# Patient Record
Sex: Male | Born: 1950
Health system: Southern US, Community
[De-identification: ages and names within clinical notes are randomized; demographics above are authoritative.]

## PROBLEM LIST (undated history)

## (undated) DIAGNOSIS — D649 Anemia, unspecified: Secondary | ICD-10-CM

## (undated) DIAGNOSIS — N189 Chronic kidney disease, unspecified: Secondary | ICD-10-CM

## (undated) DIAGNOSIS — D485 Neoplasm of uncertain behavior of skin: Secondary | ICD-10-CM

## (undated) DIAGNOSIS — F528 Other sexual dysfunction not due to a substance or known physiological condition: Secondary | ICD-10-CM

## (undated) DIAGNOSIS — R011 Cardiac murmur, unspecified: Secondary | ICD-10-CM

## (undated) DIAGNOSIS — E119 Type 2 diabetes mellitus without complications: Secondary | ICD-10-CM

## (undated) DIAGNOSIS — K219 Gastro-esophageal reflux disease without esophagitis: Secondary | ICD-10-CM

## (undated) DIAGNOSIS — M722 Plantar fascial fibromatosis: Secondary | ICD-10-CM

## (undated) DIAGNOSIS — J449 Chronic obstructive pulmonary disease, unspecified: Secondary | ICD-10-CM

## (undated) DIAGNOSIS — M1990 Unspecified osteoarthritis, unspecified site: Secondary | ICD-10-CM

## (undated) DIAGNOSIS — L259 Unspecified contact dermatitis, unspecified cause: Secondary | ICD-10-CM

## (undated) DIAGNOSIS — I10 Essential (primary) hypertension: Secondary | ICD-10-CM

## (undated) DIAGNOSIS — T881XXA Other complications following immunization, not elsewhere classified, initial encounter: Secondary | ICD-10-CM

## (undated) DIAGNOSIS — T7840XA Allergy, unspecified, initial encounter: Secondary | ICD-10-CM

## (undated) DIAGNOSIS — J45909 Unspecified asthma, uncomplicated: Secondary | ICD-10-CM

## (undated) DIAGNOSIS — J309 Allergic rhinitis, unspecified: Secondary | ICD-10-CM

## (undated) DIAGNOSIS — G4730 Sleep apnea, unspecified: Secondary | ICD-10-CM

## (undated) DIAGNOSIS — E785 Hyperlipidemia, unspecified: Secondary | ICD-10-CM

## (undated) HISTORY — PX: KNEE ARTHROSCOPY: SHX127

## (undated) HISTORY — DX: Plantar fascial fibromatosis: M72.2

## (undated) HISTORY — DX: Other complications following immunization, not elsewhere classified, initial encounter: T88.1XXA

## (undated) HISTORY — DX: Allergy, unspecified, initial encounter: T78.40XA

## (undated) HISTORY — DX: Sleep apnea, unspecified: G47.30

## (undated) HISTORY — DX: Unspecified osteoarthritis, unspecified site: M19.90

## (undated) HISTORY — DX: Unspecified asthma, uncomplicated: J45.909

## (undated) HISTORY — DX: Type 2 diabetes mellitus without complications: E11.9

## (undated) HISTORY — DX: Chronic kidney disease, unspecified: N18.9

## (undated) HISTORY — DX: Chronic obstructive pulmonary disease, unspecified: J44.9

## (undated) HISTORY — DX: Unspecified contact dermatitis, unspecified cause: L25.9

## (undated) HISTORY — DX: Neoplasm of uncertain behavior of skin: D48.5

## (undated) HISTORY — DX: Gastro-esophageal reflux disease without esophagitis: K21.9

## (undated) HISTORY — DX: Anemia, unspecified: D64.9

## (undated) HISTORY — DX: Other sexual dysfunction not due to a substance or known physiological condition: F52.8

## (undated) HISTORY — DX: Cardiac murmur, unspecified: R01.1

## (undated) HISTORY — DX: Allergic rhinitis, unspecified: J30.9

## (undated) HISTORY — DX: Essential (primary) hypertension: I10

## (undated) HISTORY — DX: Hyperlipidemia, unspecified: E78.5

## (undated) HISTORY — PX: WISDOM TOOTH EXTRACTION: SHX21

---
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
## 2005-11-21 ENCOUNTER — Encounter (INDEPENDENT_AMBULATORY_CARE_PROVIDER_SITE_OTHER): Payer: Self-pay | Admitting: Internal Medicine

## 2005-11-21 LAB — CONVERTED CEMR LAB: PSA: 0.83 ng/mL

## 2005-11-22 ENCOUNTER — Encounter (INDEPENDENT_AMBULATORY_CARE_PROVIDER_SITE_OTHER): Payer: Self-pay | Admitting: Internal Medicine

## 2006-02-23 ENCOUNTER — Encounter (INDEPENDENT_AMBULATORY_CARE_PROVIDER_SITE_OTHER): Payer: Self-pay | Admitting: Internal Medicine

## 2006-02-24 ENCOUNTER — Encounter (INDEPENDENT_AMBULATORY_CARE_PROVIDER_SITE_OTHER): Payer: Self-pay | Admitting: Internal Medicine

## 2006-03-05 ENCOUNTER — Ambulatory Visit: Payer: Self-pay | Admitting: Internal Medicine

## 2006-04-09 ENCOUNTER — Ambulatory Visit: Payer: Self-pay | Admitting: Internal Medicine

## 2006-05-21 ENCOUNTER — Ambulatory Visit: Payer: Self-pay | Admitting: Internal Medicine

## 2006-07-02 ENCOUNTER — Ambulatory Visit: Payer: Self-pay | Admitting: Internal Medicine

## 2006-07-02 LAB — CONVERTED CEMR LAB
ALT: 22 units/L
Basophils Absolute: 0 10*3/uL
Basophils Relative: 1 %
CO2: 22 meq/L
Chloride: 102 meq/L
Cholesterol: 151 mg/dL
Eosinophils Absolute: 0.2 10*3/uL
Lymphs Abs: 1.8 10*3/uL
MCV: 94.2 fL
Neutrophils Relative %: 58 %
Platelets: 261 10*3/uL
Sodium: 140 meq/L
Total Bilirubin: 0.5 mg/dL
Total Protein: 6.7 g/dL
Triglycerides: 123 mg/dL
VLDL: 25 mg/dL
WBC: 5.9 10*3/uL

## 2006-08-07 ENCOUNTER — Encounter: Payer: Self-pay | Admitting: Internal Medicine

## 2006-08-07 DIAGNOSIS — E785 Hyperlipidemia, unspecified: Secondary | ICD-10-CM | POA: Insufficient documentation

## 2006-08-07 DIAGNOSIS — E1165 Type 2 diabetes mellitus with hyperglycemia: Secondary | ICD-10-CM | POA: Insufficient documentation

## 2006-08-07 DIAGNOSIS — E119 Type 2 diabetes mellitus without complications: Secondary | ICD-10-CM

## 2006-08-07 DIAGNOSIS — I10 Essential (primary) hypertension: Secondary | ICD-10-CM

## 2006-08-07 DIAGNOSIS — M1990 Unspecified osteoarthritis, unspecified site: Secondary | ICD-10-CM | POA: Insufficient documentation

## 2006-08-07 HISTORY — DX: Type 2 diabetes mellitus without complications: E11.9

## 2006-08-07 HISTORY — DX: Unspecified osteoarthritis, unspecified site: M19.90

## 2006-08-07 HISTORY — DX: Hyperlipidemia, unspecified: E78.5

## 2006-08-07 HISTORY — DX: Essential (primary) hypertension: I10

## 2006-08-13 ENCOUNTER — Ambulatory Visit: Payer: Self-pay | Admitting: Internal Medicine

## 2006-08-13 LAB — CONVERTED CEMR LAB: Hgb A1c MFr Bld: 6 %

## 2006-09-24 ENCOUNTER — Ambulatory Visit: Payer: Self-pay | Admitting: Internal Medicine

## 2006-11-12 ENCOUNTER — Ambulatory Visit: Payer: Self-pay | Admitting: Internal Medicine

## 2006-11-12 DIAGNOSIS — M722 Plantar fascial fibromatosis: Secondary | ICD-10-CM

## 2006-11-12 DIAGNOSIS — F528 Other sexual dysfunction not due to a substance or known physiological condition: Secondary | ICD-10-CM

## 2006-11-12 DIAGNOSIS — J309 Allergic rhinitis, unspecified: Secondary | ICD-10-CM

## 2006-11-12 HISTORY — DX: Allergic rhinitis, unspecified: J30.9

## 2006-11-12 HISTORY — DX: Plantar fascial fibromatosis: M72.2

## 2006-11-12 HISTORY — DX: Other sexual dysfunction not due to a substance or known physiological condition: F52.8

## 2006-11-16 ENCOUNTER — Encounter (INDEPENDENT_AMBULATORY_CARE_PROVIDER_SITE_OTHER): Payer: Self-pay | Admitting: Internal Medicine

## 2006-11-23 ENCOUNTER — Telehealth (INDEPENDENT_AMBULATORY_CARE_PROVIDER_SITE_OTHER): Payer: Self-pay | Admitting: *Deleted

## 2007-02-09 ENCOUNTER — Encounter (INDEPENDENT_AMBULATORY_CARE_PROVIDER_SITE_OTHER): Payer: Self-pay | Admitting: Internal Medicine

## 2007-02-10 ENCOUNTER — Ambulatory Visit: Payer: Self-pay | Admitting: Internal Medicine

## 2007-02-10 LAB — CONVERTED CEMR LAB: Hgb A1c MFr Bld: 6.7 %

## 2007-03-16 ENCOUNTER — Ambulatory Visit: Payer: Self-pay | Admitting: Internal Medicine

## 2007-03-17 ENCOUNTER — Encounter (INDEPENDENT_AMBULATORY_CARE_PROVIDER_SITE_OTHER): Payer: Self-pay | Admitting: Internal Medicine

## 2007-04-14 ENCOUNTER — Ambulatory Visit: Payer: Self-pay | Admitting: Internal Medicine

## 2007-04-15 ENCOUNTER — Telehealth (INDEPENDENT_AMBULATORY_CARE_PROVIDER_SITE_OTHER): Payer: Self-pay | Admitting: *Deleted

## 2007-04-28 ENCOUNTER — Telehealth (INDEPENDENT_AMBULATORY_CARE_PROVIDER_SITE_OTHER): Payer: Self-pay | Admitting: Internal Medicine

## 2007-05-10 ENCOUNTER — Ambulatory Visit: Payer: Self-pay | Admitting: Internal Medicine

## 2007-05-10 LAB — CONVERTED CEMR LAB: Hgb A1c MFr Bld: 6 %

## 2007-06-09 ENCOUNTER — Ambulatory Visit: Payer: Self-pay | Admitting: Internal Medicine

## 2007-06-10 LAB — CONVERTED CEMR LAB
ALT: 19 units/L (ref 0–53)
AST: 17 units/L (ref 0–37)
Albumin: 4.2 g/dL (ref 3.5–5.2)
Alkaline Phosphatase: 57 units/L (ref 39–117)
Basophils Absolute: 0 10*3/uL (ref 0.0–0.1)
Basophils Relative: 1 % (ref 0–1)
Calcium: 9.1 mg/dL (ref 8.4–10.5)
Chloride: 106 meq/L (ref 96–112)
Creatinine, Ser: 1.06 mg/dL (ref 0.40–1.50)
Creatinine, Urine: 129.1 mg/dL
Eosinophils Absolute: 0.3 10*3/uL (ref 0.0–0.7)
LDL Cholesterol: 78 mg/dL (ref 0–99)
MCHC: 32.4 g/dL (ref 30.0–36.0)
Microalb, Ur: 1.81 mg/dL (ref 0.00–1.89)
Neutro Abs: 2.8 10*3/uL (ref 1.7–7.7)
Neutrophils Relative %: 55 % (ref 43–77)
Potassium: 4.3 meq/L (ref 3.5–5.3)
RDW: 13.8 % (ref 11.5–14.0)
Total CHOL/HDL Ratio: 2.9

## 2007-08-11 ENCOUNTER — Ambulatory Visit: Payer: Self-pay | Admitting: Internal Medicine

## 2007-08-11 LAB — CONVERTED CEMR LAB
Blood Glucose, Fingerstick: 128
Hgb A1c MFr Bld: 6.3 %

## 2007-08-23 ENCOUNTER — Ambulatory Visit: Payer: Self-pay | Admitting: Internal Medicine

## 2007-09-20 ENCOUNTER — Ambulatory Visit: Payer: Self-pay | Admitting: Internal Medicine

## 2007-09-22 ENCOUNTER — Telehealth (INDEPENDENT_AMBULATORY_CARE_PROVIDER_SITE_OTHER): Payer: Self-pay | Admitting: Internal Medicine

## 2007-10-04 ENCOUNTER — Ambulatory Visit: Payer: Self-pay | Admitting: Internal Medicine

## 2007-10-04 DIAGNOSIS — J019 Acute sinusitis, unspecified: Secondary | ICD-10-CM

## 2007-10-07 DIAGNOSIS — J45909 Unspecified asthma, uncomplicated: Secondary | ICD-10-CM

## 2007-10-07 HISTORY — DX: Unspecified asthma, uncomplicated: J45.909

## 2007-11-10 ENCOUNTER — Ambulatory Visit: Payer: Self-pay | Admitting: Internal Medicine

## 2007-11-11 ENCOUNTER — Encounter (INDEPENDENT_AMBULATORY_CARE_PROVIDER_SITE_OTHER): Payer: Self-pay | Admitting: Internal Medicine

## 2007-11-12 ENCOUNTER — Telehealth (INDEPENDENT_AMBULATORY_CARE_PROVIDER_SITE_OTHER): Payer: Self-pay | Admitting: *Deleted

## 2007-11-12 LAB — CONVERTED CEMR LAB
Alkaline Phosphatase: 49 units/L (ref 39–117)
CO2: 25 meq/L (ref 19–32)
Cholesterol: 136 mg/dL (ref 0–200)
Creatinine, Ser: 1.07 mg/dL (ref 0.40–1.50)
Glucose, Bld: 92 mg/dL (ref 70–99)
HDL: 62 mg/dL (ref >39)
LDL Cholesterol: 59 mg/dL (ref 0–99)
Sodium: 144 meq/L (ref 135–145)
Total Bilirubin: 0.4 mg/dL (ref 0.3–1.2)
Total CHOL/HDL Ratio: 2.2
Total Protein: 6.2 g/dL (ref 6.0–8.3)
Triglycerides: 73 mg/dL (ref <150)
VLDL: 15 mg/dL (ref 0–40)

## 2008-01-06 ENCOUNTER — Telehealth (INDEPENDENT_AMBULATORY_CARE_PROVIDER_SITE_OTHER): Payer: Self-pay | Admitting: *Deleted

## 2008-02-09 ENCOUNTER — Ambulatory Visit: Payer: Self-pay | Admitting: Internal Medicine

## 2008-02-24 ENCOUNTER — Telehealth (INDEPENDENT_AMBULATORY_CARE_PROVIDER_SITE_OTHER): Payer: Self-pay | Admitting: Internal Medicine

## 2008-03-24 ENCOUNTER — Telehealth (INDEPENDENT_AMBULATORY_CARE_PROVIDER_SITE_OTHER): Payer: Self-pay | Admitting: Internal Medicine

## 2008-04-12 ENCOUNTER — Encounter (INDEPENDENT_AMBULATORY_CARE_PROVIDER_SITE_OTHER): Payer: Self-pay | Admitting: Internal Medicine

## 2008-05-11 ENCOUNTER — Ambulatory Visit: Payer: Self-pay | Admitting: Internal Medicine

## 2008-05-11 DIAGNOSIS — J45909 Unspecified asthma, uncomplicated: Secondary | ICD-10-CM | POA: Insufficient documentation

## 2008-05-11 LAB — CONVERTED CEMR LAB: Blood Glucose, Fingerstick: 104

## 2008-05-12 LAB — CONVERTED CEMR LAB
Albumin: 4.2 g/dL (ref 3.5–5.2)
BUN: 23 mg/dL (ref 6–23)
CO2: 21 meq/L (ref 19–32)
Calcium: 8.8 mg/dL (ref 8.4–10.5)
Chloride: 105 meq/L (ref 96–112)
Cholesterol: 145 mg/dL (ref 0–200)
Creatinine, Ser: 0.91 mg/dL (ref 0.40–1.50)
Glucose, Bld: 80 mg/dL (ref 70–99)
HDL: 53 mg/dL (ref >39)
Potassium: 4.3 meq/L (ref 3.5–5.3)
Total CHOL/HDL Ratio: 2.7

## 2008-05-31 ENCOUNTER — Encounter (INDEPENDENT_AMBULATORY_CARE_PROVIDER_SITE_OTHER): Payer: Self-pay | Admitting: Internal Medicine

## 2008-06-19 ENCOUNTER — Ambulatory Visit: Payer: Self-pay | Admitting: Internal Medicine

## 2008-06-19 ENCOUNTER — Ambulatory Visit (HOSPITAL_COMMUNITY): Admission: RE | Admit: 2008-06-19 | Discharge: 2008-06-19 | Payer: Self-pay | Admitting: Internal Medicine

## 2008-06-19 DIAGNOSIS — M25539 Pain in unspecified wrist: Secondary | ICD-10-CM

## 2008-07-20 ENCOUNTER — Telehealth (INDEPENDENT_AMBULATORY_CARE_PROVIDER_SITE_OTHER): Payer: Self-pay | Admitting: *Deleted

## 2008-07-24 ENCOUNTER — Ambulatory Visit: Payer: Self-pay | Admitting: Internal Medicine

## 2008-08-09 ENCOUNTER — Ambulatory Visit: Payer: Self-pay | Admitting: Internal Medicine

## 2008-08-09 LAB — CONVERTED CEMR LAB: Blood Glucose, Fingerstick: 187

## 2008-08-11 ENCOUNTER — Encounter (INDEPENDENT_AMBULATORY_CARE_PROVIDER_SITE_OTHER): Payer: Self-pay | Admitting: Internal Medicine

## 2008-08-11 LAB — CONVERTED CEMR LAB
Creatinine, Urine: 33.6 mg/dL
Microalb Creat Ratio: 7.1 mg/g (ref 0.0–30.0)

## 2008-09-14 ENCOUNTER — Encounter (INDEPENDENT_AMBULATORY_CARE_PROVIDER_SITE_OTHER): Payer: Self-pay | Admitting: Internal Medicine

## 2008-11-08 ENCOUNTER — Ambulatory Visit: Payer: Self-pay | Admitting: Internal Medicine

## 2008-11-08 DIAGNOSIS — L259 Unspecified contact dermatitis, unspecified cause: Secondary | ICD-10-CM

## 2008-11-08 HISTORY — DX: Unspecified contact dermatitis, unspecified cause: L25.9

## 2008-11-08 LAB — CONVERTED CEMR LAB: Blood Glucose, Fingerstick: 121

## 2008-11-13 ENCOUNTER — Encounter (INDEPENDENT_AMBULATORY_CARE_PROVIDER_SITE_OTHER): Payer: Self-pay | Admitting: Internal Medicine

## 2008-11-14 LAB — CONVERTED CEMR LAB
AST: 15 units/L (ref 0–37)
Albumin: 3.9 g/dL (ref 3.5–5.2)
Alkaline Phosphatase: 57 units/L (ref 39–117)
BUN: 23 mg/dL (ref 6–23)
Glucose, Bld: 88 mg/dL (ref 70–99)
HDL: 65 mg/dL (ref >39)
LDL Cholesterol: 74 mg/dL (ref 0–99)
Potassium: 4.3 meq/L (ref 3.5–5.3)
Sodium: 143 meq/L (ref 135–145)
Total Bilirubin: 0.3 mg/dL (ref 0.3–1.2)
Total Protein: 6.4 g/dL (ref 6.0–8.3)
Triglycerides: 66 mg/dL (ref <150)
VLDL: 13 mg/dL (ref 0–40)

## 2009-02-07 ENCOUNTER — Ambulatory Visit: Payer: Self-pay | Admitting: Internal Medicine

## 2009-02-07 DIAGNOSIS — D485 Neoplasm of uncertain behavior of skin: Secondary | ICD-10-CM

## 2009-02-07 HISTORY — DX: Neoplasm of uncertain behavior of skin: D48.5

## 2009-02-07 LAB — CONVERTED CEMR LAB: Hgb A1c MFr Bld: 6.3 %

## 2009-02-19 ENCOUNTER — Encounter (INDEPENDENT_AMBULATORY_CARE_PROVIDER_SITE_OTHER): Payer: Self-pay | Admitting: Internal Medicine

## 2009-05-09 ENCOUNTER — Ambulatory Visit: Payer: Self-pay | Admitting: Internal Medicine

## 2009-05-18 LAB — CONVERTED CEMR LAB
Alkaline Phosphatase: 54 units/L (ref 39–117)
CO2: 27 meq/L (ref 19–32)
Cholesterol: 143 mg/dL (ref 0–200)
Creatinine, Ser: 1 mg/dL (ref 0.40–1.50)
Glucose, Bld: 84 mg/dL (ref 70–99)
HDL: 53 mg/dL (ref >39)
LDL Cholesterol: 75 mg/dL (ref 0–99)
Total Bilirubin: 0.4 mg/dL (ref 0.3–1.2)
Total CHOL/HDL Ratio: 2.7
Triglycerides: 74 mg/dL (ref <150)
VLDL: 15 mg/dL (ref 0–40)

## 2009-06-08 ENCOUNTER — Encounter (INDEPENDENT_AMBULATORY_CARE_PROVIDER_SITE_OTHER): Payer: Self-pay | Admitting: Internal Medicine

## 2009-06-20 ENCOUNTER — Ambulatory Visit: Payer: Self-pay | Admitting: Family Medicine

## 2009-08-20 ENCOUNTER — Encounter (INDEPENDENT_AMBULATORY_CARE_PROVIDER_SITE_OTHER): Payer: Self-pay | Admitting: *Deleted

## 2009-09-18 ENCOUNTER — Ambulatory Visit: Payer: Self-pay | Admitting: Family Medicine

## 2009-11-20 ENCOUNTER — Telehealth: Payer: Self-pay | Admitting: Family Medicine

## 2010-01-08 ENCOUNTER — Telehealth: Payer: Self-pay | Admitting: Family Medicine

## 2010-02-12 ENCOUNTER — Encounter: Payer: Self-pay | Admitting: Family Medicine

## 2010-03-18 ENCOUNTER — Ambulatory Visit: Payer: Self-pay | Admitting: Family Medicine

## 2010-04-05 ENCOUNTER — Ambulatory Visit: Payer: Self-pay | Admitting: Family Medicine

## 2010-04-10 LAB — CONVERTED CEMR LAB
Bilirubin, Direct: 0.1 mg/dL (ref 0.0–0.3)
CO2: 28 meq/L (ref 19–32)
Calcium: 8.7 mg/dL (ref 8.4–10.5)
Creatinine, Ser: 1 mg/dL (ref 0.4–1.5)
Creatinine,U: 115.2 mg/dL
GFR calc non Af Amer: 79.07 mL/min (ref >60)
HDL: 45.9 mg/dL (ref >39.00)
LDL Cholesterol: 75 mg/dL (ref 0–99)
Microalb Creat Ratio: 2.6 mg/g (ref 0.0–30.0)
Microalb, Ur: 3 mg/dL — ABNORMAL HIGH (ref 0.0–1.9)
Total Bilirubin: 0.6 mg/dL (ref 0.3–1.2)
Total CHOL/HDL Ratio: 3
Total Protein: 6.4 g/dL (ref 6.0–8.3)
Triglycerides: 79 mg/dL (ref 0.0–149.0)
VLDL: 15.8 mg/dL (ref 0.0–40.0)

## 2010-08-08 ENCOUNTER — Encounter: Payer: Self-pay | Admitting: Family Medicine

## 2010-08-14 ENCOUNTER — Encounter: Payer: Self-pay | Admitting: Family Medicine

## 2010-09-05 LAB — HM DIABETES FOOT EXAM

## 2010-09-09 ENCOUNTER — Ambulatory Visit: Payer: Self-pay | Admitting: Family Medicine

## 2010-09-09 DIAGNOSIS — T881XXA Other complications following immunization, not elsewhere classified, initial encounter: Secondary | ICD-10-CM | POA: Insufficient documentation

## 2010-09-09 HISTORY — DX: Other complications following immunization, not elsewhere classified, initial encounter: T88.1XXA

## 2010-09-11 ENCOUNTER — Telehealth: Payer: Self-pay | Admitting: Family Medicine

## 2010-09-11 LAB — CONVERTED CEMR LAB: Rubella: >500 intl units/mL — ABNORMAL HIGH

## 2010-09-12 ENCOUNTER — Ambulatory Visit: Payer: Self-pay | Admitting: Family Medicine

## 2010-09-16 ENCOUNTER — Telehealth: Payer: Self-pay | Admitting: Family Medicine

## 2010-11-05 NOTE — Assessment & Plan Note (Signed)
Summary: 6 month follow up/cjr---PT RSC (BMP) // RS   Vital Signs:  Patient profile:   60 year old male Weight:      249 pounds Temp:     97.7 degrees F oral BP sitting:   130 / 64  (left arm) Cuff size:   large  Vitals Entered By: Sid Falcon LPN (March 18, 2010 8:27 AM)  History of Present Illness: Here for multi-problem follow up.  Diabetes stable.  Reg eye exams.  No hx of retinopathy. Not monitoring CBGs regularly.  Last A1C 6.2%.  No hypoglycemia.  Diabetes Management History:      The patient is a 60 years old male who comes in for evaluation of DM Type 2.  He has not been enrolled in the "Diabetic Education Program".  He states understanding of dietary principles and is following his diet appropriately.  No sensory loss is reported.  Self foot exams are being performed.  He is checking home blood sugars.  He says that he is not exercising regularly.        Hypoglycemic symptoms are not occurring.  No hyperglycemic symptoms are reported.    Hypertension History:      He complains of peripheral edema, but denies headache, chest pain, palpitations, dyspnea with exertion, orthopnea, PND, neurologic problems, syncope, and side effects from treatment.        Positive major cardiovascular risk factors include male age 42 years old or older, diabetes, hyperlipidemia, and hypertension.  Negative major cardiovascular risk factors include non-tobacco-user status.        Further assessment for target organ damage reveals no history of ASHD, stroke/TIA, or peripheral vascular disease.    Lipid Management History:      Positive NCEP/ATP III risk factors include male age 37 years old or older, diabetes, and hypertension.  Negative NCEP/ATP III risk factors include non-tobacco-user status, no ASHD (atherosclerotic heart disease), no prior stroke/TIA, no peripheral vascular disease, and no history of aortic aneurysm.      Allergies: 1)  Tetracycline  Past History:  Past Medical  History: Last updated: 06/20/2009 Diabetes mellitus, type II, controlled, no complications Hyperlipidemia Hypertension Osteoarthritis Allergic rhinitis recurrent wheezing/? asthma recurrent plantar fasciitis Glaucoma suspect Heart murmur PMH reviewed for relevance  Review of Systems  The patient denies anorexia, fever, vision loss, decreased hearing, chest pain, syncope, dyspnea on exertion, peripheral edema, prolonged cough, headaches, hemoptysis, abdominal pain, melena, hematochezia, severe indigestion/heartburn, and muscle weakness.    Physical Exam  General:  Well-developed,well-nourished,in no acute distress; alert,appropriate and cooperative throughout examination Head:  Normocephalic and atraumatic without obvious abnormalities. No apparent alopecia or balding. Eyes:  No corneal or conjunctival inflammation noted. EOMI. Perrla. Funduscopic exam benign, without hemorrhages, exudates or papilledema. Vision grossly normal. Mouth:  Oral mucosa and oropharynx without lesions or exudates.  Teeth in good repair. Neck:  No deformities, masses, or tenderness noted. Lungs:  Normal respiratory effort, chest expands symmetrically. Lungs are clear to auscultation, no crackles or wheezes. Heart:  Normal rate and regular rhythm. S1 and S2 normal without gallop, murmur, click, rub or other extra sounds. Extremities:  No clubbing, cyanosis, edema, or deformity noted with normal full range of motion of all joints.    Diabetes Management Exam:    Foot Exam (with socks and/or shoes not present):       Sensory-Pinprick/Light touch:          Left medial foot (L-4): normal          Left dorsal  foot (L-5): normal          Left lateral foot (S-1): normal          Right medial foot (L-4): normal          Right dorsal foot (L-5): normal          Right lateral foot (S-1): normal       Sensory-Monofilament:          Left foot: normal          Right foot: normal       Inspection:          Left  foot: normal          Right foot: normal       Nails:          Left foot: normal          Right foot: normal    Eye Exam:       Eye Exam done elsewhere          Date: 02/06/2010          Results: normal   Impression & Recommendations:  Problem # 1:  DIABETES MELLITUS, TYPE II (ICD-250.00) schedule labs.  Discussed possible change Avandia to Actos and pt not interested. His updated medication list for this problem includes:    Avandamet 01-999 Mg Tabs (Rosiglitazone-metformin) .Marland Kitchen... 1 by mouth two times a day    Zestoretic 20-25 Mg Tabs (Lisinopril-hydrochlorothiazide) ..... Once daily    Aspirin 81 Mg Tbec (Aspirin) ..... Once daily    Exforge 10-320 Mg Tabs (Amlodipine besylate-valsartan) .Marland Kitchen... 1 by mouth once daily    Glipizide Xl 5 Mg Xr24h-tab (Glipizide) .Marland Kitchen... 1 by mouth once daily  Problem # 2:  HYPERLIPIDEMIA (ICD-272.4)  His updated medication list for this problem includes:    Lipitor 20 Mg Tabs (Atorvastatin calcium) ..... Once daily  Problem # 3:  HYPERTENSION (ICD-401.9)  His updated medication list for this problem includes:    Zestoretic 20-25 Mg Tabs (Lisinopril-hydrochlorothiazide) ..... Once daily    Exforge 10-320 Mg Tabs (Amlodipine besylate-valsartan) .Marland Kitchen... 1 by mouth once daily    Doxazosin Mesylate 4 Mg Tabs (Doxazosin mesylate) .Marland Kitchen... 1 by mouth at bedtime  Complete Medication List: 1)  Avandamet 01-999 Mg Tabs (Rosiglitazone-metformin) .Marland Kitchen.. 1 by mouth two times a day 2)  Lipitor 20 Mg Tabs (Atorvastatin calcium) .... Once daily 3)  Zestoretic 20-25 Mg Tabs (Lisinopril-hydrochlorothiazide) .... Once daily 4)  Aspirin 81 Mg Tbec (Aspirin) .... Once daily 5)  Iron 25 Mg Tabs (Iron) .... Once daily 6)  Daily Multi-vitamins/iron Tabs (Multiple vitamins-iron) .... Once daily 7)  Exforge 10-320 Mg Tabs (Amlodipine besylate-valsartan) .Marland Kitchen.. 1 by mouth once daily 8)  Doxazosin Mesylate 4 Mg Tabs (Doxazosin mesylate) .Marland Kitchen.. 1 by mouth at bedtime 9)  Advair Diskus  250-50 Mcg/dose Misc (Fluticasone-salmeterol) .Marland Kitchen.. 1 puff two times a day 10)  Glipizide Xl 5 Mg Xr24h-tab (Glipizide) .Marland Kitchen.. 1 by mouth once daily 11)  Allerx Df 4-2.5 & 8-2.5 Mg Misc (Chlorpheniramine-methscop) .... One tab two times a day 12)  Omnaris 50 Mcg/act Susp (Ciclesonide) .... One spray to each nostril in the am and pm  Diabetes Management Assessment/Plan:      The following lipid goals have been established for the patient: Total cholesterol goal of 200; LDL cholesterol goal of 100; HDL cholesterol goal of 40; Triglyceride goal of 150.    Hypertension Assessment/Plan:      The patient's hypertensive risk group is category C:  Target organ damage and/or diabetes.  His calculated 10 year risk of coronary heart disease is 11 %.  Today's blood pressure is 130/64.    Lipid Assessment/Plan:      Based on NCEP/ATP III, the patient's risk factor category is "history of diabetes".  The patient's lipid goals are as follows: Total cholesterol goal is 200; LDL cholesterol goal is 100; HDL cholesterol goal is 40; Triglyceride goal is 150.    Patient Instructions: 1)  Schedule the following labs for July: 2)  Lipid, hepatic  272.4 3)  A1C, urine microalbumin  250.00 4)  BMP  401.9 5)  Please schedule a follow-up appointment in 6 months .  6)  Check your blood sugars regularly. If your readings are usually above:140  or below 70 you should contact our office.  7)  It is important that your diabetic A1c level is checked every 3 months.  8)  See your eye doctor yearly to check for diabetic eye damage. 9)  Check your feet each night  for sore areas, calluses or signs of infection.  Prescriptions: GLIPIZIDE XL 5 MG XR24H-TAB (GLIPIZIDE) 1 by mouth once daily  #90 x 3   Entered and Authorized by:   Evelena Peat MD   Signed by:   Evelena Peat MD on 03/18/2010   Method used:   Electronically to        [facility]* (retail)       [address] [place], [place]  [address]        Ph: [phone] or [phone]       Fax: [phone]   RxID:   [id number] DOXAZOSIN MESYLATE 4 MG TABS (DOXAZOSIN MESYLATE) 1 by mouth at bedtime  #90 x 3   Entered and Authorized by:   Evelena Peat MD   Signed by:   Evelena Peat MD on 03/18/2010   Method used:   Electronically to        [facility]* (retail)       [address] [place], [place]  [address]       Ph: [phone] or [phone]       Fax: [phone]   RxID:   [id number] EXFORGE 10-320 MG TABS (AMLODIPINE BESYLATE-VALSARTAN) 1 by mouth once daily  #90 x 3   Entered and Authorized by:   Evelena Peat MD   Signed by:   Evelena Peat MD on 03/18/2010   Method used:   Electronically to        [facility]* (retail)       [address] [place], [place]  [address]       Ph: [phone] or [phone]       Fax: [phone]   RxID:   [id number] ZESTORETIC 20-25 MG TABS (LISINOPRIL-HYDROCHLOROTHIAZIDE) once daily  #90 x 3   Entered and Authorized by:   Evelena Peat MD   Signed by:   Evelena Peat MD on 03/18/2010   Method used:   Electronically to        [facility]* (retail)       [address] [place], [place]  [address]       Ph: [phone] or [phone]       Fax: [phone]   RxID:   [id number] LIPITOR 20 MG TABS (ATORVASTATIN CALCIUM) once daily  #90 x 3   Entered and Authorized by:  Evelena Peat MD   Signed by:   Evelena Peat MD on 03/18/2010   Method used:   Electronically to        [facility]* (retail)       [address] [place], [place]  [address]       Ph: [phone] or [phone]       Fax: [phone]   RxID:   [id number] AVANDAMET 01-999 MG  TABS (ROSIGLITAZONE-METFORMIN) 1 by mouth two times a day  #180 x 3   Entered and Authorized by:   Evelena Peat MD   Signed by:   Evelena Peat MD on 03/18/2010   Method used:   Electronically to        [facility]* (retail)        [address] [place], [place]  [address]       Ph: [phone] or [phone]       Fax: [phone]   RxID:   [id number]

## 2010-11-05 NOTE — Letter (Signed)
Summary: [facility]  [facility]   Imported By: Maryln Gottron 03/06/2010 15:41:35  _____________________________________________________________________  External Attachment:    Type:   Image     Comment:   External Document

## 2010-11-05 NOTE — Letter (Signed)
Summary: TB Skin Test  All     ,     Phone:   Fax:           TB Skin Test    Richard Dalton    Date TB Test Placed:  ________________  L or R forearm  TB Test Placed by:  ___________________  Lot #:  __________________        Expiration Date: _____________  Date TB Test Read:  ____________________    Result ___________MM  TB Test Read by:  _______________

## 2010-11-05 NOTE — Letter (Signed)
Summary: [facility]   Imported By: Maryln Gottron 09/11/2010 08:51:24  _____________________________________________________________________  External Attachment:    Type:   Image     Comment:   External Document

## 2010-11-05 NOTE — Progress Notes (Signed)
Summary: REFILL Lipitor X 3 months, will need OV  Phone Note Refill Request Message from:  Fax from Pharmacy  Refills Requested: Medication #1:  LIPITOR 20 MG TABS once daily [facility] [phone]     FAX---[phone]  Initial call taken by: Warnell Forester,  January 08, 2010 10:04 AM    Prescriptions: LIPITOR 20 MG TABS (ATORVASTATIN CALCIUM) once daily  #90 x 0   Entered by:   Sid Falcon LPN   Authorized by:   Evelena Peat MD   Signed by:   Sid Falcon LPN on 40/98/1191   Method used:   Electronically to        [facility]* (retail)       [address] [place], [place]  [address]       Ph: [phone] or [phone]       Fax: [phone]   RxID:   [id number]

## 2010-11-05 NOTE — Letter (Signed)
Summary: [facility]  [facility]   Imported By: Maryln Gottron 08/28/2010 14:22:48  _____________________________________________________________________  External Attachment:    Type:   Image     Comment:   External Document

## 2010-11-05 NOTE — Miscellaneous (Signed)
Summary: TB neg  Clinical Lists Changes  Observations: Added new observation of TB PPDRESULT: negative (09/12/2010 12:07) Added new observation of PPD RESULT: < 5mm (09/12/2010 12:07) Added new observation of TB-PPD RDDTE: 09/12/2010 (09/12/2010 12:07)      PPD Results    Date of reading: 09/12/2010    Results: < 5mm    Interpretation: negative Pt called from [facility], he is there working all day and it would be hard to get away to come to office for TB test read.  Per Dr Caryl Never, OK to have a nurse at [facility] read and give verbal report. Per Timoteo Ace RN, PPD neg.  Pt requested PPD report be mailed to his home, done. Sid Falcon LPN  September 12, 2010 12:10 PM

## 2010-11-05 NOTE — Progress Notes (Signed)
Summary: REFILL Avandamet refill X 6 months  Phone Note Refill Request Message from:  Fax from Pharmacy  Refills Requested: Medication #1:  AVANDAMET 01-999 MG  TABS 1 by mouth two times a day [facility] [phone]    FAX---[phone]  Initial call taken by: Warnell Forester,  November 20, 2009 11:15 AM    Prescriptions: AVANDAMET 01-999 MG  TABS (ROSIGLITAZONE-METFORMIN) 1 by mouth two times a day  #60 Tablet x 6   Entered by:   Sid Falcon LPN   Authorized by:   Evelena Peat MD   Signed by:   Sid Falcon LPN on 40/98/1191   Method used:   Electronically to        [facility]* (retail)       [address] [place], [place]  [address]       Ph: [phone] or [phone]       Fax: [phone]   RxID:   [id number]

## 2010-11-05 NOTE — Assessment & Plan Note (Signed)
Summary: Follow up/cb   Vital Signs:  Patient profile:   60 year old male Weight:      249 pounds Temp:     98.1 degrees F oral BP sitting:   140 / 60  (left arm) Cuff size:   large  Vitals Entered By: Sid Falcon LPN (September 09, 2010 8:34 AM)  History of Present Illness: Patient seen for followup multiple medical problems.  Type 2 diabetes. Well controlled. Last A1c 6 range. Blood sugar stable. Patient remains on Avandamet and reluctant to change. No side effects from medications. Last eye exam in November with question of early glaucoma.  Patient started a new job recently. Needs MMR titer. No proof of prior vaccination. Also needs PPD placed.  No prior hx of positive reaction.  Hyperlipidemia treated with Lipitor. No side effects. No history of CAD.  Hypertension. Meds reviewed. Blood pressure been well-controlled  Hypertension History:      He denies headache, chest pain, palpitations, dyspnea with exertion, orthopnea, PND, peripheral edema, visual symptoms, neurologic problems, syncope, and side effects from treatment.  He notes no problems with any antihypertensive medication side effects.        Positive major cardiovascular risk factors include male age 24 years old or older, diabetes, hyperlipidemia, and hypertension.  Negative major cardiovascular risk factors include negative family history for ischemic heart disease and non-tobacco-user status.        Further assessment for target organ damage reveals no history of ASHD, stroke/TIA, or peripheral vascular disease.    Lipid Management History:      Positive NCEP/ATP III risk factors include male age 71 years old or older, diabetes, and hypertension.  Negative NCEP/ATP III risk factors include no family history for ischemic heart disease, non-tobacco-user status, no ASHD (atherosclerotic heart disease), no prior stroke/TIA, no peripheral vascular disease, and no history of aortic aneurysm.      Allergies: 1)   Tetracycline  Past History:  Past Medical History: Last updated: 06/20/2009 Diabetes mellitus, type II, controlled, no complications Hyperlipidemia Hypertension Osteoarthritis Allergic rhinitis recurrent wheezing/? asthma recurrent plantar fasciitis Glaucoma suspect Heart murmur  Past Surgical History: Last updated: 06/20/2009 REMOVAL OF CYST ON BACK ORTHROSCOPIC SURGERY ON RIGHT KNEE Tonsillectomy/adenoidectomy  1955 wisdom teeth  Family History: Last updated: 12/08/06 Father died at 37 w/ alzheimers Mother died at 43 with DM, MVA 4 sisters- 1 w/ DM 1 brother 2 daughters  Social History: Last updated: 08/11/2007 Occupation: deck builder/second job at [employer] Married lives with wife Former Smoker quit 1988, previous 2 ppd for 15 years Alcohol use-yes 0-2 drinks, 2-4 times per month  Risk Factors: Exercise: no (2006-12-08)  Risk Factors: Smoking Status: quit (2006/12/08) Passive Smoke Exposure: no (12/08/2006) PMH-FH-SH reviewed for relevance  Review of Systems      See HPI  Physical Exam  General:  Well-developed,well-nourished,in no acute distress; alert,appropriate and cooperative throughout examination Head:  Normocephalic and atraumatic without obvious abnormalities. No apparent alopecia or balding. Ears:  moderate cerumen right canal was normal Mouth:  Oral mucosa and oropharynx without lesions or exudates.  Teeth in good repair. Neck:  No deformities, masses, or tenderness noted. Lungs:  Normal respiratory effort, chest expands symmetrically. Lungs are clear to auscultation, no crackles or wheezes. Heart:  normal rate, regular rhythm, and no murmur.   Skin:  no rashes and no suspicious lesions.   Cervical Nodes:  No lymphadenopathy noted Psych:  normally interactive, good eye contact, not anxious appearing, and not depressed appearing.  Diabetes Management Exam:    Foot Exam (with socks and/or shoes not present):        Sensory-Pinprick/Light touch:          Left medial foot (L-4): normal          Left dorsal foot (L-5): normal          Left lateral foot (S-1): normal          Right medial foot (L-4): normal          Right dorsal foot (L-5): normal          Right lateral foot (S-1): normal       Sensory-Monofilament:          Left foot: normal          Right foot: normal       Inspection:          Left foot: normal          Right foot: normal       Nails:          Left foot: normal          Right foot: normal    Eye Exam:       Eye Exam done elsewhere          Date: 08/07/2010          Results: normal          Done by: Dr Lita Mains   Impression & Recommendations:  Problem # 1:  HYPERTENSION (ICD-401.9)  His updated medication list for this problem includes:    Zestoretic 20-25 Mg Tabs (Lisinopril-hydrochlorothiazide) ..... Once daily    Exforge 10-320 Mg Tabs (Amlodipine besylate-valsartan) .Marland Kitchen... 1 by mouth once daily    Doxazosin Mesylate 4 Mg Tabs (Doxazosin mesylate) .Marland Kitchen... 1 by mouth at bedtime  Problem # 2:  DIABETES MELLITUS, TYPE II (ICD-250.00)  His updated medication list for this problem includes:    Avandamet 01-999 Mg Tabs (Rosiglitazone-metformin) .Marland Kitchen... 1 by mouth two times a day    Zestoretic 20-25 Mg Tabs (Lisinopril-hydrochlorothiazide) ..... Once daily    Aspirin 81 Mg Tbec (Aspirin) ..... Once daily    Exforge 10-320 Mg Tabs (Amlodipine besylate-valsartan) .Marland Kitchen... 1 by mouth once daily    Glipizide Xl 5 Mg Xr24h-tab (Glipizide) .Marland Kitchen... 1 by mouth once daily  Orders: TLB-A1C / Hgb A1C (Glycohemoglobin) (83036-A1C)  Problem # 3:  HYPERLIPIDEMIA (ICD-272.4)  His updated medication list for this problem includes:    Lipitor 20 Mg Tabs (Atorvastatin calcium) ..... Once daily  Problem # 4:  GENERALIZED VACCINIA AS COMP MEDICAL CARE NEC (ICD-999.0) check MMR antibodies as required by work.  Flu vaccine already given. Orders: T-Measles (Rubeola) Antibody IgG  ([id number]) T-Mumps Virus Antibody, IgM ([id number]) T-Rubella Antibody ([id number])  Complete Medication List: 1)  Avandamet 01-999 Mg Tabs (Rosiglitazone-metformin) .Marland Kitchen.. 1 by mouth two times a day 2)  Lipitor 20 Mg Tabs (Atorvastatin calcium) .... Once daily 3)  Zestoretic 20-25 Mg Tabs (Lisinopril-hydrochlorothiazide) .... Once daily 4)  Aspirin 81 Mg Tbec (Aspirin) .... Once daily 5)  Iron 25 Mg Tabs (Iron) .... Once daily 6)  Daily Multi-vitamins/iron Tabs (Multiple vitamins-iron) .... Once daily 7)  Exforge 10-320 Mg Tabs (Amlodipine besylate-valsartan) .Marland Kitchen.. 1 by mouth once daily 8)  Doxazosin Mesylate 4 Mg Tabs (Doxazosin mesylate) .Marland Kitchen.. 1 by mouth at bedtime 9)  Advair Diskus 250-50 Mcg/dose Misc (Fluticasone-salmeterol) .Marland Kitchen.. 1 puff two times a day 10)  Glipizide Xl 5 Mg Xr24h-tab (Glipizide) .Marland KitchenMarland KitchenMarland Kitchen 1  by mouth once daily 11)  Allerx Df 4-2.5 & 8-2.5 Mg Misc (Chlorpheniramine-methscop) .... One tab two times a day 12)  Omnaris 50 Mcg/act Susp (Ciclesonide) .... One spray to each nostril in the am and pm 13)  Patanase 0.6 % Soln (Olopatadine hcl) .... One spray to each nostril 2 times daily  Hypertension Assessment/Plan:      The patient's hypertensive risk group is category C: Target organ damage and/or diabetes.  His calculated 10 year risk of coronary heart disease is 14 %.  Today's blood pressure is 140/60.    Lipid Assessment/Plan:      Based on NCEP/ATP III, the patient's risk factor category is "history of diabetes".  The patient's lipid goals are as follows: Total cholesterol goal is 200; LDL cholesterol goal is 100; HDL cholesterol goal is 40; Triglyceride goal is 150.    Patient Instructions: 1)  Please schedule a follow-up appointment in 6 months .  2)  It is important that you exercise reguarly at least 20 minutes 5 times a week. If you develop chest pain, have severe difficulty breathing, or feel very tired, stop exercising immediately and seek medical attention.  3)   You need to lose weight. Consider a lower calorie diet and regular exercise.    Orders Added: 1)  T-Measles (Rubeola) Antibody IgG [[id number]] 2)  T-Mumps Virus Antibody, IgM [[id number]] 3)  T-Rubella Antibody [[id number]] 4)  TLB-A1C / Hgb A1C (Glycohemoglobin) [83036-A1C] 5)  Est. Patient Level IV [57846]   Immunizations Administered:  PPD Skin Test:    Vaccine Type: PPD    Site: left forearm    Mfr: Sanofi Pasteur    Dose: 0.1 ml    Route: ID    Given by: Sid Falcon LPN    Exp. Date: 08/08/2011    Lot #: [id number]   Immunizations Administered:  PPD Skin Test:    Vaccine Type: PPD    Site: left forearm    Mfr: Sanofi Pasteur    Dose: 0.1 ml    Route: ID    Given by: Sid Falcon LPN    Exp. Date: 08/08/2011    Lot #: [id number]  Influenza Vaccine (to be given today)

## 2010-11-05 NOTE — Progress Notes (Signed)
Summary: Pt called and said fax did not go through. Pls resend labs  Phone Note Call from Patient Call back at [facility] Phone [phone] Call back at Work Phone [phone]   Caller: Patient Summary of Call: Pt called and said that fax did not go through. Pls refax labs to [phone].   Initial call taken by: Lucy Antigua,  September 11, 2010 8:12 AM  Follow-up for Phone Call        Labs will be mailed, no faxed, pt informed on home VM Follow-up by: Sid Falcon LPN,  September 11, 2010 9:28 AM

## 2010-11-07 NOTE — Progress Notes (Signed)
Summary: Pt req alternative med for Avandamet be called in asap  Phone Note Call from Patient Call back at Home Phone [phone] Call back at Work Phone [phone]   Caller: Patient Summary of Call: Pt called and said that he is almost out of Avandamet. Pt says that this med has been taken off market and is no longer available. Needs an alternative med called in to [facility] at [address]. Pls call in asap. Pt only has enough med for a day and a half.    Initial call taken by: Lucy Antigua,  September 16, 2010 9:20 AM  Follow-up for Phone Call        change to ActoPlus met 15/1,000 mg one by mouth two times a day  refill for 6 months. Follow-up by: Evelena Peat MD,  September 16, 2010 1:16 PM  Additional Follow-up for Phone Call Additional follow up Details #1::        Pt informed Additional Follow-up by: Sid Falcon LPN,  September 16, 2010 5:27 PM    New/Updated Medications: ACTOPLUS MET XR 15-1000 MG XR24H-TAB (PIOGLITAZONE HCL-METFORMIN HCL) one tab by mouth daily Prescriptions: ACTOPLUS MET XR 15-1000 MG XR24H-TAB (PIOGLITAZONE HCL-METFORMIN HCL) one tab by mouth daily  #30 x 6   Entered by:   Sid Falcon LPN   Authorized by:   Evelena Peat MD   Signed by:   Sid Falcon LPN on 29/56/2130   Method used:   Electronically to        [facility] [address] [phone]* (retail)       [address] [place], [place]  [address]       Ph: [phone]       Fax: [phone]   RxID:   [id number]

## 2011-01-08 LAB — HM DIABETES EYE EXAM

## 2011-01-31 ENCOUNTER — Other Ambulatory Visit: Payer: Self-pay | Admitting: Family Medicine

## 2011-03-02 ENCOUNTER — Other Ambulatory Visit: Payer: Self-pay | Admitting: Family Medicine

## 2011-03-07 ENCOUNTER — Encounter: Payer: Self-pay | Admitting: Family Medicine

## 2011-03-10 ENCOUNTER — Ambulatory Visit (INDEPENDENT_AMBULATORY_CARE_PROVIDER_SITE_OTHER): Payer: BC Managed Care – PPO | Admitting: Family Medicine

## 2011-03-10 ENCOUNTER — Encounter: Payer: Self-pay | Admitting: Family Medicine

## 2011-03-10 DIAGNOSIS — E785 Hyperlipidemia, unspecified: Secondary | ICD-10-CM

## 2011-03-10 DIAGNOSIS — M1990 Unspecified osteoarthritis, unspecified site: Secondary | ICD-10-CM

## 2011-03-10 DIAGNOSIS — E119 Type 2 diabetes mellitus without complications: Secondary | ICD-10-CM

## 2011-03-10 DIAGNOSIS — I10 Essential (primary) hypertension: Secondary | ICD-10-CM

## 2011-03-10 LAB — HEPATIC FUNCTION PANEL
Albumin: 3.8 g/dL (ref 3.5–5.2)
Alkaline Phosphatase: 64 U/L (ref 39–117)
Total Protein: 6.1 g/dL (ref 6.0–8.3)

## 2011-03-10 LAB — LIPID PANEL
Cholesterol: 136 mg/dL (ref 0–200)
LDL Cholesterol: 57 mg/dL (ref 0–99)
Triglycerides: 134 mg/dL (ref 0.0–149.0)
VLDL: 26.8 mg/dL (ref 0.0–40.0)

## 2011-03-10 LAB — HEMOGLOBIN A1C: Hgb A1c MFr Bld: 6.6 % — ABNORMAL HIGH (ref 4.6–6.5)

## 2011-03-10 LAB — BASIC METABOLIC PANEL
BUN: 21 mg/dL (ref 6–23)
Chloride: 104 mEq/L (ref 96–112)
Potassium: 4 mEq/L (ref 3.5–5.1)

## 2011-03-10 MED ORDER — PIOGLITAZONE HCL-METFORMIN ER 15-1000 MG PO TB24: 1.0000 | ORAL_TABLET | ORAL | Status: DC

## 2011-03-10 NOTE — Progress Notes (Signed)
  Subjective:    Patient ID: Richard Dalton, male    DOB: 1951/02/16, 60 y.o.   MRN: [id number]  HPI Patient seen for medical followup. His chronic problems include history of obesity, type 2 diabetes, hyperlipidemia, and hypertension. He has some generalized osteoarthritis. Medications are reviewed. Compliant with all. Needs refills of Actos plus met.  Blood sugars have been generally well controlled. No history of diabetic retinopathy. Recent eye exam normal. No recent hypoglycemic symptoms.  No consistent exercise. Walks some for exercise. No orthostasis. Denies any dizziness, headaches, peripheral edema, or chest pain.   Review of Systems  Constitutional: Positive for fatigue. Negative for fever, chills, activity change, appetite change and unexpected weight change.  Respiratory: Negative for cough and shortness of breath.   Cardiovascular: Negative for chest pain, palpitations and leg swelling.  Gastrointestinal: Negative for abdominal pain.  Genitourinary: Negative for dysuria.  Neurological: Negative for dizziness, light-headedness and headaches.  Hematological: Negative for adenopathy.       Objective:   Physical Exam  Constitutional: He is oriented to person, place, and time. He appears well-developed and well-nourished. No distress.  HENT:  Mouth/Throat: Oropharynx is clear and moist.  Neck: Neck supple. No thyromegaly present.  Cardiovascular: Normal rate and regular rhythm.   Pulmonary/Chest: Effort normal and breath sounds normal. No respiratory distress. He has no wheezes. He has no rales.  Musculoskeletal: He exhibits no edema.       Feet reveal no skin lesions. Good distal foot pulses. Good capillary refill. No calluses. Normal sensation with monofilament testing  Lymphadenopathy:    He has no cervical adenopathy.  Neurological: He is alert and oriented to person, place, and time. No cranial nerve deficit.  Psychiatric: He has a normal mood and affect.           Assessment & Plan:  #1 type 2 diabetes. History of excellent control. Recheck A1c today #2 hypertension well controlled continue current medications #3 hyperlipidemia repeat lipid panel and hepatic panel today

## 2011-03-11 NOTE — Progress Notes (Signed)
Quick Note:  Pt informed ______ 

## 2011-04-01 ENCOUNTER — Other Ambulatory Visit: Payer: Self-pay | Admitting: Family Medicine

## 2011-04-03 ENCOUNTER — Other Ambulatory Visit: Payer: Self-pay | Admitting: *Deleted

## 2011-04-03 MED ORDER — ATORVASTATIN CALCIUM 20 MG PO TABS: 20.0000 mg | ORAL_TABLET | Freq: Every day | ORAL | Status: DC

## 2011-04-04 ENCOUNTER — Other Ambulatory Visit: Payer: Self-pay | Admitting: Family Medicine

## 2011-04-19 ENCOUNTER — Other Ambulatory Visit: Payer: Self-pay | Admitting: Family Medicine

## 2011-04-22 ENCOUNTER — Other Ambulatory Visit: Payer: Self-pay | Admitting: *Deleted

## 2011-04-22 MED ORDER — GLIPIZIDE ER 5 MG PO TB24: 5.0000 mg | ORAL_TABLET | Freq: Every day | ORAL | Status: DC

## 2011-04-22 NOTE — Telephone Encounter (Signed)
Refilled med

## 2011-04-29 ENCOUNTER — Other Ambulatory Visit: Payer: Self-pay | Admitting: Family Medicine

## 2011-05-06 ENCOUNTER — Other Ambulatory Visit: Payer: Self-pay | Admitting: Family Medicine

## 2011-08-01 ENCOUNTER — Other Ambulatory Visit: Payer: Self-pay | Admitting: Family Medicine

## 2011-09-09 ENCOUNTER — Ambulatory Visit (INDEPENDENT_AMBULATORY_CARE_PROVIDER_SITE_OTHER): Payer: BC Managed Care – PPO | Admitting: Family Medicine

## 2011-09-09 ENCOUNTER — Encounter: Payer: Self-pay | Admitting: Family Medicine

## 2011-09-09 DIAGNOSIS — E119 Type 2 diabetes mellitus without complications: Secondary | ICD-10-CM

## 2011-09-09 DIAGNOSIS — I10 Essential (primary) hypertension: Secondary | ICD-10-CM

## 2011-09-09 DIAGNOSIS — E785 Hyperlipidemia, unspecified: Secondary | ICD-10-CM

## 2011-09-09 NOTE — Progress Notes (Signed)
  Subjective:    Patient ID: Richard Dalton, male    DOB: [birth year], 60 y.o.   MRN: [id number]  HPI  Chronic medical problems. History of type 2 diabetes, hyperlipidemia, hypertension and osteoarthritis. Medications reviewed. Blood sugars generally well controlled. Last A1c 6.6%. No symptoms of hyperglycemia. No hypoglycemia. Patient denies any recent chest pains or dyspnea. Occasional tingling sensation right upper extremity which seems to be positional and usually transient and improved with change of position. No weakness.  Hypertension treated with multiple medications. Blood pressure is well-controlled at home. No orthostasis. No headaches.  Past Medical History  Diagnosis Date  . Neoplasm of uncertain behavior of skin 02/07/2009  . DIABETES MELLITUS, TYPE II 08/07/2006  . HYPERLIPIDEMIA 08/07/2006  . ERECTILE DYSFUNCTION 11/12/2006  . HYPERTENSION 08/07/2006  . ALLERGIC RHINITIS 11/12/2006  . Extrinsic asthma, unspecified 05/11/2008  . ECZEMA 11/08/2008  . OSTEOARTHRITIS 08/07/2006  . PLANTAR FASCIITIS 11/12/2006  . GENERALIZED VACCINIA AS COMP MEDICAL CARE NEC 09/09/2010   Past Surgical History  Procedure Date  . Knee arthroscopy     right  . Tonsillectomy and adenoidectomy 1955  . Wisdom tooth extraction     reports that he quit smoking about 24 years ago. His smoking use included Cigarettes. He has a 40 pack-year smoking history. He does not have any smokeless tobacco history on file. His alcohol and drug histories not on file. family history includes Alzheimer's disease in his father and Diabetes in his brother, daughter, mother, and sister. Allergies  Allergen Reactions  . Tetracycline     REACTION: rash    Review of Systems  Constitutional: Negative for fatigue.  Eyes: Negative for visual disturbance.  Respiratory: Negative for cough, chest tightness and shortness of breath.   Cardiovascular: Negative for chest pain, palpitations and leg swelling.  Neurological: Negative for  dizziness, syncope, weakness, light-headedness and headaches.       Objective:   Physical Exam  Constitutional: He appears well-developed and well-nourished.  Neck: Neck supple. No thyromegaly present.  Cardiovascular: Normal rate, regular rhythm and normal heart sounds.   Pulmonary/Chest: Effort normal and breath sounds normal. No respiratory distress. He has no wheezes. He has no rales.  Musculoskeletal: He exhibits no edema.       Good distal pulses both upper extremities  Lymphadenopathy:    He has no cervical adenopathy.  Neurological:       Full-strength upper extremities. Normal sensory function          Assessment & Plan:  #1 type 2 diabetes. History of good control. Recheck A1c. Work on weight loss #2 hypertension slightly elevated today. Controlled by home readings. Continue close monitoring  #3 history of hyperlipidemia. Lipids from June adequate control. We'll plan repeat in 6 months.

## 2011-09-10 NOTE — Progress Notes (Signed)
Quick Note:  Pt informed on home VM ______ 

## 2012-01-01 ENCOUNTER — Other Ambulatory Visit: Payer: Self-pay | Admitting: Family Medicine

## 2012-01-29 ENCOUNTER — Other Ambulatory Visit: Payer: Self-pay | Admitting: Family Medicine

## 2012-03-08 LAB — HM DIABETES EYE EXAM

## 2012-03-09 ENCOUNTER — Encounter: Payer: Self-pay | Admitting: Family Medicine

## 2012-03-09 ENCOUNTER — Ambulatory Visit (INDEPENDENT_AMBULATORY_CARE_PROVIDER_SITE_OTHER): Payer: BC Managed Care – PPO | Admitting: Family Medicine

## 2012-03-09 VITALS — BP 140/60 | Temp 98.5°F | Wt 260.0 lb

## 2012-03-09 DIAGNOSIS — E785 Hyperlipidemia, unspecified: Secondary | ICD-10-CM

## 2012-03-09 DIAGNOSIS — I10 Essential (primary) hypertension: Secondary | ICD-10-CM

## 2012-03-09 DIAGNOSIS — E119 Type 2 diabetes mellitus without complications: Secondary | ICD-10-CM

## 2012-03-09 LAB — BASIC METABOLIC PANEL WITH GFR
BUN: 28 mg/dL — ABNORMAL HIGH (ref 6–23)
CO2: 25 meq/L (ref 19–32)
Calcium: 8.5 mg/dL (ref 8.4–10.5)
Chloride: 108 meq/L (ref 96–112)
Creatinine, Ser: 1.2 mg/dL (ref 0.4–1.5)
GFR: 67.06 mL/min
Glucose, Bld: 131 mg/dL — ABNORMAL HIGH (ref 70–99)
Potassium: 4.3 meq/L (ref 3.5–5.1)
Sodium: 142 meq/L (ref 135–145)

## 2012-03-09 LAB — LIPID PANEL
Cholesterol: 116 mg/dL (ref 0–200)
VLDL: 18.4 mg/dL (ref 0.0–40.0)

## 2012-03-09 LAB — HEPATIC FUNCTION PANEL
ALT: 25 U/L (ref 0–53)
AST: 25 U/L (ref 0–37)
Alkaline Phosphatase: 66 U/L (ref 39–117)
Bilirubin, Direct: 0 mg/dL (ref 0.0–0.3)
Total Bilirubin: 0.7 mg/dL (ref 0.3–1.2)
Total Protein: 6.7 g/dL (ref 6.0–8.3)

## 2012-03-09 NOTE — Progress Notes (Signed)
  Subjective:    Patient ID: Richard Dalton, male    DOB: August 16, 1951, 61 y.o.   MRN: [id number]  HPI  Medical followup. Type 2 diabetes, hypertension, and hyperlipidemia. Recent eye exam no retinopathy. Blood sugars have been checked rarely. No hypoglycemia. Last A1c 6.7%.  Patient compliant with all medications. No recent dizziness. No orthostasis. Denies any chest pains. He has history of seasonal allergies and asthma which have been well controlled. He continues to see allergist.  Patient not been monitoring blood pressures. No headaches.  Past Medical History  Diagnosis Date  . Neoplasm of uncertain behavior of skin 02/07/2009  . DIABETES MELLITUS, TYPE II 08/07/2006  . HYPERLIPIDEMIA 08/07/2006  . ERECTILE DYSFUNCTION 11/12/2006  . HYPERTENSION 08/07/2006  . ALLERGIC RHINITIS 11/12/2006  . Extrinsic asthma, unspecified 05/11/2008  . ECZEMA 11/08/2008  . OSTEOARTHRITIS 08/07/2006  . PLANTAR FASCIITIS 11/12/2006  . GENERALIZED VACCINIA AS COMP MEDICAL CARE NEC 09/09/2010   Past Surgical History  Procedure Date  . Knee arthroscopy     right  . Tonsillectomy and adenoidectomy 1955  . Wisdom tooth extraction     reports that he quit smoking about 25 years ago. His smoking use included Cigarettes. He has a 40 pack-year smoking history. He does not have any smokeless tobacco history on file. His alcohol and drug histories not on file. family history includes Alzheimer's disease in his father and Diabetes in his brother, daughter, mother, and sister. Allergies  Allergen Reactions  . Tetracycline     REACTION: rash      Review of Systems  Constitutional: Negative for fatigue.  Eyes: Negative for visual disturbance.  Respiratory: Negative for cough, chest tightness and shortness of breath.   Cardiovascular: Negative for chest pain, palpitations and leg swelling.  Neurological: Negative for dizziness, syncope, weakness, light-headedness and headaches.       Objective:   Physical Exam    Constitutional: He appears well-developed and well-nourished.  HENT:  Right Ear: External ear normal.  Left Ear: External ear normal.  Mouth/Throat: Oropharynx is clear and moist.  Neck: Neck supple.       No carotid bruits noted  Cardiovascular: Normal rate and regular rhythm.   Pulmonary/Chest: Effort normal and breath sounds normal. No respiratory distress. He has no wheezes. He has no rales.  Musculoskeletal: He exhibits no edema.       No foot lesions. Normal sensory function. Good distal foot pulses. No calluses.          Assessment & Plan:  #1 type 2 diabetes. Recheck A1c. Continue current medications. #2 hypertension. Slightly elevated today. Monitor closely at home and be in touch if consistently greater than 130/80. Check basic metabolic panel #3 dyslipidemia. Check lipid and hepatic panel.

## 2012-03-10 ENCOUNTER — Other Ambulatory Visit: Payer: Self-pay | Admitting: Family Medicine

## 2012-03-10 NOTE — Progress Notes (Signed)
Quick Note:  Pt informed ______ 

## 2012-04-19 ENCOUNTER — Other Ambulatory Visit: Payer: Self-pay | Admitting: Family Medicine

## 2012-07-28 ENCOUNTER — Other Ambulatory Visit: Payer: Self-pay | Admitting: Family Medicine

## 2012-08-18 ENCOUNTER — Other Ambulatory Visit: Payer: Self-pay | Admitting: *Deleted

## 2012-08-18 MED ORDER — PIOGLITAZONE HCL-METFORMIN ER 15-1000 MG PO TB24: 15.0000 mg | ORAL_TABLET | Freq: Every day | ORAL | Status: DC

## 2012-08-18 MED ORDER — DOXAZOSIN MESYLATE 4 MG PO TABS: 4.0000 mg | ORAL_TABLET | Freq: Every day | ORAL | Status: DC

## 2012-09-08 ENCOUNTER — Ambulatory Visit (INDEPENDENT_AMBULATORY_CARE_PROVIDER_SITE_OTHER): Payer: BC Managed Care – PPO | Admitting: Family Medicine

## 2012-09-08 ENCOUNTER — Encounter: Payer: Self-pay | Admitting: Family Medicine

## 2012-09-08 VITALS — BP 142/60 | Temp 98.0°F | Wt 258.0 lb

## 2012-09-08 DIAGNOSIS — I10 Essential (primary) hypertension: Secondary | ICD-10-CM

## 2012-09-08 DIAGNOSIS — E785 Hyperlipidemia, unspecified: Secondary | ICD-10-CM

## 2012-09-08 DIAGNOSIS — E119 Type 2 diabetes mellitus without complications: Secondary | ICD-10-CM

## 2012-09-08 NOTE — Progress Notes (Signed)
  Subjective:    Patient ID: Richard Dalton, male    DOB: [birth year], 61 y.o.   MRN: [id number]  HPI  Medical followup. Patient has history of hypertension, type 2 diabetes, hyperlipidemia. Medications reviewed. Compliant with all. Blood sugars stable. No hypoglycemia. No symptoms of hyperglycemia. He is frustrated with inability to lose weight. Not consistently exercising. He is down 2 pounds from last visit. A1c's have consistently been below 7. Has seen podiatrist regularly.  Denies dizziness. No chest pains. No peripheral edema issues. Recent construction of orthotics per podiatrist. Already had flu vaccine  Past Medical History  Diagnosis Date  . Neoplasm of uncertain behavior of skin 02/07/2009  . DIABETES MELLITUS, TYPE II 08/07/2006  . HYPERLIPIDEMIA 08/07/2006  . ERECTILE DYSFUNCTION 11/12/2006  . HYPERTENSION 08/07/2006  . ALLERGIC RHINITIS 11/12/2006  . Extrinsic asthma, unspecified 05/11/2008  . ECZEMA 11/08/2008  . OSTEOARTHRITIS 08/07/2006  . PLANTAR FASCIITIS 11/12/2006  . GENERALIZED VACCINIA AS COMP MEDICAL CARE NEC 09/09/2010   Past Surgical History  Procedure Date  . Knee arthroscopy     right  . Tonsillectomy and adenoidectomy 1955  . Wisdom tooth extraction     reports that he quit smoking about 25 years ago. His smoking use included Cigarettes. He has a 40 pack-year smoking history. He does not have any smokeless tobacco history on file. His alcohol and drug histories not on file. family history includes Alzheimer's disease in his father and Diabetes in his brother, daughter, mother, and sister. Allergies  Allergen Reactions  . Tetracycline     REACTION: rash      Review of Systems  Constitutional: Negative for fatigue.  Eyes: Negative for visual disturbance.  Respiratory: Negative for cough, chest tightness and shortness of breath.   Cardiovascular: Negative for chest pain, palpitations and leg swelling.  Neurological: Negative for dizziness, syncope, weakness,  light-headedness and headaches.       Objective:   Physical Exam  Constitutional: He appears well-developed and well-nourished.  Neck: Neck supple. No thyromegaly present.  Cardiovascular: Normal rate and regular rhythm.  Exam reveals no gallop.   Pulmonary/Chest: Effort normal and breath sounds normal. No respiratory distress. He has no wheezes. He has no rales.  Musculoskeletal: He exhibits no edema.          Assessment & Plan:  #1 type 2 diabetes. History of good control. Recheck A1c. Continue followup with podiatrist. Continue weight loss efforts. If A1c climbing above 7 consider other options such as Victoza #2 hypertension. Marginal control. Continue weight loss efforts and close monitoring. #3 hyperlipidemia. Lipids were checked last visit and at goal. Continue Lipitor.

## 2012-09-08 NOTE — Patient Instructions (Addendum)
Establish regular exercise and work on additional weight loss.

## 2012-09-22 LAB — HM DIABETES EYE EXAM

## 2012-11-26 ENCOUNTER — Encounter: Payer: Self-pay | Admitting: Family

## 2012-11-26 ENCOUNTER — Ambulatory Visit (INDEPENDENT_AMBULATORY_CARE_PROVIDER_SITE_OTHER): Payer: BC Managed Care – PPO | Admitting: Family

## 2012-11-26 VITALS — BP 160/80 | Temp 98.5°F | Wt 265.0 lb

## 2012-11-26 DIAGNOSIS — L738 Other specified follicular disorders: Secondary | ICD-10-CM

## 2012-11-26 DIAGNOSIS — L739 Follicular disorder, unspecified: Secondary | ICD-10-CM

## 2012-11-26 MED ORDER — CEPHALEXIN 500 MG PO TABS: 500.0000 mg | ORAL_TABLET | Freq: Four times a day (QID) | ORAL | Status: DC

## 2012-11-26 NOTE — Patient Instructions (Addendum)
Folliculitis   Folliculitis is redness, soreness, and swelling (inflammation) of the hair follicles. This condition can occur anywhere on the body. People with weakened immune systems, diabetes, or obesity have a greater risk of getting folliculitis.  CAUSES   Bacterial infection. This is the most common cause.   Fungal infection.   Viral infection.   Contact with certain chemicals, especially oils and tars.  Long-term folliculitis can result from bacteria that live in the nostrils. The bacteria may trigger multiple outbreaks of folliculitis over time.  SYMPTOMS  Folliculitis most commonly occurs on the scalp, thighs, legs, back, buttocks, and areas where hair is shaved frequently. An early sign of folliculitis is a small, white or yellow, pus-filled, itchy lesion (pustule). These lesions appear on a red, inflamed follicle. They are usually less than 0.2 inches (5 mm) wide. When there is an infection of the follicle that goes deeper, it becomes a boil or furuncle. A group of closely packed boils creates a larger lesion (carbuncle). Carbuncles tend to occur in hairy, sweaty areas of the body.  DIAGNOSIS   Your caregiver can usually tell what is wrong by doing a physical exam. A sample may be taken from one of the lesions and tested in a lab. This can help determine what is causing your folliculitis.  TREATMENT   Treatment may include:   Applying warm compresses to the affected areas.   Taking antibiotic medicines orally or applying them to the skin.   Draining the lesions if they contain a large amount of pus or fluid.   Laser hair removal for cases of long-lasting folliculitis. This helps to prevent regrowth of the hair.  HOME CARE INSTRUCTIONS   Apply warm compresses to the affected areas as directed by your caregiver.   If antibiotics are prescribed, take them as directed. Finish them even if you start to feel better.   You may take over-the-counter medicines to relieve itching.   Do not shave  irritated skin.   Follow up with your caregiver as directed.  SEEK IMMEDIATE MEDICAL CARE IF:    You have increasing redness, swelling, or pain in the affected area.   You have a fever.  MAKE SURE YOU:   Understand these instructions.   Will watch your condition.   Will get help right away if you are not doing well or get worse.  Document Released: 12/01/2001 Document Revised: 03/23/2012 Document Reviewed: 12/23/2011  [employer] Patient Information 2013 [employer]

## 2012-11-26 NOTE — Progress Notes (Signed)
Subjective:    Patient ID: Richard Dalton, male    DOB: 09-06-1951, 62 y.o.   MRN: [id number]  HPI 62 year old white male, nonsmoker, patient of Dr. Caryl Never is in today with complaints of a rash to the left back of his head has been present several months. Reports redness, tender to touch, clear to yellow discharge at times. He's been applying Neosporin and using head and shoulders shampoo with no relief. No one else in the home has this rash.   Review of Systems  Constitutional: Negative.   Respiratory: Negative.   Cardiovascular: Negative.   Gastrointestinal: Negative.   Genitourinary: Negative.   Musculoskeletal: Negative.   Skin: Positive for rash.       scalp  Neurological: Negative.   Hematological: Negative.    Past Medical History  Diagnosis Date  . Neoplasm of uncertain behavior of skin 02/07/2009  . DIABETES MELLITUS, TYPE II 08/07/2006  . HYPERLIPIDEMIA 08/07/2006  . ERECTILE DYSFUNCTION 11/12/2006  . HYPERTENSION 08/07/2006  . ALLERGIC RHINITIS 11/12/2006  . Extrinsic asthma, unspecified 05/11/2008  . ECZEMA 11/08/2008  . OSTEOARTHRITIS 08/07/2006  . PLANTAR FASCIITIS 11/12/2006  . GENERALIZED VACCINIA AS COMP MEDICAL CARE NEC 09/09/2010    History   Social History  . Marital Status: Married    Spouse Name: N/A    Number of Children: N/A  . Years of Education: N/A   Occupational History  . Not on file.   Social History Main Topics  . Smoking status: Former Smoker -- 2.00 packs/day for 20 years    Types: Cigarettes    Quit date: 03/10/1987  . Smokeless tobacco: Not on file  . Alcohol Use: Not on file  . Drug Use: Not on file  . Sexually Active: Not on file   Other Topics Concern  . Not on file   Social History Narrative  . No narrative on file    Past Surgical History  Procedure Laterality Date  . Knee arthroscopy      right  . Tonsillectomy and adenoidectomy  1955  . Wisdom tooth extraction      Family History  Problem Relation Age of Onset  .  Diabetes Mother   . Alzheimer's disease Father     died age 57  . Diabetes Sister     4 sisters with diabetes  . Diabetes Brother   . Diabetes Daughter     Allergies  Allergen Reactions  . Tetracycline     REACTION: rash    Current Outpatient Prescriptions on File Prior to Visit  Medication Sig Dispense Refill  . aspirin 81 MG tablet Take 81 mg by mouth daily.        Marland Kitchen atorvastatin (LIPITOR) 20 MG tablet Take 20 mg by mouth daily.        Marland Kitchen doxazosin (CARDURA) 4 MG tablet Take 1 tablet (4 mg total) by mouth at bedtime.  90 tablet  3  . DYMISTA 137-50 MCG/ACT SUSP One spray to each nostril twice daily      . EXFORGE 10-320 MG per tablet TAKE ONE TABLET DAILY  90 tablet  3  . Fluticasone-Salmeterol (ADVAIR) 250-50 MCG/DOSE AEPB Inhale 1 puff into the lungs every 12 (twelve) hours.        Marland Kitchen glipiZIDE (GLUCOTROL XL) 5 MG 24 hr tablet TAKE 1 TABLET EVERY DAY  90 tablet  3  . levocetirizine (XYZAL) 5 MG tablet Take 5 mg by mouth every evening.        Marland Kitchen LIPITOR 20 MG tablet  TAKE 1 TABLET BY MOUTH DAILY  90 tablet  3  . lisinopril-hydrochlorothiazide (PRINZIDE,ZESTORETIC) 20-25 MG per tablet Take 1 tablet by mouth daily.        Marland Kitchen lisinopril-hydrochlorothiazide (PRINZIDE,ZESTORETIC) 20-25 MG per tablet TAKE 1 TABLET BY MOUTH EVERY DAY  90 tablet  3  . montelukast (SINGULAIR) 10 MG tablet Take 10 mg by mouth at bedtime.        . Pioglitazone HCl-Metformin HCl (ACTOPLUS MET XR) 15-1000 MG TB24 Take 15 mg by mouth daily.  90 tablet  3   No current facility-administered medications on file prior to visit.    BP 160/80  Temp(Src) 98.5 F (36.9 C) (Oral)  Wt 265 lb (120.203 kg)  BMI 38.02 kg/m2chart    Objective:   Physical Exam  Constitutional: He is oriented to person, place, and time. He appears well-developed and well-nourished.  Neck: Normal range of motion. Neck supple. No thyromegaly present.  Cardiovascular: Normal rate, regular rhythm and normal heart sounds.   Pulmonary/Chest:  Breath sounds normal.  Abdominal: Soft. Bowel sounds are normal.  Musculoskeletal: Normal range of motion.  Neurological: He is alert and oriented to person, place, and time. He has normal reflexes.  Skin: Skin is warm and dry. Rash noted.  2-1/2 cm circular area to the left posterior scalp is red, minimally tender to touch, clear drainage, and inflamed.  Psychiatric: He has a normal mood and affect.          Assessment & Plan:  Assessment 1. Folliculitis  Plan: Cephalexin 500 mg 1 capsule by mouth 3 times a day x7 days. Hydrocortisone ointment to the affected area twice a day x3-4 days. Patient call the office if symptoms worsen or persist. Recheck a schedule, and as needed.

## 2012-11-28 ENCOUNTER — Encounter: Payer: Self-pay | Admitting: Family

## 2012-11-29 ENCOUNTER — Telehealth: Payer: Self-pay | Admitting: Family Medicine

## 2012-11-29 NOTE — Telephone Encounter (Signed)
Yes.  We had discussed this last office visit.  Making this change might assist with weight loss Stop Glipizide.  Start Victoza 0.6 mg injection once daily for one week then 1.2 mg once daily. Pt will probably need at least nurse visit to review how to give Victoza-maybe he could get rx first.

## 2012-11-29 NOTE — Telephone Encounter (Signed)
Pt had discussion w/ you about switiching to VICTOZA from glipiZIDE (GLUCOTROL XL) 5 MG 24 hr tablet Pt would like to move forward on this and try.  Pharm: [place], [place]

## 2012-11-30 NOTE — Telephone Encounter (Signed)
Pt informed and he will come for instruction and sample pick-up Fri afternoon

## 2012-11-30 NOTE — Addendum Note (Signed)
Addended by: Melchor Amour on: 11/30/2012 10:54 AM   Modules accepted: Orders

## 2012-12-15 ENCOUNTER — Encounter: Payer: Self-pay | Admitting: Family Medicine

## 2012-12-20 ENCOUNTER — Telehealth: Payer: Self-pay | Admitting: Family Medicine

## 2012-12-20 NOTE — Telephone Encounter (Signed)
I spoke with patient and his GI symptoms are already improving somewhat. He wishes to continue on the Victoza at this point.  He will let me know if symptoms don't continue to improve.

## 2012-12-27 ENCOUNTER — Telehealth: Payer: Self-pay | Admitting: Family Medicine

## 2012-12-27 ENCOUNTER — Ambulatory Visit: Payer: BC Managed Care – PPO | Admitting: Family Medicine

## 2012-12-27 NOTE — Telephone Encounter (Signed)
Patient Information:  Caller Name: Kyian  Phone: [phone]  Patient: Richard Dalton, Richard Dalton  Gender: Male  DOB: 1951-03-04  Age: 62 Years  PCP: Evelena Peat (Family Practice)  Office Follow Up:  Does the office need to follow up with this patient?: No  Instructions For The Office: N/A  RN Note:  Patient states he is out of town and unable to get to the office.  Advised pt to call his eye care provider to get recommendation.  Call back if he can schedule appt tomorrow.  Symptoms  Reason For Call & Symptoms: Pt states he burst a blood vessel in the left eye with mild discomfort.  Pt states he is out of town today.  Reviewed Health History In EMR: Yes  Reviewed Medications In EMR: Yes  Reviewed Allergies In EMR: Yes  Reviewed Surgeries / Procedures: Yes  Date of Onset of Symptoms: 12/26/2012  Guideline(s) Used:  Eye Injury  Eye Pain  Disposition Per Guideline:   See Today in Office  Reason For Disposition Reached:   Eye pain present > 24 hours  Advice Given:  N/A  Patient Will Follow Care Advice:  YES

## 2013-01-01 ENCOUNTER — Encounter: Payer: Self-pay | Admitting: Family Medicine

## 2013-01-04 MED ORDER — LIRAGLUTIDE 18 MG/3ML ~~LOC~~ SOLN: 1.2000 mg | Freq: Every day | SUBCUTANEOUS | Status: DC

## 2013-01-04 MED ORDER — INSULIN PEN NEEDLE 31G X 8 MM MISC: Status: DC

## 2013-01-07 ENCOUNTER — Other Ambulatory Visit: Payer: Self-pay | Admitting: *Deleted

## 2013-01-07 MED ORDER — AMLODIPINE BESYLATE-VALSARTAN 10-320 MG PO TABS: ORAL_TABLET | ORAL | Status: DC

## 2013-03-03 ENCOUNTER — Other Ambulatory Visit: Payer: Self-pay | Admitting: *Deleted

## 2013-03-03 MED ORDER — LISINOPRIL-HYDROCHLOROTHIAZIDE 20-25 MG PO TABS: ORAL_TABLET | ORAL | Status: DC

## 2013-03-09 ENCOUNTER — Ambulatory Visit (INDEPENDENT_AMBULATORY_CARE_PROVIDER_SITE_OTHER): Payer: BC Managed Care – PPO | Admitting: Family Medicine

## 2013-03-09 ENCOUNTER — Encounter: Payer: Self-pay | Admitting: Family Medicine

## 2013-03-09 VITALS — BP 150/70 | HR 80 | Temp 98.5°F | Resp 20 | Wt 251.0 lb

## 2013-03-09 DIAGNOSIS — I10 Essential (primary) hypertension: Secondary | ICD-10-CM

## 2013-03-09 DIAGNOSIS — R21 Rash and other nonspecific skin eruption: Secondary | ICD-10-CM

## 2013-03-09 DIAGNOSIS — E785 Hyperlipidemia, unspecified: Secondary | ICD-10-CM

## 2013-03-09 DIAGNOSIS — E119 Type 2 diabetes mellitus without complications: Secondary | ICD-10-CM

## 2013-03-09 LAB — HEPATIC FUNCTION PANEL
ALT: 23 U/L (ref 0–53)
AST: 20 U/L (ref 0–37)
Albumin: 3.3 g/dL — ABNORMAL LOW (ref 3.5–5.2)
Total Protein: 6.4 g/dL (ref 6.0–8.3)

## 2013-03-09 LAB — LIPID PANEL
Cholesterol: 117 mg/dL (ref 0–200)
LDL Cholesterol: 61 mg/dL (ref 0–99)
Triglycerides: 72 mg/dL (ref 0.0–149.0)

## 2013-03-09 LAB — BASIC METABOLIC PANEL
BUN: 21 mg/dL (ref 6–23)
CO2: 28 mEq/L (ref 19–32)
Calcium: 8.5 mg/dL (ref 8.4–10.5)
Creatinine, Ser: 1.1 mg/dL (ref 0.4–1.5)
Glucose, Bld: 256 mg/dL — ABNORMAL HIGH (ref 70–99)
Sodium: 139 mEq/L (ref 135–145)

## 2013-03-09 LAB — HM DIABETES FOOT EXAM: HM Diabetic Foot Exam: NORMAL

## 2013-03-09 LAB — HM DIABETES EYE EXAM

## 2013-03-09 MED ORDER — DESOXIMETASONE 0.25 % EX CREA: TOPICAL_CREAM | Freq: Two times a day (BID) | CUTANEOUS | Status: DC

## 2013-03-09 NOTE — Patient Instructions (Addendum)
Touch base in 2-3 weeks if scalp rash not improving Continue weight loss efforts

## 2013-03-09 NOTE — Progress Notes (Signed)
  Subjective:    Patient ID: Richard Dalton, male    DOB: [birth year], 62 y.o.   MRN: [id number]  HPI Patient seen for followup regarding type 2 diabetes. We added Victoza several months ago. He has had some significant weight loss since then. Overall feels well. Fasting blood sugar still very considerably. No symptoms of hypoglycemia He sees ophthalmologist every 6 months. No history of retinopathy. No neuropathy history Compliant with all medications. Blood pressures been well controlled generally, though slightly elevated today.  Hyperlipidemia treated with atorvastatin. No significant myalgias. Recurrent pruritic rash occipital scalp. Using hydrocortisone cream with minimal relief.  Past Medical History  Diagnosis Date  . Neoplasm of uncertain behavior of skin 02/07/2009  . DIABETES MELLITUS, TYPE II 08/07/2006  . HYPERLIPIDEMIA 08/07/2006  . ERECTILE DYSFUNCTION 11/12/2006  . HYPERTENSION 08/07/2006  . ALLERGIC RHINITIS 11/12/2006  . Extrinsic asthma, unspecified 05/11/2008  . ECZEMA 11/08/2008  . OSTEOARTHRITIS 08/07/2006  . PLANTAR FASCIITIS 11/12/2006  . GENERALIZED VACCINIA AS COMP MEDICAL CARE NEC 09/09/2010   Past Surgical History  Procedure Laterality Date  . Knee arthroscopy      right  . Tonsillectomy and adenoidectomy  1955  . Wisdom tooth extraction      reports that he quit smoking about 26 years ago. His smoking use included Cigarettes. He has a 40 pack-year smoking history. He does not have any smokeless tobacco history on file. His alcohol and drug histories are not on file. family history includes Alzheimer's disease in his father and Diabetes in his brother, daughter, mother, and sister. Allergies  Allergen Reactions  . Tetracycline     REACTION: rash       Review of Systems  Constitutional: Negative for fatigue.  Eyes: Negative for visual disturbance.  Respiratory: Negative for cough, chest tightness and shortness of breath.   Cardiovascular: Negative for chest  pain, palpitations and leg swelling.  Neurological: Negative for dizziness, syncope, weakness, light-headedness and headaches.       Objective:   Physical Exam  Constitutional: He appears well-developed and well-nourished.  Cardiovascular: Normal rate and regular rhythm.   Pulmonary/Chest: Effort normal and breath sounds normal. No respiratory distress. He has no wheezes. He has no rales.  Musculoskeletal: He exhibits no edema.  Skin:  Feet reveal no skin lesions. Good distal foot pulses. Good capillary refill. No calluses. Normal sensation with monofilament testing  Patient is nonspecific 1 cm area of erythema mid parieto-occipital scalp. No pustules. Nontender. No vesicles. No nodules          Assessment & Plan:  #1 type 2 diabetes. History of good control. Recheck A1c #2 hypertension. Slightly elevated today. Monitor at home. Continue weight loss efforts. Reassess 6 months. If still up them consider additional medication #3 nonspecific rash scalp. Topicort 0.25% cream and touch base if not resolving in 2 weeks

## 2013-05-03 ENCOUNTER — Other Ambulatory Visit: Payer: Self-pay | Admitting: Family Medicine

## 2013-07-21 ENCOUNTER — Other Ambulatory Visit: Payer: Self-pay | Admitting: Family Medicine

## 2013-08-31 ENCOUNTER — Other Ambulatory Visit: Payer: Self-pay | Admitting: Family Medicine

## 2013-09-07 ENCOUNTER — Ambulatory Visit: Payer: BC Managed Care – PPO | Admitting: Family Medicine

## 2013-09-07 ENCOUNTER — Other Ambulatory Visit: Payer: Self-pay | Admitting: Family Medicine

## 2013-09-14 ENCOUNTER — Encounter: Payer: Self-pay | Admitting: Family Medicine

## 2013-09-14 ENCOUNTER — Ambulatory Visit (INDEPENDENT_AMBULATORY_CARE_PROVIDER_SITE_OTHER): Payer: BC Managed Care – PPO | Admitting: Family Medicine

## 2013-09-14 VITALS — BP 118/60 | HR 69 | Temp 97.5°F | Wt 250.0 lb

## 2013-09-14 DIAGNOSIS — E669 Obesity, unspecified: Secondary | ICD-10-CM

## 2013-09-14 DIAGNOSIS — E119 Type 2 diabetes mellitus without complications: Secondary | ICD-10-CM

## 2013-09-14 DIAGNOSIS — I10 Essential (primary) hypertension: Secondary | ICD-10-CM

## 2013-09-14 LAB — HEMOGLOBIN A1C: Hgb A1c MFr Bld: 7.4 % — ABNORMAL HIGH (ref 4.6–6.5)

## 2013-09-14 MED ORDER — LIRAGLUTIDE 18 MG/3ML ~~LOC~~ SOPN: 1.2000 mg | PEN_INJECTOR | Freq: Every day | SUBCUTANEOUS | Status: DC

## 2013-09-14 NOTE — Progress Notes (Signed)
Pre visit review using our clinic review tool, if applicable. No additional management support is needed unless otherwise documented below in the visit note. 

## 2013-09-14 NOTE — Patient Instructions (Signed)
Reduce Cardura to one half tablet daily for one week and then discontinue if BP < 130/85

## 2013-09-14 NOTE — Progress Notes (Signed)
   Subjective:    Patient ID: Richard Dalton, male    DOB: 07/16/1951, 62 y.o.   MRN: [id number]  HPI Patient is seen for follow up His history of obesity, osteoarthritis, hypertension, hyperlipidemia, asthma, and type 2 diabetes Last A1c 7.3%. He has lost some weight with Victoza but has plateaued with weight loss efforts. No consistent exercise. Recent eye exam unremarkable. No symptoms of hyperglycemia. Denies any hypoglycemic symptoms.  Hypertension which is been well controlled on multidrug regimen. He has recently had some orthostatic type symptoms occasionally. He does take Cardura and has not taken this for BPH symptoms.  Past Medical History  Diagnosis Date  . Neoplasm of uncertain behavior of skin 02/07/2009  . DIABETES MELLITUS, TYPE II 08/07/2006  . HYPERLIPIDEMIA 08/07/2006  . ERECTILE DYSFUNCTION 11/12/2006  . HYPERTENSION 08/07/2006  . ALLERGIC RHINITIS 11/12/2006  . Extrinsic asthma, unspecified 05/11/2008  . ECZEMA 11/08/2008  . OSTEOARTHRITIS 08/07/2006  . PLANTAR FASCIITIS 11/12/2006  . GENERALIZED VACCINIA AS COMP MEDICAL CARE NEC 09/09/2010   Past Surgical History  Procedure Laterality Date  . Knee arthroscopy      right  . Tonsillectomy and adenoidectomy  1955  . Wisdom tooth extraction      reports that he quit smoking about 26 years ago. His smoking use included Cigarettes. He has a 40 pack-year smoking history. He does not have any smokeless tobacco history on file. His alcohol and drug histories are not on file. family history includes Alzheimer's disease in his father; Diabetes in his brother, daughter, mother, and sister. Allergies  Allergen Reactions  . Tetracycline     REACTION: rash      Review of Systems  Constitutional: Negative for fatigue and unexpected weight change.  Eyes: Negative for visual disturbance.  Respiratory: Negative for cough, chest tightness and shortness of breath.   Cardiovascular: Negative for chest pain, palpitations and leg swelling.    Neurological: Negative for dizziness, syncope, weakness, light-headedness and headaches.       Objective:   Physical Exam  Constitutional: He appears well-developed and well-nourished.  Cardiovascular: Normal rate and regular rhythm.   Pulmonary/Chest: Effort normal and breath sounds normal. No respiratory distress. He has no wheezes. He has no rales.  Musculoskeletal: He exhibits no edema.  Skin:  Feet reveal no skin lesions. Good distal foot pulses. Good capillary refill. No calluses. Normal sensation with monofilament testing           Assessment & Plan:  #1 type 2 diabetes. History of marginal control. Recheck A1c. Needs to lose some weight we had a long discussion regarding this. He prefers lifestyle management is close to goal versus additional medication #2 hypertension which has been well controlled by home readings. He is describing occasional orthostatic type symptoms. Try reducing Cardura to 2 mg at night and then discontinue after one week if blood pressures remain consistently below 130/85.

## 2013-09-16 ENCOUNTER — Other Ambulatory Visit: Payer: Self-pay | Admitting: Family Medicine

## 2013-09-16 DIAGNOSIS — E119 Type 2 diabetes mellitus without complications: Secondary | ICD-10-CM

## 2013-11-07 ENCOUNTER — Encounter: Payer: Self-pay | Admitting: Family Medicine

## 2013-12-07 ENCOUNTER — Encounter: Payer: Self-pay | Admitting: Family Medicine

## 2013-12-07 ENCOUNTER — Ambulatory Visit (INDEPENDENT_AMBULATORY_CARE_PROVIDER_SITE_OTHER): Payer: BC Managed Care – PPO | Admitting: Family Medicine

## 2013-12-07 ENCOUNTER — Telehealth: Payer: Self-pay | Admitting: Family Medicine

## 2013-12-07 VITALS — BP 140/70 | HR 93 | Temp 98.7°F | Wt 250.0 lb

## 2013-12-07 DIAGNOSIS — I10 Essential (primary) hypertension: Secondary | ICD-10-CM

## 2013-12-07 DIAGNOSIS — E119 Type 2 diabetes mellitus without complications: Secondary | ICD-10-CM

## 2013-12-07 LAB — HEMOGLOBIN A1C: Hgb A1c MFr Bld: 8.2 % — ABNORMAL HIGH (ref 4.6–6.5)

## 2013-12-07 NOTE — Progress Notes (Signed)
Pre visit review using our clinic review tool, if applicable. No additional management support is needed unless otherwise documented below in the visit note. 

## 2013-12-07 NOTE — Progress Notes (Signed)
   Subjective:    Patient ID: Richard Dalton, male    DOB: [birth year], 63 y.o.   MRN: [id number]  HPI Patient here for followup regarding chronic medical problems. He has type 2 diabetes, hypertension, obesity, hyperlipidemia. Type 2 diabetes somewhat poorly controlled. Recent A1c 7.4%. He has unfortunately not lost any weight. He has recently changed job and anticipates more activity with his current job. Medications are reviewed. Compliant with all. No symptoms of polydipsia or polyuria.  Hypertension treated with medications including lisinopril, amlodipine, HCTZ. He recently discontinued Cardura. By his home readings his blood pressures been stable and consistently less than 140/90. No headaches. No dizziness. No chest pains. Hyperlipidemia treated with Lipitor. Lipids were at goal of summer. No history of CAD.  Past Medical History  Diagnosis Date  . Neoplasm of uncertain behavior of skin 02/07/2009  . DIABETES MELLITUS, TYPE II 08/07/2006  . HYPERLIPIDEMIA 08/07/2006  . ERECTILE DYSFUNCTION 11/12/2006  . HYPERTENSION 08/07/2006  . ALLERGIC RHINITIS 11/12/2006  . Extrinsic asthma, unspecified 05/11/2008  . ECZEMA 11/08/2008  . OSTEOARTHRITIS 08/07/2006  . PLANTAR FASCIITIS 11/12/2006  . GENERALIZED VACCINIA AS COMP MEDICAL CARE NEC 09/09/2010   Past Surgical History  Procedure Laterality Date  . Knee arthroscopy      right  . Tonsillectomy and adenoidectomy  1955  . Wisdom tooth extraction      reports that he quit smoking about 26 years ago. His smoking use included Cigarettes. He has a 40 pack-year smoking history. He does not have any smokeless tobacco history on file. His alcohol and drug histories are not on file. family history includes Alzheimer's disease in his father; Diabetes in his brother, daughter, mother, and sister. Allergies  Allergen Reactions  . Tetracycline     REACTION: rash      Review of Systems  Constitutional: Negative for fatigue.  Eyes: Negative for visual  disturbance.  Respiratory: Negative for cough, chest tightness and shortness of breath.   Cardiovascular: Negative for chest pain, palpitations and leg swelling.  Endocrine: Negative for polydipsia and polyuria.  Neurological: Negative for dizziness, syncope, weakness, light-headedness and headaches.       Objective:   Physical Exam  Constitutional: He is oriented to person, place, and time. He appears well-developed and well-nourished.  HENT:  Right Ear: External ear normal.  Left Ear: External ear normal.  Mouth/Throat: Oropharynx is clear and moist.  Eyes: Pupils are equal, round, and reactive to light.  Neck: Neck supple. No thyromegaly present.  Cardiovascular: Normal rate and regular rhythm.   Pulmonary/Chest: Effort normal and breath sounds normal. No respiratory distress. He has no wheezes. He has no rales.  Musculoskeletal: He exhibits no edema.  Neurological: He is alert and oriented to person, place, and time.          Assessment & Plan:  #1 hypertension. Marginal control by our readings but fairly well controlled by home readings. Needs to lose some weight. Continue current medications. #2 type 2 diabetes. History of slightly suboptimal control. Repeat A1c today. Needs to lose weight. #3 hyperlipidemia. Stable by recent labs. Continue atorvastatin

## 2013-12-07 NOTE — Telephone Encounter (Signed)
Relevant patient education assigned to patient using Emmi. ° °

## 2013-12-08 ENCOUNTER — Other Ambulatory Visit: Payer: Self-pay

## 2013-12-08 MED ORDER — CANAGLIFLOZIN 100 MG PO TABS: 100.0000 mg | ORAL_TABLET | Freq: Every day | ORAL | Status: DC

## 2013-12-09 ENCOUNTER — Telehealth: Payer: Self-pay

## 2013-12-09 NOTE — Telephone Encounter (Signed)
Relevant patient education assigned to patient using Emmi. ° °

## 2014-01-03 ENCOUNTER — Ambulatory Visit (INDEPENDENT_AMBULATORY_CARE_PROVIDER_SITE_OTHER): Payer: BC Managed Care – PPO | Admitting: Family Medicine

## 2014-01-03 ENCOUNTER — Encounter: Payer: Self-pay | Admitting: Family Medicine

## 2014-01-03 VITALS — BP 132/66 | HR 91 | Temp 98.4°F | Wt 243.0 lb

## 2014-01-03 DIAGNOSIS — R05 Cough: Secondary | ICD-10-CM

## 2014-01-03 DIAGNOSIS — R059 Cough, unspecified: Secondary | ICD-10-CM

## 2014-01-03 MED ORDER — HYDROCODONE-HOMATROPINE 5-1.5 MG/5ML PO SYRP: 5.0000 mL | ORAL_SOLUTION | Freq: Four times a day (QID) | ORAL | Status: AC | PRN

## 2014-01-03 MED ORDER — AZITHROMYCIN 250 MG PO TABS: ORAL_TABLET | ORAL | Status: AC

## 2014-01-03 NOTE — Progress Notes (Signed)
Pre visit review using our clinic review tool, if applicable. No additional management support is needed unless otherwise documented below in the visit note. 

## 2014-01-03 NOTE — Patient Instructions (Addendum)
Follow up promptly for any fever or increased shortness of breath. 

## 2014-01-03 NOTE — Progress Notes (Signed)
   Subjective:    Patient ID: Richard Dalton, male    DOB: 1951/02/20, 63 y.o.   MRN: [id number]  Cough Pertinent negatives include no chest pain, chills, fever, shortness of breath or wheezing.   Acute visit for cough and chest congestion. Onset 6 days ago. Patient nonsmoker. He's had no fever. No wheezing. No dyspnea. No body aches. Cough has been severe at night. He does have history of asthma and takes Advair regularly. His rescue inhaler which he uses occasionally. No peripheral edema issues.  He has lost about 7 pounds due to his efforts since last visit and overall feels good  Past Medical History  Diagnosis Date  . Neoplasm of uncertain behavior of skin 02/07/2009  . DIABETES MELLITUS, TYPE II 08/07/2006  . HYPERLIPIDEMIA 08/07/2006  . ERECTILE DYSFUNCTION 11/12/2006  . HYPERTENSION 08/07/2006  . ALLERGIC RHINITIS 11/12/2006  . Extrinsic asthma, unspecified 05/11/2008  . ECZEMA 11/08/2008  . OSTEOARTHRITIS 08/07/2006  . PLANTAR FASCIITIS 11/12/2006  . GENERALIZED VACCINIA AS COMP MEDICAL CARE NEC 09/09/2010   Past Surgical History  Procedure Laterality Date  . Knee arthroscopy      right  . Tonsillectomy and adenoidectomy  1955  . Wisdom tooth extraction      reports that he quit smoking about 26 years ago. His smoking use included Cigarettes. He has a 40 pack-year smoking history. He does not have any smokeless tobacco history on file. His alcohol and drug histories are not on file. family history includes Alzheimer's disease in his father; Diabetes in his brother, daughter, mother, and sister. Allergies  Allergen Reactions  . Tetracycline     REACTION: rash      Review of Systems  Constitutional: Negative for fever and chills.  HENT: Positive for congestion.   Respiratory: Positive for cough. Negative for shortness of breath and wheezing.   Cardiovascular: Negative for chest pain.       Objective:   Physical Exam  Constitutional: He appears well-developed and  well-nourished.  HENT:  Right Ear: External ear normal.  Left Ear: External ear normal.  Mouth/Throat: Oropharynx is clear and moist.  Neck: Neck supple. No thyromegaly present.  Cardiovascular: Normal rate.   Pulmonary/Chest: Effort normal and breath sounds normal. No respiratory distress. He has no wheezes.  Patient does have some faint crackles left base          Assessment & Plan:   cough. Patient does have finding of some rales in his left base with no history of known chronic rales. Does not have any fever or other suggestion of pneumonia and this may represent some atelectasis. Given his comorbidities, start Zithromax and Hycodan cough syrup for nighttime suppression as needed. Followup promptly for fever or shortness of breath

## 2014-01-04 ENCOUNTER — Encounter: Payer: Self-pay | Admitting: Family Medicine

## 2014-01-14 ENCOUNTER — Other Ambulatory Visit: Payer: Self-pay | Admitting: Family Medicine

## 2014-01-28 ENCOUNTER — Other Ambulatory Visit: Payer: Self-pay | Admitting: Family Medicine

## 2014-02-04 ENCOUNTER — Emergency Department (HOSPITAL_COMMUNITY)
Admission: EM | Admit: 2014-02-04 | Discharge: 2014-02-04 | Disposition: A | Discharge status: Home / Self Care | Payer: BC Managed Care – PPO | Source: Home / Self Care | Attending: Family Medicine | Admitting: Family Medicine

## 2014-02-04 ENCOUNTER — Encounter (HOSPITAL_COMMUNITY): Payer: Self-pay | Admitting: Emergency Medicine

## 2014-02-04 ENCOUNTER — Emergency Department (INDEPENDENT_AMBULATORY_CARE_PROVIDER_SITE_OTHER): Payer: BC Managed Care – PPO

## 2014-02-04 DIAGNOSIS — J069 Acute upper respiratory infection, unspecified: Secondary | ICD-10-CM

## 2014-02-04 MED ORDER — BENZONATATE 100 MG PO CAPS: 100.0000 mg | ORAL_CAPSULE | Freq: Three times a day (TID) | ORAL | Status: DC | PRN

## 2014-02-04 MED ORDER — IPRATROPIUM BROMIDE 0.06 % NA SOLN: 2.0000 | Freq: Four times a day (QID) | NASAL | Status: DC

## 2014-02-04 NOTE — ED Notes (Signed)
Sinus congestion, cough, green phlegm, onset 10 days ago

## 2014-02-04 NOTE — Discharge Instructions (Signed)
Your chest xray was without indication of pneumonia or severe bronchitis. Most coughs associated with a viral upper respiratory infections last about 2 weeks. I would like you to use medications for nasal drainage and cough as prescribed and follow up with your doctor if symptoms persist or you develop fever. Your evaluations does not indicate the need to take antibiotics.  Upper Respiratory Infection, Adult An upper respiratory infection (URI) is also sometimes known as the common cold. The upper respiratory tract includes the nose, sinuses, throat, trachea, and bronchi. Bronchi are the airways leading to the lungs. Most people improve within 1 week, but symptoms can last up to 2 weeks. A residual cough may last even longer.  CAUSES Many different viruses can infect the tissues lining the upper respiratory tract. The tissues become irritated and inflamed and often become very moist. Mucus production is also common. A cold is contagious. You can easily spread the virus to others by oral contact. This includes kissing, sharing a glass, coughing, or sneezing. Touching your mouth or nose and then touching a surface, which is then touched by another person, can also spread the virus. SYMPTOMS  Symptoms typically develop 1 to 3 days after you come in contact with a cold virus. Symptoms vary from person to person. They may include:  Runny nose.  Sneezing.  Nasal congestion.  Sinus irritation.  Sore throat.  Loss of voice (laryngitis).  Cough.  Fatigue.  Muscle aches.  Loss of appetite.  Headache.  Low-grade fever. DIAGNOSIS  You might diagnose your own cold based on familiar symptoms, since most people get a cold 2 to 3 times a year. Your caregiver can confirm this based on your exam. Most importantly, your caregiver can check that your symptoms are not due to another disease such as strep throat, sinusitis, pneumonia, asthma, or epiglottitis. Blood tests, throat tests, and X-rays are not  necessary to diagnose a common cold, but they may sometimes be helpful in excluding other more serious diseases. Your caregiver will decide if any further tests are required. RISKS AND COMPLICATIONS  You may be at risk for a more severe case of the common cold if you smoke cigarettes, have chronic heart disease (such as heart failure) or lung disease (such as asthma), or if you have a weakened immune system. The very young and very old are also at risk for more serious infections. Bacterial sinusitis, middle ear infections, and bacterial pneumonia can complicate the common cold. The common cold can worsen asthma and chronic obstructive pulmonary disease (COPD). Sometimes, these complications can require emergency medical care and may be life-threatening. PREVENTION  The best way to protect against getting a cold is to practice good hygiene. Avoid oral or hand contact with people with cold symptoms. Wash your hands often if contact occurs. There is no clear evidence that vitamin C, vitamin E, echinacea, or exercise reduces the chance of developing a cold. However, it is always recommended to get plenty of rest and practice good nutrition. TREATMENT  Treatment is directed at relieving symptoms. There is no cure. Antibiotics are not effective, because the infection is caused by a virus, not by bacteria. Treatment may include:  Increased fluid intake. Sports drinks offer valuable electrolytes, sugars, and fluids.  Breathing heated mist or steam (vaporizer or shower).  Eating chicken soup or other clear broths, and maintaining good nutrition.  Getting plenty of rest.  Using gargles or lozenges for comfort.  Controlling fevers with ibuprofen or acetaminophen as directed by your  caregiver.  Increasing usage of your inhaler if you have asthma. Zinc gel and zinc lozenges, taken in the first 24 hours of the common cold, can shorten the duration and lessen the severity of symptoms. Pain medicines may help  with fever, muscle aches, and throat pain. A variety of non-prescription medicines are available to treat congestion and runny nose. Your caregiver can make recommendations and may suggest nasal or lung inhalers for other symptoms.  HOME CARE INSTRUCTIONS   Only take over-the-counter or prescription medicines for pain, discomfort, or fever as directed by your caregiver.  Use a warm mist humidifier or inhale steam from a shower to increase air moisture. This may keep secretions moist and make it easier to breathe.  Drink enough water and fluids to keep your urine clear or pale yellow.  Rest as needed.  Return to work when your temperature has returned to normal or as your caregiver advises. You may need to stay home longer to avoid infecting others. You can also use a face mask and careful hand washing to prevent spread of the virus. SEEK MEDICAL CARE IF:   After the first few days, you feel you are getting worse rather than better.  You need your caregiver's advice about medicines to control symptoms.  You develop chills, worsening shortness of breath, or brown or red sputum. These may be signs of pneumonia.  You develop yellow or brown nasal discharge or pain in the face, especially when you bend forward. These may be signs of sinusitis.  You develop a fever, swollen neck glands, pain with swallowing, or white areas in the back of your throat. These may be signs of strep throat. SEEK IMMEDIATE MEDICAL CARE IF:   You have a fever.  You develop severe or persistent headache, ear pain, sinus pain, or chest pain.  You develop wheezing, a prolonged cough, cough up blood, or have a change in your usual mucus (if you have chronic lung disease).  You develop sore muscles or a stiff neck. Document Released: 03/18/2001 Document Revised: 12/15/2011 Document Reviewed: 01/24/2011 [facility] Patient Information 2014 [place], [place].

## 2014-02-04 NOTE — ED Provider Notes (Signed)
CSN: [id number]     Arrival date & time 02/04/14  1335 History   First MD Initiated Contact with Patient 02/04/14 1508     Chief Complaint  Patient presents with  . URI   (Consider location/radiation/quality/duration/timing/severity/associated sxs/prior Treatment) HPI Comments: Patient is a non-smoker.  Patient is a 63 y.o. male presenting with URI. The history is provided by the patient.  URI Presenting symptoms: congestion, cough and rhinorrhea   Presenting symptoms: no ear pain, no facial pain, no fatigue and no fever   Presenting symptoms comment:  +post nasal drainage ans copious nasal drainage States he had a sore throat on initial day of symptoms but this symptom has resolved. Severity:  Moderate Onset quality:  Gradual Duration:  10 days Progression:  Unchanged Chronicity:  New Relieved by: +reports some relief with OTC Alka Seltzer cough and cold products. Associated symptoms: no arthralgias, no headaches, no myalgias, no neck pain, no sinus pain, no sneezing, no swollen glands and no wheezing   Risk factors: no chronic respiratory disease, no immunosuppression, no recent illness, no recent travel and no sick contacts     Past Medical History  Diagnosis Date  . Neoplasm of uncertain behavior of skin 02/07/2009  . DIABETES MELLITUS, TYPE II 08/07/2006  . HYPERLIPIDEMIA 08/07/2006  . ERECTILE DYSFUNCTION 11/12/2006  . HYPERTENSION 08/07/2006  . ALLERGIC RHINITIS 11/12/2006  . Extrinsic asthma, unspecified 05/11/2008  . ECZEMA 11/08/2008  . OSTEOARTHRITIS 08/07/2006  . PLANTAR FASCIITIS 11/12/2006  . GENERALIZED VACCINIA AS COMP MEDICAL CARE NEC 09/09/2010   Past Surgical History  Procedure Laterality Date  . Knee arthroscopy      right  . Tonsillectomy and adenoidectomy  1955  . Wisdom tooth extraction     Family History  Problem Relation Age of Onset  . Diabetes Mother   . Alzheimer's disease Father     died age 52  . Diabetes Sister     4 sisters with diabetes  . Diabetes  Brother   . Diabetes Daughter    History  Substance Use Topics  . Smoking status: Former Smoker -- 2.00 packs/day for 20 years    Types: Cigarettes    Quit date: 03/10/1987  . Smokeless tobacco: Not on file  . Alcohol Use: Yes    Review of Systems  Constitutional: Negative for fever and fatigue.  HENT: Positive for congestion and rhinorrhea. Negative for ear pain and sneezing.   Eyes: Negative.   Respiratory: Positive for cough. Negative for chest tightness, shortness of breath and wheezing.   Cardiovascular: Negative.   Gastrointestinal: Negative.   Endocrine: Negative for polydipsia, polyphagia and polyuria.  Genitourinary: Negative.   Musculoskeletal: Negative for arthralgias, myalgias and neck pain.  Skin: Negative.   Neurological: Negative for headaches.    Allergies  Tetracycline  Home Medications   Prior to Admission medications   Medication Sig Start Date End Date Taking? Authorizing Provider  ACTOPLUS MET XR 15-1000 MG TB24 TAKE 1 TABLET BY MOUTH EVERY DAY 09/07/13   [name], MD  amLODipine-valsartan (EXFORGE) 10-320 MG per tablet TAKE 1 TABLET BY MOUTH EVERY DAY 01/14/14   [name], MD  aspirin 81 MG tablet Take 81 mg by mouth daily.      Historical Provider, MD  atorvastatin (LIPITOR) 20 MG tablet Take 20 mg by mouth daily. Take brand only    Historical Provider, MD  Canagliflozin (INVOKANA) 100 MG TABS Take 1 tablet (100 mg total) by mouth daily. 12/08/13   Alinda Sierras  Burchette, MD  chlorpheniramine (CHLOR-TRIMETON) 4 MG tablet Take 4 mg by mouth daily.    Historical Provider, MD  DYMISTA 137-50 MCG/ACT SUSP One spray to each nostril twice daily 02/03/12   Historical Provider, MD  Fluticasone-Salmeterol (ADVAIR) 250-50 MCG/DOSE AEPB Inhale 1 puff into the lungs every 12 (twelve) hours.      Historical Provider, MD  Insulin Pen Needle 31G X 8 MM MISC Use pen needles with Victoza daily as instructed 01/04/13   [name], MD  levocetirizine (XYZAL)  5 MG tablet Take 5 mg by mouth every evening.      Historical Provider, MD  Liraglutide (VICTOZA) 18 MG/3ML SOPN Inject 1.2 mg into the skin daily. 09/14/13   [name], MD  lisinopril-hydrochlorothiazide (PRINZIDE,ZESTORETIC) 20-25 MG per tablet TAKE 1 TABLET BY MOUTH EVERY DAY 03/03/13   [name], MD  montelukast (SINGULAIR) 10 MG tablet Take 10 mg by mouth at bedtime.      Historical Provider, MD  NOVOFINE 32G X 6 MM MISC USE ONCE DAILY AS DIRECTED    [name], MD   BP 148/52  Pulse 98  Temp(Src) 98.2 F (36.8 C) (Oral)  Resp 16  SpO2 100% Physical Exam  Nursing note and vitals reviewed. Constitutional: He is oriented to person, place, and time. He appears well-developed and well-nourished. No distress.  HENT:  Head: Normocephalic and atraumatic.  Right Ear: Hearing, tympanic membrane, external ear and ear canal normal.  Left Ear: Hearing, tympanic membrane, external ear and ear canal normal.  Nose: Rhinorrhea present.  Mouth/Throat: Uvula is midline, oropharynx is clear and moist and mucous membranes are normal. No oral lesions. No trismus in the jaw. No uvula swelling.  Eyes: Conjunctivae are normal. Right eye exhibits no discharge. Left eye exhibits no discharge. No scleral icterus.  Neck: Normal range of motion. Neck supple.  Cardiovascular: Normal rate, regular rhythm and normal heart sounds.   Pulmonary/Chest: Effort normal and breath sounds normal.  Musculoskeletal: Normal range of motion.  Lymphadenopathy:    He has no cervical adenopathy.  Neurological: He is alert and oriented to person, place, and time.  Skin: Skin is warm and dry. No rash noted.  Psychiatric: He has a normal mood and affect. His behavior is normal.    ED Course  Procedures (including critical care time) Labs Review Labs Reviewed - No data to display  Imaging Review Dg Chest 2 View  02/04/2014   CLINICAL DATA:  Productive cough for the past 10 days.  EXAM: CHEST  2 VIEW   COMPARISON:  None.  FINDINGS: Borderline enlarged cardiac silhouette. Mild linear density at the left lung base compatible with focal atelectasis or scarring. Otherwise clear lungs. Minimal diffuse peribronchial thickening. Thoracic spine degenerative changes.  IMPRESSION: Minimal bronchitic changes.   Electronically Signed   By: Enrique Sack M.D.   On: 02/04/2014 15:26     MDM   1. URI (upper respiratory infection)    CXR: unremarkable. No CAP. Likely viral URI.  Will discharge home with Atrovent nasal spray and cough suppressant and advise follow up with PCP.   [place], [place] 02/04/14 1540

## 2014-02-07 NOTE — ED Provider Notes (Signed)
Medical screening examination/treatment/procedure(s) were performed by resident physician or non-physician practitioner and as supervising physician I was immediately available for consultation/collaboration.   Pauline Good MD.   Billy Fischer, MD 02/07/14 [phone]

## 2014-03-10 ENCOUNTER — Encounter: Payer: Self-pay | Admitting: Family Medicine

## 2014-03-10 ENCOUNTER — Ambulatory Visit (INDEPENDENT_AMBULATORY_CARE_PROVIDER_SITE_OTHER): Payer: BC Managed Care – PPO | Admitting: Family Medicine

## 2014-03-10 ENCOUNTER — Other Ambulatory Visit: Payer: Self-pay | Admitting: Family Medicine

## 2014-03-10 VITALS — BP 134/68 | HR 88 | Temp 98.0°F | Wt 242.0 lb

## 2014-03-10 DIAGNOSIS — E119 Type 2 diabetes mellitus without complications: Secondary | ICD-10-CM

## 2014-03-10 DIAGNOSIS — I10 Essential (primary) hypertension: Secondary | ICD-10-CM

## 2014-03-10 LAB — HEMOGLOBIN A1C: Hgb A1c MFr Bld: 7.5 % — ABNORMAL HIGH (ref 4.6–6.5)

## 2014-03-10 NOTE — Progress Notes (Signed)
   Subjective:    Patient ID: Richard Dalton, male    DOB: [birth year], 63 y.o.   MRN: [id number]  HPI Followup type 2 diabetes. Last A1c 8.2 percent. We started Invokana.  He has tolerated with no side effects. His new job involves more walking and has lost about 8 pounds. He has also made some dietary restrictions. No hyperglycemic symptoms. No symptoms of polyuria or polydipsia. Overall feels well.  Hypertension which is currently treated with lisinopril HCTZ and amlodipine valsartan combination. No dizziness. No chest pains.  Past Medical History  Diagnosis Date  . Neoplasm of uncertain behavior of skin 02/07/2009  . DIABETES MELLITUS, TYPE II 08/07/2006  . HYPERLIPIDEMIA 08/07/2006  . ERECTILE DYSFUNCTION 11/12/2006  . HYPERTENSION 08/07/2006  . ALLERGIC RHINITIS 11/12/2006  . Extrinsic asthma, unspecified 05/11/2008  . ECZEMA 11/08/2008  . OSTEOARTHRITIS 08/07/2006  . PLANTAR FASCIITIS 11/12/2006  . GENERALIZED VACCINIA AS COMP MEDICAL CARE NEC 09/09/2010   Past Surgical History  Procedure Laterality Date  . Knee arthroscopy      right  . Tonsillectomy and adenoidectomy  1955  . Wisdom tooth extraction      reports that he quit smoking about 27 years ago. His smoking use included Cigarettes. He has a 40 pack-year smoking history. He does not have any smokeless tobacco history on file. He reports that he drinks alcohol. He reports that he does not use illicit drugs. family history includes Alzheimer's disease in his father; Diabetes in his brother, daughter, mother, and sister. Allergies  Allergen Reactions  . Tetracycline     REACTION: rash      Review of Systems  Constitutional: Negative for fatigue.  Eyes: Negative for visual disturbance.  Respiratory: Negative for cough, chest tightness and shortness of breath.   Cardiovascular: Negative for chest pain, palpitations and leg swelling.  Endocrine: Negative for polydipsia and polyuria.  Neurological: Negative for dizziness, syncope,  weakness, light-headedness and headaches.       Objective:   Physical Exam  Constitutional: He appears well-developed and well-nourished.  Neck: Neck supple. No thyromegaly present.  Cardiovascular: Normal rate and regular rhythm.  Exam reveals no gallop.   Pulmonary/Chest: Effort normal and breath sounds normal. No respiratory distress. He has no wheezes. He has no rales.  Musculoskeletal: He exhibits no edema.  Psychiatric: He has a normal mood and affect. His behavior is normal.          Assessment & Plan:  #1 type 2 diabetes. Patient not monitoring blood sugars. He has lost some weight and hopefully this will be reflected with improved A1c control. Recheck A1c today. Continue weight loss efforts. Reassess 3 months #2 hypertension. Slightly improved. Continue weight loss efforts. No med changes today.

## 2014-03-10 NOTE — Progress Notes (Signed)
Pre visit review using our clinic review tool, if applicable. No additional management support is needed unless otherwise documented below in the visit note. 

## 2014-03-31 ENCOUNTER — Encounter: Payer: Self-pay | Admitting: Family Medicine

## 2014-03-31 ENCOUNTER — Ambulatory Visit (INDEPENDENT_AMBULATORY_CARE_PROVIDER_SITE_OTHER): Payer: BC Managed Care – PPO | Admitting: Family Medicine

## 2014-03-31 VITALS — BP 134/68 | HR 104 | Temp 98.5°F | Ht 70.0 in | Wt 239.0 lb

## 2014-03-31 DIAGNOSIS — J069 Acute upper respiratory infection, unspecified: Secondary | ICD-10-CM

## 2014-03-31 MED ORDER — BENZONATATE 100 MG PO CAPS: 100.0000 mg | ORAL_CAPSULE | Freq: Two times a day (BID) | ORAL | Status: DC | PRN

## 2014-03-31 NOTE — Progress Notes (Signed)
Pre visit review using our clinic review tool, if applicable. No additional management support is needed unless otherwise documented below in the visit note. 

## 2014-03-31 NOTE — Patient Instructions (Signed)
INSTRUCTIONS FOR UPPER RESPIRATORY INFECTION:  -plenty of rest and fluids  -nasal saline wash 2-3 times daily (use prepackaged nasal saline or bottled/distilled water if making your own)   -can use tylenol or ibuprofen as directed for aches and sorethroat if you are able to take these medications  -in the winter time, using a humidifier at night is helpful (please follow cleaning instructions)  -if you are taking a cough medication - use only as directed, may also try a teaspoon of honey to coat the throat and throat lozenges  -for sore throat, salt water gargles can help  -follow up if you have fevers, facial pain, tooth pain, difficulty breathing or are worsening or not getting better in 5-7 days

## 2014-03-31 NOTE — Progress Notes (Signed)
No chief complaint on file.   HPI:  -started: 2 days ago -symptoms:nasal congestion, sore throat, cough -denies:fever, SOB, NVD, tooth pain, sinus pain -has tried: alkaseltzer day and night helped - on dymista, sinugulair, xyzol  - sees Dr. Elenor Legato  -sick contacts/travel/risks: denies flu exposure, tick exposure or or Ebola risks -Hx of: allergies -tessalon perles worked great last time he was here and wonders if he can use  ROS: See pertinent positives and negatives per HPI.  Past Medical History  Diagnosis Date  . Neoplasm of uncertain behavior of skin 02/07/2009  . DIABETES MELLITUS, TYPE II 08/07/2006  . HYPERLIPIDEMIA 08/07/2006  . ERECTILE DYSFUNCTION 11/12/2006  . HYPERTENSION 08/07/2006  . ALLERGIC RHINITIS 11/12/2006  . Extrinsic asthma, unspecified 05/11/2008  . ECZEMA 11/08/2008  . OSTEOARTHRITIS 08/07/2006  . PLANTAR FASCIITIS 11/12/2006  . GENERALIZED VACCINIA AS COMP MEDICAL CARE NEC 09/09/2010    Past Surgical History  Procedure Laterality Date  . Knee arthroscopy      right  . Tonsillectomy and adenoidectomy  1955  . Wisdom tooth extraction      Family History  Problem Relation Age of Onset  . Diabetes Mother   . Alzheimer's disease Father     died age 31  . Diabetes Sister     4 sisters with diabetes  . Diabetes Brother   . Diabetes Daughter     History   Social History  . Marital Status: Married    Spouse Name: N/A    Number of Children: N/A  . Years of Education: N/A   Social History Main Topics  . Smoking status: Former Smoker -- 2.00 packs/day for 20 years    Types: Cigarettes    Quit date: 03/10/1987  . Smokeless tobacco: None  . Alcohol Use: Yes  . Drug Use: No  . Sexual Activity: None   Other Topics Concern  . None   Social History Narrative  . None    Current outpatient prescriptions:ACTOPLUS MET XR 15-1000 MG TB24, TAKE 1 TABLET BY MOUTH EVERY DAY, Disp: 90 tablet, Rfl: 2;  amLODipine-valsartan (EXFORGE) 10-320 MG per tablet, TAKE 1  TABLET BY MOUTH EVERY DAY, Disp: 90 tablet, Rfl: 0;  aspirin 81 MG tablet, Take 81 mg by mouth daily.  , Disp: , Rfl: ;  atorvastatin (LIPITOR) 20 MG tablet, Take 20 mg by mouth daily. Take brand only, Disp: , Rfl:  Canagliflozin (INVOKANA) 100 MG TABS, Take 1 tablet (100 mg total) by mouth daily., Disp: 30 tablet, Rfl: 5;  chlorpheniramine (CHLOR-TRIMETON) 4 MG tablet, Take 4 mg by mouth daily., Disp: , Rfl: ;  DYMISTA 137-50 MCG/ACT SUSP, One spray to each nostril twice daily, Disp: , Rfl: ;  Fluticasone-Salmeterol (ADVAIR) 250-50 MCG/DOSE AEPB, Inhale 1 puff into the lungs every 12 (twelve) hours.  , Disp: , Rfl:  Insulin Pen Needle 31G X 8 MM MISC, Use pen needles with Victoza daily as instructed, Disp: 100 each, Rfl: 3;  ipratropium (ATROVENT) 0.06 % nasal spray, Place 2 sprays into both nostrils 4 (four) times daily. For nasal congestion and nasal drainage., Disp: 15 mL, Rfl: 0;  levocetirizine (XYZAL) 5 MG tablet, Take 5 mg by mouth every evening.  , Disp: , Rfl:  Liraglutide (VICTOZA) 18 MG/3ML SOPN, Inject 1.2 mg into the skin daily., Disp: 9 mL, Rfl: 3;  lisinopril-hydrochlorothiazide (PRINZIDE,ZESTORETIC) 20-25 MG per tablet, TAKE 1 TABLET BY MOUTH EVERY DAY, Disp: 90 tablet, Rfl: 3;  montelukast (SINGULAIR) 10 MG tablet, Take 10 mg by mouth at  bedtime.  , Disp: , Rfl: ;  NOVOFINE 32G X 6 MM MISC, USE ONCE DAILY AS DIRECTED, Disp: 100 each, Rfl: 3  EXAM:  Filed Vitals:   03/31/14 1251  BP: 134/68  Pulse: 104  Temp: 98.5 F (36.9 C)    Body mass index is 34.29 kg/(m^2).  GENERAL: vitals reviewed and listed above, alert, oriented, appears well hydrated and in no acute distress  HEENT: atraumatic, conjunttiva clear, no obvious abnormalities on inspection of external nose and ears, normal appearance of ear canals and TMs, clear nasal congestion, mild post oropharyngeal erythema with PND, no tonsillar edema or exudate, no sinus TTP  NECK: no obvious masses on inspection  LUNGS: clear to  auscultation bilaterally, no wheezes, rales or rhonchi, good air movement  CV: HRRR, no peripheral edema  MS: moves all extremities without noticeable abnormality  PSYCH: pleasant and cooperative, no obvious depression or anxiety  ASSESSMENT AND PLAN:  Discussed the following assessment and plan:  Upper respiratory infection  -given HPI and exam findings today, a serious infection or illness is unlikely. We discussed potential etiologies, with VURI being most likely, and advised supportive care and monitoring. We discussed treatment side effects, likely course, antibiotic misuse, transmission, and signs of developing a serious illness. -of course, we advised to return or notify a doctor immediately if symptoms worsen or persist or new concerns arise.    Patient Instructions  INSTRUCTIONS FOR UPPER RESPIRATORY INFECTION:  -plenty of rest and fluids  -nasal saline wash 2-3 times daily (use prepackaged nasal saline or bottled/distilled water if making your own)   -can use sinex nasal spray for drainage and nasal congestion - but do NOT use longer then 3-4 days  -can use tylenol or ibuprofen as directed for aches and sorethroat  -in the winter time, using a humidifier at night is helpful (please follow cleaning instructions)  -if you are taking a cough medication - use only as directed, may also try a teaspoon of honey to coat the throat and throat lozenges  -for sore throat, salt water gargles can help  -follow up if you have fevers, facial pain, tooth pain, difficulty breathing or are worsening or not getting better in 5-7 days      KIM, HANNAH R.

## 2014-04-02 ENCOUNTER — Other Ambulatory Visit: Payer: Self-pay | Admitting: Family Medicine

## 2014-04-13 ENCOUNTER — Other Ambulatory Visit: Payer: Self-pay | Admitting: Family Medicine

## 2014-04-26 ENCOUNTER — Other Ambulatory Visit: Payer: Self-pay | Admitting: Family Medicine

## 2014-04-26 DIAGNOSIS — E785 Hyperlipidemia, unspecified: Secondary | ICD-10-CM

## 2014-04-26 DIAGNOSIS — E119 Type 2 diabetes mellitus without complications: Secondary | ICD-10-CM

## 2014-06-01 ENCOUNTER — Other Ambulatory Visit: Payer: Self-pay | Admitting: Family Medicine

## 2014-06-13 ENCOUNTER — Ambulatory Visit (INDEPENDENT_AMBULATORY_CARE_PROVIDER_SITE_OTHER): Payer: BC Managed Care – PPO | Admitting: Family Medicine

## 2014-06-13 ENCOUNTER — Encounter: Payer: Self-pay | Admitting: Family Medicine

## 2014-06-13 VITALS — BP 140/70 | HR 92 | Temp 98.0°F | Wt 242.0 lb

## 2014-06-13 DIAGNOSIS — E119 Type 2 diabetes mellitus without complications: Secondary | ICD-10-CM

## 2014-06-13 DIAGNOSIS — I10 Essential (primary) hypertension: Secondary | ICD-10-CM

## 2014-06-13 DIAGNOSIS — E785 Hyperlipidemia, unspecified: Secondary | ICD-10-CM

## 2014-06-13 LAB — HEPATIC FUNCTION PANEL
ALK PHOS: 78 U/L (ref 39–117)
ALT: 24 U/L (ref 0–53)
AST: 21 U/L (ref 0–37)
Albumin: 3.8 g/dL (ref 3.5–5.2)
Bilirubin, Direct: 0 mg/dL (ref 0.0–0.3)
Total Bilirubin: 0.7 mg/dL (ref 0.2–1.2)
Total Protein: 7.3 g/dL (ref 6.0–8.3)

## 2014-06-13 LAB — LIPID PANEL
CHOL/HDL RATIO: 3
Cholesterol: 148 mg/dL (ref 0–200)
HDL: 48.1 mg/dL (ref >39.00)
LDL Cholesterol: 76 mg/dL (ref 0–99)
NONHDL: 99.9
Triglycerides: 122 mg/dL (ref 0.0–149.0)
VLDL: 24.4 mg/dL (ref 0.0–40.0)

## 2014-06-13 LAB — BASIC METABOLIC PANEL
BUN: 20 mg/dL (ref 6–23)
CALCIUM: 9.1 mg/dL (ref 8.4–10.5)
CO2: 27 meq/L (ref 19–32)
CREATININE: 1.3 mg/dL (ref 0.4–1.5)
Chloride: 101 mEq/L (ref 96–112)
GFR: 56.93 mL/min — AB (ref >60.00)
GLUCOSE: 104 mg/dL — AB (ref 70–99)
Potassium: 4.1 mEq/L (ref 3.5–5.1)
Sodium: 138 mEq/L (ref 135–145)

## 2014-06-13 LAB — HEMOGLOBIN A1C: Hgb A1c MFr Bld: 8 % — ABNORMAL HIGH (ref 4.6–6.5)

## 2014-06-13 NOTE — Progress Notes (Signed)
   Subjective:    Patient ID: Peretz Thieme, male    DOB: 27-Jun-1951, 63 y.o.   MRN: [id number]  HPI Followup type 2 diabetes Last A1c 7.5% which was improved. This is followed 8 pounds of weight loss. Medication reviewed and compliant with all.  Hypertension treated with 2 drug regimen. Blood pressure stable by home readings. No dizziness. No chest pains. No headaches. No consistent exercise.  Hyperlipidemia on Lipitor.  No major myalgias.  Compliant with therapy.  Past Medical History  Diagnosis Date  . Neoplasm of uncertain behavior of skin 02/07/2009  . DIABETES MELLITUS, TYPE II 08/07/2006  . HYPERLIPIDEMIA 08/07/2006  . ERECTILE DYSFUNCTION 11/12/2006  . HYPERTENSION 08/07/2006  . ALLERGIC RHINITIS 11/12/2006  . Extrinsic asthma, unspecified 05/11/2008  . ECZEMA 11/08/2008  . OSTEOARTHRITIS 08/07/2006  . PLANTAR FASCIITIS 11/12/2006  . GENERALIZED VACCINIA AS COMP MEDICAL CARE NEC 09/09/2010   Past Surgical History  Procedure Laterality Date  . Knee arthroscopy      right  . Tonsillectomy and adenoidectomy  1955  . Wisdom tooth extraction      reports that he quit smoking about 27 years ago. His smoking use included Cigarettes. He has a 40 pack-year smoking history. He does not have any smokeless tobacco history on file. He reports that he drinks alcohol. He reports that he does not use illicit drugs. family history includes Alzheimer's disease in his father; Diabetes in his brother, daughter, mother, and sister. Allergies  Allergen Reactions  . Tetracycline     REACTION: rash      Review of Systems  Constitutional: Negative for fatigue.  Eyes: Negative for visual disturbance.  Respiratory: Negative for cough, chest tightness and shortness of breath.   Cardiovascular: Negative for chest pain, palpitations and leg swelling.  Endocrine: Negative for polydipsia and polyuria.  Neurological: Negative for dizziness, syncope, weakness, light-headedness and headaches.         Objective:   Physical Exam  Constitutional: He is oriented to person, place, and time. He appears well-developed and well-nourished.  HENT:  Right Ear: External ear normal.  Left Ear: External ear normal.  Mouth/Throat: Oropharynx is clear and moist.  Eyes: Pupils are equal, round, and reactive to light.  Neck: Neck supple. No thyromegaly present.  Cardiovascular: Normal rate and regular rhythm.   Pulmonary/Chest: Effort normal and breath sounds normal. No respiratory distress. He has no wheezes. He has no rales.  Musculoskeletal: He exhibits no edema.  Neurological: He is alert and oriented to person, place, and time.          Assessment & Plan:  #1 type 2 diabetes. Recent suboptimal control. Weight loss encouraged. Eye exam scheduled for the 24th of this month. Recheck A1c. Consider increasing dosage of Invokana-if  A1c still elevated above goal #2 hypertension. Slightly elevated today. He is encouraged to lose weight and reassess 3 months #3 hyperlipidemia treated with Lipitor.  Needs follow up labs.

## 2014-06-13 NOTE — Progress Notes (Signed)
Pre visit review using our clinic review tool, if applicable. No additional management support is needed unless otherwise documented below in the visit note. 

## 2014-06-14 ENCOUNTER — Other Ambulatory Visit: Payer: Self-pay

## 2014-06-14 LAB — HM DIABETES EYE EXAM

## 2014-06-14 LAB — HM DIABETES FOOT EXAM: HM DIABETIC FOOT EXAM: NORMAL

## 2014-06-14 MED ORDER — CANAGLIFLOZIN 300 MG PO TABS: 300.0000 mg | ORAL_TABLET | Freq: Every day | ORAL | Status: DC

## 2014-07-19 ENCOUNTER — Other Ambulatory Visit: Payer: Self-pay | Admitting: Family Medicine

## 2014-07-27 LAB — HM DIABETES EYE EXAM

## 2014-08-29 ENCOUNTER — Encounter: Payer: Self-pay | Admitting: Family Medicine

## 2014-09-08 ENCOUNTER — Other Ambulatory Visit: Payer: Self-pay | Admitting: Family Medicine

## 2014-09-13 ENCOUNTER — Ambulatory Visit (INDEPENDENT_AMBULATORY_CARE_PROVIDER_SITE_OTHER): Payer: BC Managed Care – PPO | Admitting: Family Medicine

## 2014-09-13 ENCOUNTER — Encounter: Payer: Self-pay | Admitting: Family Medicine

## 2014-09-13 VITALS — BP 130/68 | HR 100 | Temp 97.9°F | Wt 244.0 lb

## 2014-09-13 DIAGNOSIS — Z23 Encounter for immunization: Secondary | ICD-10-CM

## 2014-09-13 DIAGNOSIS — H9313 Tinnitus, bilateral: Secondary | ICD-10-CM

## 2014-09-13 DIAGNOSIS — IMO0002 Reserved for concepts with insufficient information to code with codable children: Secondary | ICD-10-CM

## 2014-09-13 DIAGNOSIS — E1165 Type 2 diabetes mellitus with hyperglycemia: Secondary | ICD-10-CM

## 2014-09-13 DIAGNOSIS — H6123 Impacted cerumen, bilateral: Secondary | ICD-10-CM

## 2014-09-13 LAB — HEMOGLOBIN A1C: Hgb A1c MFr Bld: 8 % — ABNORMAL HIGH (ref 4.6–6.5)

## 2014-09-13 NOTE — Progress Notes (Signed)
   Subjective:    Patient ID: Richard Dalton, male    DOB: Feb 02, 1951, 63 y.o.   MRN: [id number]  HPI Patient here for follow-up type 2 diabetes. Recent A1c 8.0%. Takes multiple medications as outlined. We did increase his Invokana to 300 mg once daily. He's had some mild urine frequency. Does not monitor sugars regularly. Recent eye exam unremarkable. No neuropathy symptoms. No UTI symptoms.  Has had some bilateral tinnitus but no sudden hearing changes. No vertigo. History of cerumen impactions in the past. No headaches.  Past Medical History  Diagnosis Date  . Neoplasm of uncertain behavior of skin 02/07/2009  . DIABETES MELLITUS, TYPE II 08/07/2006  . HYPERLIPIDEMIA 08/07/2006  . ERECTILE DYSFUNCTION 11/12/2006  . HYPERTENSION 08/07/2006  . ALLERGIC RHINITIS 11/12/2006  . Extrinsic asthma, unspecified 05/11/2008  . ECZEMA 11/08/2008  . OSTEOARTHRITIS 08/07/2006  . PLANTAR FASCIITIS 11/12/2006  . GENERALIZED VACCINIA AS COMP MEDICAL CARE NEC 09/09/2010   Past Surgical History  Procedure Laterality Date  . Knee arthroscopy      right  . Tonsillectomy and adenoidectomy  1955  . Wisdom tooth extraction      reports that he quit smoking about 27 years ago. His smoking use included Cigarettes. He has a 40 pack-year smoking history. He does not have any smokeless tobacco history on file. He reports that he drinks alcohol. He reports that he does not use illicit drugs. family history includes Alzheimer's disease in his father; Diabetes in his brother, daughter, mother, and sister. Allergies  Allergen Reactions  . Tetracycline     REACTION: rash      Review of Systems  Constitutional: Negative for fever and chills.  HENT: Negative for congestion, ear discharge and ear pain.   Eyes: Negative for visual disturbance.  Respiratory: Negative for cough and shortness of breath.   Cardiovascular: Negative for chest pain.  Endocrine: Negative for polydipsia and polyuria.       Objective:   Physical  Exam  Constitutional: He appears well-developed and well-nourished.  HENT:  Bilateral cerumen impactions right greater than left  Neck: Neck supple. No thyromegaly present.  Cardiovascular: Normal rate and regular rhythm.   Pulmonary/Chest: Effort normal and breath sounds normal. No respiratory distress. He has no wheezes. He has no rales.  Musculoskeletal: He exhibits no edema.          Assessment & Plan:  #1 type 2 diabetes. History of suboptimal control. Recheck A1c. May need to consider addition of long-acting once daily insulin #2 tinnitus.  Uncertain etiology. We explained etiologies frequently difficult to determine. He does have bilateral cervical impactions so start with irrigation. Wax removed bilaterally with irrigation.  Did note some reduction with removal of cerumen. #3 cerumen impaction bilaterally. Removed with irrigation

## 2014-09-13 NOTE — Progress Notes (Signed)
Pre visit review using our clinic review tool, if applicable. No additional management support is needed unless otherwise documented below in the visit note. 

## 2014-09-26 ENCOUNTER — Other Ambulatory Visit: Payer: Self-pay | Admitting: Family Medicine

## 2014-10-02 ENCOUNTER — Telehealth: Payer: Self-pay | Admitting: Family Medicine

## 2014-10-02 MED ORDER — AMLODIPINE BESYLATE-VALSARTAN 10-320 MG PO TABS: 1.0000 | ORAL_TABLET | Freq: Every day | ORAL | Status: DC

## 2014-10-02 MED ORDER — ATORVASTATIN CALCIUM 20 MG PO TABS: 20.0000 mg | ORAL_TABLET | Freq: Every day | ORAL | Status: DC

## 2014-10-02 MED ORDER — CANAGLIFLOZIN 300 MG PO TABS: 300.0000 mg | ORAL_TABLET | Freq: Every day | ORAL | Status: DC

## 2014-10-02 MED ORDER — PIOGLITAZONE HCL-METFORMIN ER 15-1000 MG PO TB24: 1.0000 | ORAL_TABLET | Freq: Every day | ORAL | Status: DC

## 2014-10-02 MED ORDER — LISINOPRIL-HYDROCHLOROTHIAZIDE 20-25 MG PO TABS: 1.0000 | ORAL_TABLET | Freq: Every day | ORAL | Status: DC

## 2014-10-02 MED ORDER — LIRAGLUTIDE 18 MG/3ML ~~LOC~~ SOPN: PEN_INJECTOR | SUBCUTANEOUS | Status: DC

## 2014-10-02 NOTE — Telephone Encounter (Signed)
Pt would like new rxs to send to mail order pharm lipitor 20 mg, victoza,invokana,amlodipine-valsartan,lisinopril-hctz and actoplus met. Pt needs 90 day supply w/refills

## 2014-10-02 NOTE — Telephone Encounter (Signed)
Left message on VM that Rx's are ready for pickup

## 2014-10-02 NOTE — Telephone Encounter (Signed)
Patient states he needs the hard scripts so he can mail it in to his new mail order pharmacy.

## 2014-11-19 ENCOUNTER — Encounter: Payer: Self-pay | Admitting: Family Medicine

## 2014-11-20 ENCOUNTER — Encounter: Payer: Self-pay | Admitting: Family Medicine

## 2014-12-12 ENCOUNTER — Ambulatory Visit (INDEPENDENT_AMBULATORY_CARE_PROVIDER_SITE_OTHER): Payer: BLUE CROSS/BLUE SHIELD | Admitting: Family Medicine

## 2014-12-12 ENCOUNTER — Encounter: Payer: Self-pay | Admitting: Family Medicine

## 2014-12-12 VITALS — BP 132/68 | HR 92 | Temp 98.2°F | Wt 239.0 lb

## 2014-12-12 DIAGNOSIS — IMO0002 Reserved for concepts with insufficient information to code with codable children: Secondary | ICD-10-CM

## 2014-12-12 DIAGNOSIS — E1165 Type 2 diabetes mellitus with hyperglycemia: Secondary | ICD-10-CM

## 2014-12-12 LAB — HEMOGLOBIN A1C: Hgb A1c MFr Bld: 7.9 % — ABNORMAL HIGH (ref 4.6–6.5)

## 2014-12-12 NOTE — Progress Notes (Signed)
Pre visit review using our clinic review tool, if applicable. No additional management support is needed unless otherwise documented below in the visit note. 

## 2014-12-12 NOTE — Progress Notes (Signed)
   Subjective:    Patient ID: Richard Dalton, male    DOB: 29-Dec-1950, 64 y.o.   MRN: [id number]  HPI Follow-up type 2 diabetes. History of poor control. Recent A1c 8.0%. He is on combination 3 drug therapy. We gave him option of starting Lantus insulin last visit but he deferred. He has lost 5 pounds since then. Has made some dietary changes. Not monitoring blood sugars regularly. No polyuria or polydipsia.  Past Medical History  Diagnosis Date  . Neoplasm of uncertain behavior of skin 02/07/2009  . DIABETES MELLITUS, TYPE II 08/07/2006  . HYPERLIPIDEMIA 08/07/2006  . ERECTILE DYSFUNCTION 11/12/2006  . HYPERTENSION 08/07/2006  . ALLERGIC RHINITIS 11/12/2006  . Extrinsic asthma, unspecified 05/11/2008  . ECZEMA 11/08/2008  . OSTEOARTHRITIS 08/07/2006  . PLANTAR FASCIITIS 11/12/2006  . GENERALIZED VACCINIA AS COMP MEDICAL CARE NEC 09/09/2010   Past Surgical History  Procedure Laterality Date  . Knee arthroscopy      right  . Tonsillectomy and adenoidectomy  1955  . Wisdom tooth extraction      reports that he quit smoking about 27 years ago. His smoking use included Cigarettes. He has a 40 pack-year smoking history. He does not have any smokeless tobacco history on file. He reports that he drinks alcohol. He reports that he does not use illicit drugs. family history includes Alzheimer's disease in his father; Diabetes in his brother, daughter, mother, and sister. Allergies  Allergen Reactions  . Tetracycline     REACTION: rash      Review of Systems  Constitutional: Negative for fatigue.  Eyes: Negative for visual disturbance.  Respiratory: Negative for cough, chest tightness and shortness of breath.   Cardiovascular: Negative for chest pain, palpitations and leg swelling.  Endocrine: Negative for polydipsia and polyuria.  Neurological: Negative for dizziness, syncope, weakness, light-headedness and headaches.       Objective:   Physical Exam  Constitutional: He appears well-developed  and well-nourished.  Neck: Neck supple. No thyromegaly present.  Cardiovascular: Normal rate and regular rhythm.  Exam reveals no gallop.   Pulmonary/Chest: Effort normal and breath sounds normal. No respiratory distress. He has no wheezes. He has no rales.  Musculoskeletal: He exhibits no edema.          Assessment & Plan:  Type 2 diabetes. History of poor control. Hopefully improved with recent weight loss efforts. Recheck A1c. Continue weight loss efforts. Routine follow-up 3 months

## 2015-02-04 IMAGING — CR DG CHEST 2V
2 series · 2 of 2 positions shown · non-contrast
Comparison: None.

CLINICAL DATA: Productive cough for the past 10 days.

EXAM:
CHEST  2 VIEW

[view not recorded (1 of 2)]
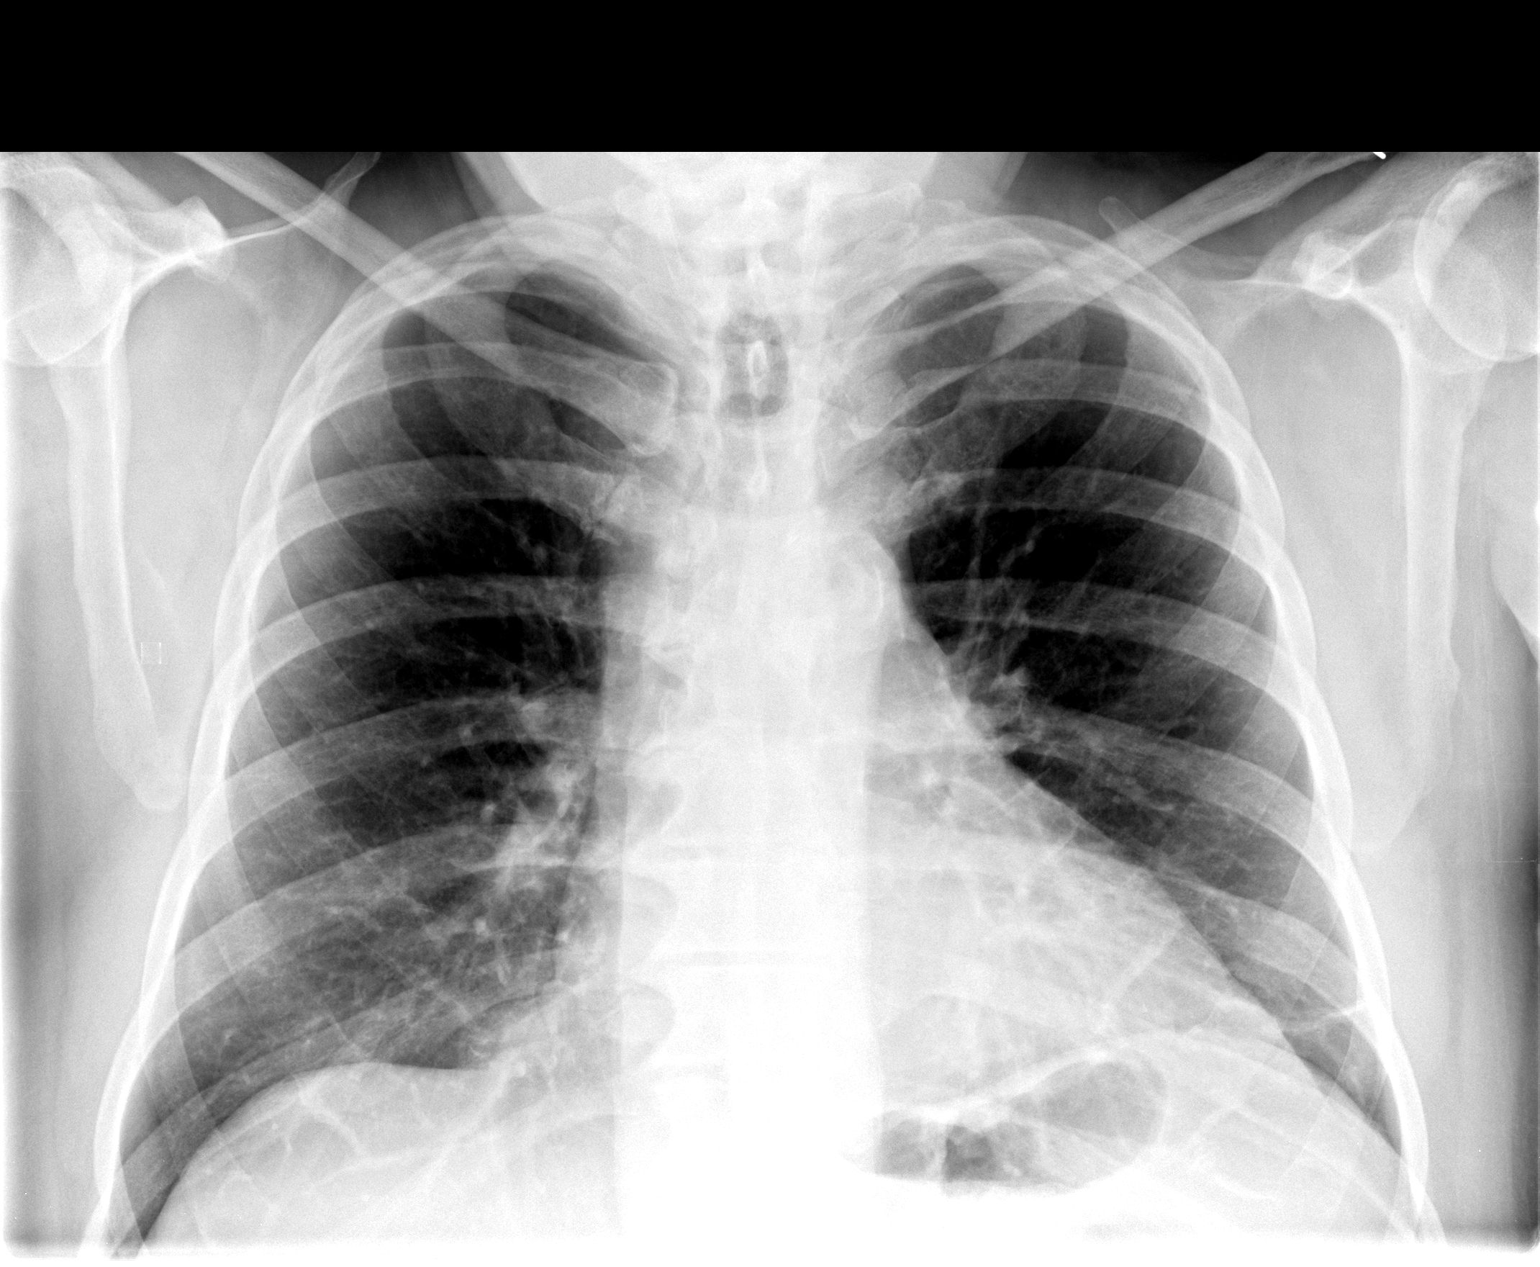

[view not recorded (2 of 2)]
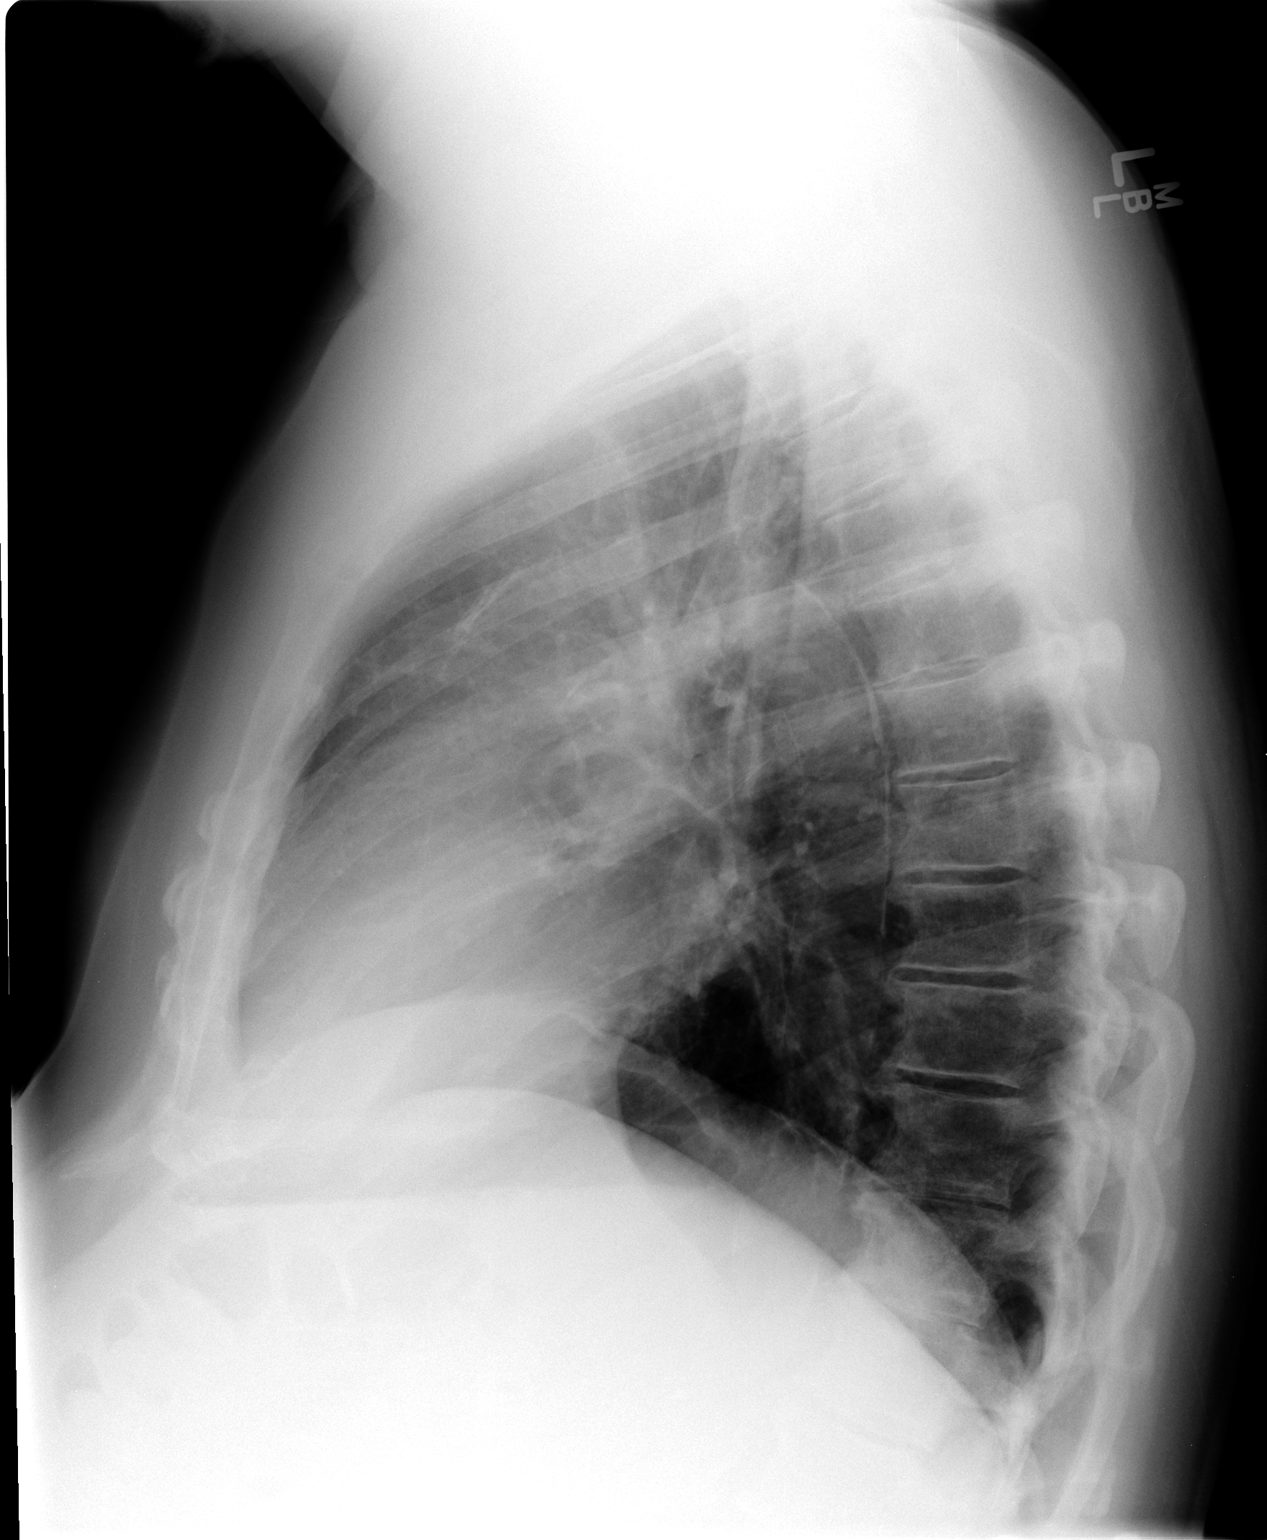

[2 of 2 positions shown; findings below may reference images not displayed]

FINDINGS: Borderline enlarged cardiac silhouette. Mild linear density at the
left lung base compatible with focal atelectasis or scarring.
Otherwise clear lungs. Minimal diffuse peribronchial thickening.
Thoracic spine degenerative changes.
IMPRESSION: Minimal bronchitic changes.

## 2015-02-08 ENCOUNTER — Encounter: Payer: Self-pay | Admitting: Family Medicine

## 2015-02-09 ENCOUNTER — Other Ambulatory Visit: Payer: Self-pay

## 2015-02-09 MED ORDER — INSULIN PEN NEEDLE 32G X 6 MM MISC: Status: DC

## 2015-03-14 ENCOUNTER — Ambulatory Visit (INDEPENDENT_AMBULATORY_CARE_PROVIDER_SITE_OTHER): Payer: BLUE CROSS/BLUE SHIELD | Admitting: Family Medicine

## 2015-03-14 ENCOUNTER — Ambulatory Visit: Payer: Medicare Other | Admitting: Family Medicine

## 2015-03-14 ENCOUNTER — Encounter: Payer: Self-pay | Admitting: Family Medicine

## 2015-03-14 VITALS — BP 134/66 | HR 104 | Temp 98.3°F | Wt 237.0 lb

## 2015-03-14 DIAGNOSIS — IMO0002 Reserved for concepts with insufficient information to code with codable children: Secondary | ICD-10-CM

## 2015-03-14 DIAGNOSIS — E1165 Type 2 diabetes mellitus with hyperglycemia: Secondary | ICD-10-CM | POA: Diagnosis not present

## 2015-03-14 LAB — HEMOGLOBIN A1C: HEMOGLOBIN A1C: 7.3 % — AB (ref 4.6–6.5)

## 2015-03-14 NOTE — Progress Notes (Signed)
   Subjective:    Patient ID: Richard Dalton, male    DOB: [birth year], 64 y.o.   MRN: [id number]  HPI Follow-up type 2 diabetes. Patient has been gradually losing some weight. Has lost 2 pounds since last visit. Last A1c 7.9%. He prefers to avoid insulin and try to manage this with further weight loss. Not monitoring blood sugars regularly. No polyuria or polydipsia. No recent chest pains. Medications reviewed. Compliant with all.  Past Medical History  Diagnosis Date  . Neoplasm of uncertain behavior of skin 02/07/2009  . DIABETES MELLITUS, TYPE II 08/07/2006  . HYPERLIPIDEMIA 08/07/2006  . ERECTILE DYSFUNCTION 11/12/2006  . HYPERTENSION 08/07/2006  . ALLERGIC RHINITIS 11/12/2006  . Extrinsic asthma, unspecified 05/11/2008  . ECZEMA 11/08/2008  . OSTEOARTHRITIS 08/07/2006  . PLANTAR FASCIITIS 11/12/2006  . GENERALIZED VACCINIA AS COMP MEDICAL CARE NEC 09/09/2010   Past Surgical History  Procedure Laterality Date  . Knee arthroscopy      right  . Tonsillectomy and adenoidectomy  1955  . Wisdom tooth extraction      reports that he quit smoking about 28 years ago. His smoking use included Cigarettes. He has a 40 pack-year smoking history. He does not have any smokeless tobacco history on file. He reports that he drinks alcohol. He reports that he does not use illicit drugs. family history includes Alzheimer's disease in his father; Diabetes in his brother, daughter, mother, and sister. Allergies  Allergen Reactions  . Tetracycline     REACTION: rash      Review of Systems  Constitutional: Negative for fatigue and unexpected weight change.  Eyes: Negative for visual disturbance.  Respiratory: Negative for cough, chest tightness and shortness of breath.   Cardiovascular: Negative for chest pain, palpitations and leg swelling.  Endocrine: Negative for polydipsia and polyuria.  Neurological: Negative for dizziness, syncope, weakness, light-headedness and headaches.       Objective:   Physical Exam  Constitutional: He appears well-developed and well-nourished. No distress.  Neck: Neck supple. No JVD present.  Cardiovascular: Normal rate and regular rhythm.   Pulmonary/Chest: Effort normal and breath sounds normal. No respiratory distress. He has no wheezes. He has no rales.  Musculoskeletal: He exhibits no edema.          Assessment & Plan:  Type 2 diabetes. Gradually improving control. Recent fasting blood sugars consistently 120 or less. Continue weight loss efforts. Reassess A1c.

## 2015-03-14 NOTE — Progress Notes (Signed)
Pre visit review using our clinic review tool, if applicable. No additional management support is needed unless otherwise documented below in the visit note. 

## 2015-06-06 ENCOUNTER — Encounter: Payer: Self-pay | Admitting: Family Medicine

## 2015-06-14 ENCOUNTER — Encounter: Payer: Self-pay | Admitting: Family Medicine

## 2015-06-14 ENCOUNTER — Ambulatory Visit (INDEPENDENT_AMBULATORY_CARE_PROVIDER_SITE_OTHER): Payer: BLUE CROSS/BLUE SHIELD | Admitting: Family Medicine

## 2015-06-14 ENCOUNTER — Encounter: Payer: Self-pay | Admitting: Internal Medicine

## 2015-06-14 VITALS — BP 130/70 | HR 90 | Temp 98.3°F | Wt 241.0 lb

## 2015-06-14 DIAGNOSIS — Z1211 Encounter for screening for malignant neoplasm of colon: Secondary | ICD-10-CM

## 2015-06-14 DIAGNOSIS — IMO0002 Reserved for concepts with insufficient information to code with codable children: Secondary | ICD-10-CM

## 2015-06-14 DIAGNOSIS — Z23 Encounter for immunization: Secondary | ICD-10-CM | POA: Diagnosis not present

## 2015-06-14 DIAGNOSIS — E1165 Type 2 diabetes mellitus with hyperglycemia: Secondary | ICD-10-CM | POA: Diagnosis not present

## 2015-06-14 DIAGNOSIS — I10 Essential (primary) hypertension: Secondary | ICD-10-CM | POA: Diagnosis not present

## 2015-06-14 DIAGNOSIS — E785 Hyperlipidemia, unspecified: Secondary | ICD-10-CM | POA: Diagnosis not present

## 2015-06-14 LAB — HEPATIC FUNCTION PANEL
ALT: 19 U/L (ref 0–53)
AST: 17 U/L (ref 0–37)
Albumin: 4 g/dL (ref 3.5–5.2)
Alkaline Phosphatase: 81 U/L (ref 39–117)
BILIRUBIN TOTAL: 0.6 mg/dL (ref 0.2–1.2)
Bilirubin, Direct: 0.1 mg/dL (ref 0.0–0.3)
Total Protein: 7 g/dL (ref 6.0–8.3)

## 2015-06-14 LAB — BASIC METABOLIC PANEL
BUN: 27 mg/dL — AB (ref 6–23)
CO2: 30 meq/L (ref 19–32)
Calcium: 9.2 mg/dL (ref 8.4–10.5)
Chloride: 104 mEq/L (ref 96–112)
Creatinine, Ser: 1.39 mg/dL (ref 0.40–1.50)
GFR: 54.4 mL/min — ABNORMAL LOW (ref >60.00)
GLUCOSE: 148 mg/dL — AB (ref 70–99)
POTASSIUM: 4.4 meq/L (ref 3.5–5.1)
Sodium: 138 mEq/L (ref 135–145)

## 2015-06-14 LAB — HEMOGLOBIN A1C: HEMOGLOBIN A1C: 7.5 % — AB (ref 4.6–6.5)

## 2015-06-14 LAB — LIPID PANEL
CHOL/HDL RATIO: 3
Cholesterol: 150 mg/dL (ref 0–200)
HDL: 46.3 mg/dL (ref >39.00)
LDL CALC: 72 mg/dL (ref 0–99)
NONHDL: 103.58
Triglycerides: 159 mg/dL — ABNORMAL HIGH (ref 0.0–149.0)
VLDL: 31.8 mg/dL (ref 0.0–40.0)

## 2015-06-14 MED ORDER — ATORVASTATIN CALCIUM 20 MG PO TABS: 20.0000 mg | ORAL_TABLET | Freq: Every day | ORAL | Status: DC

## 2015-06-14 MED ORDER — CANAGLIFLOZIN 300 MG PO TABS
300.0000 mg | ORAL_TABLET | Freq: Every day | ORAL | Status: DC
Start: 2015-06-14 — End: 2015-07-19

## 2015-06-14 MED ORDER — LIRAGLUTIDE 18 MG/3ML ~~LOC~~ SOPN: PEN_INJECTOR | SUBCUTANEOUS | Status: DC

## 2015-06-14 MED ORDER — PIOGLITAZONE HCL-METFORMIN ER 15-1000 MG PO TB24: 1.0000 | ORAL_TABLET | Freq: Every day | ORAL | Status: DC

## 2015-06-14 MED ORDER — LISINOPRIL-HYDROCHLOROTHIAZIDE 20-25 MG PO TABS: 1.0000 | ORAL_TABLET | Freq: Every day | ORAL | Status: DC

## 2015-06-14 MED ORDER — AMLODIPINE BESYLATE-VALSARTAN 10-320 MG PO TABS: 1.0000 | ORAL_TABLET | Freq: Every day | ORAL | Status: DC

## 2015-06-14 NOTE — Addendum Note (Signed)
Addended by: Noe Gens E on: 06/14/2015 09:00 AM   Modules accepted: Orders

## 2015-06-14 NOTE — Progress Notes (Signed)
   Subjective:    Patient ID: Richard Dalton, male    DOB: 1951-06-24, 64 y.o.   MRN: [id number]  HPI Follow-up  Type 2 diabetes. Last A1c 7.3%. Not monitoring blood sugars. No polyuria or polydipsia. Unfortunately, he has gained 4 pounds since last visit. No consistent exercise  Hypertension treated with multidrug regimen. Blood pressure stable. No dizziness. No peripheral edema. No chest pains  Hyperlipidemia treated with Lipitor. No myalgias. No history of CAD  Had flu vaccine through work. Needs tetanus booster. Needs repeat colonoscopy. Last colonoscopy about 11 years ago  Past Medical History  Diagnosis Date  . Neoplasm of uncertain behavior of skin 02/07/2009  . DIABETES MELLITUS, TYPE II 08/07/2006  . HYPERLIPIDEMIA 08/07/2006  . ERECTILE DYSFUNCTION 11/12/2006  . HYPERTENSION 08/07/2006  . ALLERGIC RHINITIS 11/12/2006  . Extrinsic asthma, unspecified 05/11/2008  . ECZEMA 11/08/2008  . OSTEOARTHRITIS 08/07/2006  . PLANTAR FASCIITIS 11/12/2006  . GENERALIZED VACCINIA AS COMP MEDICAL CARE NEC 09/09/2010   Past Surgical History  Procedure Laterality Date  . Knee arthroscopy      right  . Tonsillectomy and adenoidectomy  1955  . Wisdom tooth extraction      reports that he quit smoking about 28 years ago. His smoking use included Cigarettes. He has a 40 pack-year smoking history. He does not have any smokeless tobacco history on file. He reports that he drinks alcohol. He reports that he does not use illicit drugs. family history includes Alzheimer's disease in his father; Diabetes in his brother, daughter, mother, and sister. Allergies  Allergen Reactions  . Tetracycline     REACTION: rash      Review of Systems  Constitutional: Negative for fatigue and unexpected weight change.  Eyes: Negative for visual disturbance.  Respiratory: Negative for cough, chest tightness and shortness of breath.   Cardiovascular: Negative for chest pain, palpitations and leg swelling.  Endocrine:  Negative for polydipsia and polyuria.  Neurological: Negative for dizziness, syncope, weakness, light-headedness and headaches.       Objective:   Physical Exam  Constitutional: He is oriented to person, place, and time. He appears well-developed and well-nourished.  HENT:  Right Ear: External ear normal.  Left Ear: External ear normal.  Mouth/Throat: Oropharynx is clear and moist.  Eyes: Pupils are equal, round, and reactive to light.  Neck: Neck supple. No thyromegaly present.  Cardiovascular: Normal rate and regular rhythm.   Pulmonary/Chest: Effort normal and breath sounds normal. No respiratory distress. He has no wheezes. He has no rales.  Musculoskeletal: He exhibits no edema.  Neurological: He is alert and oriented to person, place, and time.  Skin:  Feet reveal no skin lesions. Good distal foot pulses. Good capillary refill. No calluses. Normal sensation with monofilament testing           Assessment & Plan:  #1 type 2 diabetes. History of suboptimal control. Recheck A1c. First to lose weight. We'll have to look addition of insulin if A1c is not controlled with the above #2 hypertension stable and at goal. Refill medications for one year #3 hyperlipidemia. Check lipid and hepatic panel. Refill Lipitor for one year #4 health maintenance. Schedule screening colonoscopy. Tetanus booster given. Patient does had flu vaccine through work

## 2015-06-14 NOTE — Progress Notes (Signed)
Pre visit review using our clinic review tool, if applicable. No additional management support is needed unless otherwise documented below in the visit note. 

## 2015-06-14 NOTE — Patient Instructions (Signed)
We will call you regarding colonoscopy. Continue with weight loss efforts.

## 2015-07-19 ENCOUNTER — Other Ambulatory Visit: Payer: Self-pay | Admitting: Family Medicine

## 2015-07-27 ENCOUNTER — Other Ambulatory Visit: Payer: Self-pay | Admitting: Family Medicine

## 2015-07-31 LAB — HM DIABETES EYE EXAM

## 2015-08-02 ENCOUNTER — Encounter: Payer: Self-pay | Admitting: Family Medicine

## 2015-08-07 ENCOUNTER — Ambulatory Visit (AMBULATORY_SURGERY_CENTER): Payer: Self-pay

## 2015-08-07 VITALS — Ht 71.0 in | Wt 243.6 lb

## 2015-08-07 DIAGNOSIS — Z860100 Personal history of colon polyps, unspecified: Secondary | ICD-10-CM

## 2015-08-07 HISTORY — PX: COLONOSCOPY: SHX174

## 2015-08-07 MED ORDER — SUPREP BOWEL PREP KIT 17.5-3.13-1.6 GM/177ML PO SOLN: 1.0000 | Freq: Once | ORAL | Status: DC

## 2015-08-07 NOTE — Progress Notes (Signed)
No allergies to eggs or soy No diet/weight loss meds No home oxygen No past problems with anesthesia  Has email  Emmi instructions not online at this time

## 2015-08-10 ENCOUNTER — Telehealth: Payer: Self-pay | Admitting: Internal Medicine

## 2015-08-21 ENCOUNTER — Ambulatory Visit (AMBULATORY_SURGERY_CENTER): Payer: BLUE CROSS/BLUE SHIELD | Admitting: Internal Medicine

## 2015-08-21 ENCOUNTER — Encounter: Payer: Self-pay | Admitting: Internal Medicine

## 2015-08-21 VITALS — BP 131/59 | HR 89 | Temp 97.5°F | Resp 22 | Ht 71.0 in | Wt 243.0 lb

## 2015-08-21 DIAGNOSIS — Z1211 Encounter for screening for malignant neoplasm of colon: Secondary | ICD-10-CM | POA: Diagnosis present

## 2015-08-21 DIAGNOSIS — D123 Benign neoplasm of transverse colon: Secondary | ICD-10-CM | POA: Diagnosis not present

## 2015-08-21 DIAGNOSIS — D122 Benign neoplasm of ascending colon: Secondary | ICD-10-CM

## 2015-08-21 DIAGNOSIS — Z860100 Personal history of colon polyps, unspecified: Secondary | ICD-10-CM

## 2015-08-21 MED ORDER — SODIUM CHLORIDE 0.9 % IV SOLN: 500.0000 mL | INTRAVENOUS | Status: DC

## 2015-08-21 NOTE — Progress Notes (Signed)
Report to PACU, RN, vss, BBS= Clear.  

## 2015-08-21 NOTE — Op Note (Signed)
[facility]  [address]. [place], [address]   COLONOSCOPY PROCEDURE REPORT  PATIENT: Richard Dalton, Richard Dalton  MR#: [id number] BIRTHDATE: [birth year] , 64  yrs. old GENDER: male ENDOSCOPIST: Eustace Quail, MD REFERRED [id number] Burchette, M.D. PROCEDURE DATE:  08/21/2015 PROCEDURE:   Colonoscopy, screening and Colonoscopy with snare polypectomy x 3 First Screening Colonoscopy - Avg.  risk and is 50 yrs.  old or older - No.  Prior Negative Screening - Now for repeat screening. N/A  History of Adenoma - Now for follow-up colonoscopy & has been > or = to 3 yrs.  N/A  Polyps removed today? Yes ASA CLASS:   Class II INDICATIONS:Screening for colonic neoplasia and Patient is not applicable for Colorectal Neoplasm Risk Assessment for this procedure.  he reports colonoscopy in [place] 2002. No details. Question polyp(s) MEDICATIONS: Monitored anesthesia care and Propofol 300 mg IV  DESCRIPTION OF PROCEDURE:   After the risks benefits and alternatives of the procedure were thoroughly explained, informed consent was obtained.  The digital rectal exam revealed no abnormalities of the rectum.   The [id number]  endoscope was introduced through the anus and advanced to the cecum, which was identified by both the appendix and ileocecal valve. No adverse events experienced.   The quality of the prep was good.  (Suprep was used)  The instrument was then slowly withdrawn as the colon was fully examined. Estimated blood loss is zero unless otherwise noted in this procedure report.  COLON FINDINGS: Three polyps were found in the transverse colon (5 mm, 6 mm) and ascending colon(12 mm sessile).  A polypectomy was performed with a cold snare.  The resection was complete, the polyp tissue was completely retrieved and sent to histology.   There was moderate diverticulosis noted throughout the entire examined colon, but most prominent in the sigmoid region.   The  examination was otherwise normal.  Retroflexed views revealed no abnormalities. The time to cecum = 3.4 Withdrawal time = 15.3   The scope was withdrawn and the procedure completed.  COMPLICATIONS: There were no immediate complications.  ENDOSCOPIC IMPRESSION: 1.   Three polyps were found in the transverse colon and ascending colon; polypectomy was performed with a cold snare 2.   Moderate diverticulosis was noted throughout the entire examined colon 3.   The examination was otherwise normal  RECOMMENDATIONS: 1. Repeat Colonoscopy in 3 years (multiple and large polyp).  eSigned:  Eustace Quail, MD 08/21/2015 11:55 AM   cc: The Patient and Carolann Littler, MD

## 2015-08-21 NOTE — Patient Instructions (Signed)
YOU HAD AN ENDOSCOPIC PROCEDURE TODAY AT THE [facility]:   Refer to the procedure report that was given to you for any specific questions about what was found during the examination.  If the procedure report does not answer your questions, please call your gastroenterologist to clarify.  If you requested that your care partner not be given the details of your procedure findings, then the procedure report has been included in a sealed envelope for you to review at your convenience later.  YOU SHOULD EXPECT: Some feelings of bloating in the abdomen. Passage of more gas than usual.  Walking can help get rid of the air that was put into your GI tract during the procedure and reduce the bloating. If you had a lower endoscopy (such as a colonoscopy or flexible sigmoidoscopy) you may notice spotting of blood in your stool or on the toilet paper. If you underwent a bowel prep for your procedure, you may not have a normal bowel movement for a few days.  Please Note:  You might notice some irritation and congestion in your nose or some drainage.  This is from the oxygen used during your procedure.  There is no need for concern and it should clear up in a day or so.  SYMPTOMS TO REPORT IMMEDIATELY:   Following lower endoscopy (colonoscopy or flexible sigmoidoscopy):  Excessive amounts of blood in the stool  Significant tenderness or worsening of abdominal pains  Swelling of the abdomen that is new, acute  Fever of 100F or higher  For urgent or emergent issues, a gastroenterologist can be reached at any hour by calling [phone].   DIET: Your first meal following the procedure should be a small meal and then it is ok to progress to your normal diet. Heavy or fried foods are harder to digest and may make you feel nauseous or bloated.  Likewise, meals heavy in dairy and vegetables can increase bloating.  Drink plenty of fluids but you should avoid alcoholic beverages for 24  hours.  ACTIVITY:  You should plan to take it easy for the rest of today and you should NOT DRIVE or use heavy machinery until tomorrow (because of the sedation medicines used during the test).    FOLLOW UP: Our staff will call the number listed on your records the next business day following your procedure to check on you and address any questions or concerns that you may have regarding the information given to you following your procedure. If we do not reach you, we will leave a message.  However, if you are feeling well and you are not experiencing any problems, there is no need to return our call.  We will assume that you have returned to your regular daily activities without incident.  If any biopsies were taken you will be contacted by phone or by letter within the next 1-3 weeks.  Please call us at [phone] if you have not heard about the biopsies in 3 weeks.    SIGNATURES/CONFIDENTIALITY: You and/or your care partner have signed paperwork which will be entered into your electronic medical record.  These signatures attest to the fact that that the information above on your After Visit Summary has been reviewed and is understood.  Full responsibility of the confidentiality of this discharge information lies with you and/or your care-partner.  Please continue your normal medications  Please read over handouts about polyps, diverticulosis and high fiber diets 

## 2015-08-21 NOTE — Progress Notes (Signed)
Called to room to assist during endoscopic procedure.  Patient ID and intended procedure confirmed with present staff. Received instructions for my participation in the procedure from the performing physician.  

## 2015-08-22 ENCOUNTER — Telehealth: Payer: Self-pay | Admitting: *Deleted

## 2015-08-22 NOTE — Telephone Encounter (Signed)
Name identifier, left message, follow-up 

## 2015-08-28 ENCOUNTER — Encounter: Payer: Self-pay | Admitting: Internal Medicine

## 2015-09-06 ENCOUNTER — Other Ambulatory Visit: Payer: Self-pay | Admitting: Family Medicine

## 2015-09-12 ENCOUNTER — Ambulatory Visit (INDEPENDENT_AMBULATORY_CARE_PROVIDER_SITE_OTHER): Payer: BLUE CROSS/BLUE SHIELD | Admitting: Family Medicine

## 2015-09-12 ENCOUNTER — Encounter: Payer: Self-pay | Admitting: Family Medicine

## 2015-09-12 VITALS — BP 128/60 | HR 96 | Temp 98.2°F | Resp 16 | Ht 71.0 in | Wt 243.6 lb

## 2015-09-12 DIAGNOSIS — E1165 Type 2 diabetes mellitus with hyperglycemia: Secondary | ICD-10-CM | POA: Diagnosis not present

## 2015-09-12 DIAGNOSIS — IMO0001 Reserved for inherently not codable concepts without codable children: Secondary | ICD-10-CM

## 2015-09-12 LAB — HEMOGLOBIN A1C: HEMOGLOBIN A1C: 7.8 % — AB (ref 4.6–6.5)

## 2015-09-12 LAB — HM DIABETES FOOT EXAM: HM Diabetic Foot Exam: NORMAL

## 2015-09-12 NOTE — Progress Notes (Signed)
   Subjective:    Patient ID: Richard Dalton, male    DOB: August 26, 1951, 64 y.o.   MRN: [id number]  HPI Follow-up type 2 diabetes. Clinically stable. Fasting blood sugars pre-much unchanged. Takes 3 drug regimen. Recent A1c 7.5%. Weight is up 2 pounds from last visit. No consistent exercise. No recent polyuria or polydipsia. All medications reviewed. Compliant with all. Denies any side effects.  Past Medical History  Diagnosis Date  . Neoplasm of uncertain behavior of skin 02/07/2009  . DIABETES MELLITUS, TYPE II 08/07/2006  . HYPERLIPIDEMIA 08/07/2006  . ERECTILE DYSFUNCTION 11/12/2006  . HYPERTENSION 08/07/2006  . ALLERGIC RHINITIS 11/12/2006  . Extrinsic asthma, unspecified 05/11/2008  . ECZEMA 11/08/2008  . OSTEOARTHRITIS 08/07/2006  . PLANTAR FASCIITIS 11/12/2006  . GENERALIZED VACCINIA AS COMP MEDICAL CARE NEC 09/09/2010   Past Surgical History  Procedure Laterality Date  . Knee arthroscopy      right  . Tonsillectomy and adenoidectomy  1955  . Wisdom tooth extraction      reports that he quit smoking about 28 years ago. His smoking use included Cigarettes. He has a 40 pack-year smoking history. He has never used smokeless tobacco. He reports that he drinks about 1.2 oz of alcohol per week. He reports that he does not use illicit drugs. family history includes Alzheimer's disease in his father; Diabetes in his brother, daughter, mother, and sister. There is no history of Colon cancer, Esophageal cancer, Stomach cancer, or Rectal cancer. Allergies  Allergen Reactions  . Tetracycline     REACTION: rash      Review of Systems  Constitutional: Negative for fatigue and unexpected weight change.  Eyes: Negative for visual disturbance.  Respiratory: Negative for cough, chest tightness and shortness of breath.   Cardiovascular: Negative for chest pain, palpitations and leg swelling.  Endocrine: Negative for polydipsia and polyphagia.  Neurological: Negative for dizziness, syncope, weakness,  light-headedness and headaches.       Objective:   Physical Exam  Constitutional: He appears well-developed and well-nourished.  Cardiovascular: Normal rate and regular rhythm.   Pulmonary/Chest: Effort normal and breath sounds normal. No respiratory distress. He has no wheezes. He has no rales.  Musculoskeletal: He exhibits no edema.  Skin:  Feet reveal no skin lesions. Good distal foot pulses. Good capillary refill. No calluses. Normal sensation with monofilament testing           Assessment & Plan:  Type 2 diabetes. History of fair control. We have again encouraged more consistent exercise and weight loss. Recheck A1c. Hopefully can avoid insulin.  Eye exam is up-to-date

## 2015-09-12 NOTE — Progress Notes (Signed)
Pre visit review using our clinic review tool, if applicable. No additional management support is needed unless otherwise documented below in the visit note. 

## 2015-10-17 ENCOUNTER — Encounter: Payer: Self-pay | Admitting: Family Medicine

## 2015-10-18 NOTE — Telephone Encounter (Signed)
Will check on this PA? No sure if has been done or not.

## 2015-10-18 NOTE — Telephone Encounter (Signed)
Will you check on this PA for me please. Thanks.

## 2015-10-27 ENCOUNTER — Encounter: Payer: Self-pay | Admitting: Family Medicine

## 2015-11-02 ENCOUNTER — Other Ambulatory Visit: Payer: Self-pay | Admitting: Family Medicine

## 2015-11-02 MED ORDER — LIRAGLUTIDE 18 MG/3ML ~~LOC~~ SOPN: PEN_INJECTOR | SUBCUTANEOUS | Status: DC

## 2015-11-05 ENCOUNTER — Encounter: Payer: Self-pay | Admitting: Family Medicine

## 2015-11-05 ENCOUNTER — Other Ambulatory Visit: Payer: Self-pay | Admitting: Family Medicine

## 2015-11-05 MED ORDER — LIRAGLUTIDE 18 MG/3ML ~~LOC~~ SOPN: PEN_INJECTOR | SUBCUTANEOUS | Status: DC

## 2015-11-26 ENCOUNTER — Encounter: Payer: Self-pay | Admitting: Family Medicine

## 2015-12-04 ENCOUNTER — Other Ambulatory Visit: Payer: Self-pay | Admitting: Family Medicine

## 2015-12-04 ENCOUNTER — Encounter: Payer: Self-pay | Admitting: Family Medicine

## 2015-12-05 ENCOUNTER — Encounter: Payer: Self-pay | Admitting: Family Medicine

## 2015-12-11 ENCOUNTER — Ambulatory Visit (INDEPENDENT_AMBULATORY_CARE_PROVIDER_SITE_OTHER): Payer: BLUE CROSS/BLUE SHIELD | Admitting: Family Medicine

## 2015-12-11 ENCOUNTER — Encounter: Payer: Self-pay | Admitting: Family Medicine

## 2015-12-11 VITALS — BP 108/58 | HR 102 | Temp 99.0°F | Ht 71.0 in | Wt 237.9 lb

## 2015-12-11 DIAGNOSIS — R509 Fever, unspecified: Secondary | ICD-10-CM

## 2015-12-11 DIAGNOSIS — IMO0001 Reserved for inherently not codable concepts without codable children: Secondary | ICD-10-CM

## 2015-12-11 DIAGNOSIS — E1165 Type 2 diabetes mellitus with hyperglycemia: Secondary | ICD-10-CM | POA: Diagnosis not present

## 2015-12-11 LAB — POCT GLYCOSYLATED HEMOGLOBIN (HGB A1C): HEMOGLOBIN A1C: 7.4

## 2015-12-11 LAB — POCT INFLUENZA A/B
Influenza A, POC: NEGATIVE
Influenza B, POC: NEGATIVE

## 2015-12-11 NOTE — Progress Notes (Signed)
   Subjective:    Patient ID: Richard Dalton, male    DOB: 1951-04-10, 65 y.o.   MRN: [id number]  HPI  Patient seen for the following acute and chronic issue   Acute issue of onset this past Friday of nasal congestion and cough.  By Saturday he developed some body aches and low-grade fever around 100 which he's had intermittently since then. Increased fatigue and malaise. No nausea or vomiting. No sore throat.  Cough mostly nonproductive. No dysuria.   Type 2 diabetes. Last A1c 3 months ago 7.8%. He has managed to lose about 6 pounds of weight since then. He has been reluctant to consider insulin at this point. Takes several oral medications for diabetes. No recent polyuria or polydipsia. No recent hypoglycemia.  Past Medical History  Diagnosis Date  . Neoplasm of uncertain behavior of skin 02/07/2009  . DIABETES MELLITUS, TYPE II 08/07/2006  . HYPERLIPIDEMIA 08/07/2006  . ERECTILE DYSFUNCTION 11/12/2006  . HYPERTENSION 08/07/2006  . ALLERGIC RHINITIS 11/12/2006  . Extrinsic asthma, unspecified 05/11/2008  . ECZEMA 11/08/2008  . OSTEOARTHRITIS 08/07/2006  . PLANTAR FASCIITIS 11/12/2006  . GENERALIZED VACCINIA AS COMP MEDICAL CARE NEC 09/09/2010   Past Surgical History  Procedure Laterality Date  . Knee arthroscopy      right  . Tonsillectomy and adenoidectomy  1955  . Wisdom tooth extraction      reports that he quit smoking about 28 years ago. His smoking use included Cigarettes. He has a 40 pack-year smoking history. He has never used smokeless tobacco. He reports that he drinks about 1.2 oz of alcohol per week. He reports that he does not use illicit drugs. family history includes Alzheimer's disease in his father; Diabetes in his brother, daughter, mother, and sister. There is no history of Colon cancer, Esophageal cancer, Stomach cancer, or Rectal cancer. Allergies  Allergen Reactions  . Tetracycline     REACTION: rash      Review of Systems  Constitutional: Positive for fever, chills  and fatigue.  HENT: Positive for congestion. Negative for sore throat.   Respiratory: Positive for cough.   Gastrointestinal: Negative for abdominal pain.  Endocrine: Negative for polydipsia and polyuria.  Genitourinary: Negative for dysuria.  Musculoskeletal: Positive for myalgias.  Hematological: Negative for adenopathy.       Objective:   Physical Exam  Constitutional: He appears well-developed and well-nourished. No distress.  HENT:  Mouth/Throat: Oropharynx is clear and moist.  Moderate cerumen right canal. Left eardrum normal  Neck: Neck supple.  Cardiovascular: Normal rate and regular rhythm.   Pulmonary/Chest: Effort normal and breath sounds normal. No respiratory distress. He has no wheezes. He has no rales.  Musculoskeletal: He exhibits no edema.  Lymphadenopathy:    He has no cervical adenopathy.  Skin: No rash noted.          Assessment & Plan:    #1 febrile illness.Possible influenza.   Check rapid flu screen. If negative, treat symptomatically   #2 type 2 diabetes. History of sub-optimal control. Recheck A1c. Continue weight loss efforts.

## 2015-12-11 NOTE — Patient Instructions (Signed)
Your A1C today was 7.4%  Continue with weight loss efforts.

## 2015-12-11 NOTE — Progress Notes (Signed)
Pre visit review using our clinic review tool, if applicable. No additional management support is needed unless otherwise documented below in the visit note. 

## 2015-12-12 ENCOUNTER — Ambulatory Visit: Payer: BLUE CROSS/BLUE SHIELD | Admitting: Family Medicine

## 2016-03-14 ENCOUNTER — Encounter: Payer: Self-pay | Admitting: Family Medicine

## 2016-03-14 ENCOUNTER — Ambulatory Visit (INDEPENDENT_AMBULATORY_CARE_PROVIDER_SITE_OTHER): Payer: BLUE CROSS/BLUE SHIELD | Admitting: Family Medicine

## 2016-03-14 VITALS — BP 110/62 | HR 95 | Temp 98.5°F | Ht 71.0 in | Wt 240.0 lb

## 2016-03-14 DIAGNOSIS — E1165 Type 2 diabetes mellitus with hyperglycemia: Secondary | ICD-10-CM

## 2016-03-14 DIAGNOSIS — IMO0001 Reserved for inherently not codable concepts without codable children: Secondary | ICD-10-CM

## 2016-03-14 LAB — POCT GLYCOSYLATED HEMOGLOBIN (HGB A1C): Hemoglobin A1C: 7.6

## 2016-03-14 MED ORDER — INSULIN PEN NEEDLE 32G X 6 MM MISC: Status: DC

## 2016-03-14 NOTE — Progress Notes (Signed)
   Subjective:    Patient ID: Richard Dalton, male    DOB: 1951/03/07, 65 y.o.   MRN: [id number]  HPI Follow-up type 2 diabetes On multiple medications. Last A1c 7.4%. Weight is essentially stable. Still no consistent exercise. No polyuria or polydipsia. No history of complications. Medications reviewed. Compliant with all. Eye exam last October no retinopathy changes.  Past Medical History  Diagnosis Date  . Neoplasm of uncertain behavior of skin 02/07/2009  . DIABETES MELLITUS, TYPE II 08/07/2006  . HYPERLIPIDEMIA 08/07/2006  . ERECTILE DYSFUNCTION 11/12/2006  . HYPERTENSION 08/07/2006  . ALLERGIC RHINITIS 11/12/2006  . Extrinsic asthma, unspecified 05/11/2008  . ECZEMA 11/08/2008  . OSTEOARTHRITIS 08/07/2006  . PLANTAR FASCIITIS 11/12/2006  . GENERALIZED VACCINIA AS COMP MEDICAL CARE NEC 09/09/2010   Past Surgical History  Procedure Laterality Date  . Knee arthroscopy      right  . Tonsillectomy and adenoidectomy  1955  . Wisdom tooth extraction      reports that he quit smoking about 29 years ago. His smoking use included Cigarettes. He has a 40 pack-year smoking history. He has never used smokeless tobacco. He reports that he drinks about 1.2 oz of alcohol per week. He reports that he does not use illicit drugs. family history includes Alzheimer's disease in his father; Diabetes in his brother, daughter, mother, and sister. There is no history of Colon cancer, Esophageal cancer, Stomach cancer, or Rectal cancer. Allergies  Allergen Reactions  . Tetracycline     REACTION: rash       Review of Systems  Constitutional: Negative for fatigue.  Eyes: Negative for visual disturbance.  Respiratory: Negative for cough, chest tightness and shortness of breath.   Cardiovascular: Negative for chest pain, palpitations and leg swelling.  Endocrine: Negative for polydipsia and polyuria.  Neurological: Negative for dizziness, syncope, weakness, light-headedness and headaches.         Objective:   Physical Exam  Constitutional: He is oriented to person, place, and time. He appears well-developed and well-nourished.  HENT:  Right Ear: External ear normal.  Left Ear: External ear normal.  Mouth/Throat: Oropharynx is clear and moist.  Eyes: Pupils are equal, round, and reactive to light.  Neck: Neck supple. No thyromegaly present.  Cardiovascular: Normal rate and regular rhythm.   Pulmonary/Chest: Effort normal and breath sounds normal. No respiratory distress. He has no wheezes. He has no rales.  Musculoskeletal: He exhibits no edema.  Neurological: He is alert and oriented to person, place, and time.          Assessment & Plan:  Type 2 diabetes. History of borderline to suboptimal control. Recheck A1c. He is encouraged to lose some weight. Establish more consistent exercise. Routine follow-up 3 months. A1c 7.6%. Patient prefers weight loss and repeat monitoring in 3 months. If not improved at that point may consider long-acting insulin  [name] MD [facility]

## 2016-03-14 NOTE — Progress Notes (Signed)
Pre visit review using our clinic review tool, if applicable. No additional management support is needed unless otherwise documented below in the visit note. 

## 2016-04-01 ENCOUNTER — Other Ambulatory Visit: Payer: Self-pay | Admitting: Family Medicine

## 2016-05-17 ENCOUNTER — Other Ambulatory Visit: Payer: Self-pay | Admitting: Family Medicine

## 2016-06-17 ENCOUNTER — Ambulatory Visit (INDEPENDENT_AMBULATORY_CARE_PROVIDER_SITE_OTHER): Payer: BLUE CROSS/BLUE SHIELD | Admitting: Family Medicine

## 2016-06-17 ENCOUNTER — Encounter: Payer: Self-pay | Admitting: Family Medicine

## 2016-06-17 VITALS — BP 102/52 | HR 92 | Temp 97.9°F | Ht 71.0 in | Wt 244.2 lb

## 2016-06-17 DIAGNOSIS — E785 Hyperlipidemia, unspecified: Secondary | ICD-10-CM | POA: Diagnosis not present

## 2016-06-17 DIAGNOSIS — Z23 Encounter for immunization: Secondary | ICD-10-CM | POA: Diagnosis not present

## 2016-06-17 DIAGNOSIS — I10 Essential (primary) hypertension: Secondary | ICD-10-CM

## 2016-06-17 DIAGNOSIS — E1165 Type 2 diabetes mellitus with hyperglycemia: Secondary | ICD-10-CM | POA: Diagnosis not present

## 2016-06-17 DIAGNOSIS — IMO0001 Reserved for inherently not codable concepts without codable children: Secondary | ICD-10-CM

## 2016-06-17 LAB — BASIC METABOLIC PANEL
BUN: 30 mg/dL — ABNORMAL HIGH (ref 6–23)
CALCIUM: 9.1 mg/dL (ref 8.4–10.5)
CHLORIDE: 105 meq/L (ref 96–112)
CO2: 29 meq/L (ref 19–32)
Creatinine, Ser: 1.39 mg/dL (ref 0.40–1.50)
GFR: 54.23 mL/min — ABNORMAL LOW (ref >60.00)
GLUCOSE: 127 mg/dL — AB (ref 70–99)
POTASSIUM: 4.7 meq/L (ref 3.5–5.1)
SODIUM: 140 meq/L (ref 135–145)

## 2016-06-17 LAB — LIPID PANEL
CHOL/HDL RATIO: 4
Cholesterol: 166 mg/dL (ref 0–200)
HDL: 46.8 mg/dL (ref >39.00)
LDL Cholesterol: 89 mg/dL (ref 0–99)
NONHDL: 119.51
TRIGLYCERIDES: 153 mg/dL — AB (ref 0.0–149.0)
VLDL: 30.6 mg/dL (ref 0.0–40.0)

## 2016-06-17 LAB — HEPATIC FUNCTION PANEL
ALBUMIN: 4.2 g/dL (ref 3.5–5.2)
ALK PHOS: 80 U/L (ref 39–117)
ALT: 17 U/L (ref 0–53)
AST: 15 U/L (ref 0–37)
Bilirubin, Direct: 0.1 mg/dL (ref 0.0–0.3)
TOTAL PROTEIN: 6.9 g/dL (ref 6.0–8.3)
Total Bilirubin: 0.6 mg/dL (ref 0.2–1.2)

## 2016-06-17 LAB — POCT GLYCOSYLATED HEMOGLOBIN (HGB A1C): HEMOGLOBIN A1C: 7.4

## 2016-06-17 MED ORDER — LISINOPRIL-HYDROCHLOROTHIAZIDE 20-25 MG PO TABS: 1.0000 | ORAL_TABLET | Freq: Every day | ORAL | 2 refills | Status: DC

## 2016-06-17 MED ORDER — AMLODIPINE BESYLATE-VALSARTAN 10-320 MG PO TABS: 1.0000 | ORAL_TABLET | Freq: Every day | ORAL | 2 refills | Status: DC

## 2016-06-17 MED ORDER — ATORVASTATIN CALCIUM 20 MG PO TABS: 20.0000 mg | ORAL_TABLET | Freq: Every day | ORAL | 2 refills | Status: DC

## 2016-06-17 NOTE — Patient Instructions (Signed)
STOP the Exforge Monitor blood pressure and be in touch if BP consistently > 130/85

## 2016-06-17 NOTE — Progress Notes (Signed)
Subjective:     Patient ID: Richard Dalton, male   DOB: 1951-08-29, 65 y.o.   MRN: [id number]  HPI Medical follow-up  Type 2 diabetes. History of poor control. History of poor compliance with diet and exercise. Unfortunately, he has gained about 4 pounds since last visit. Last A1c 7.6%. Takes multiple medications and we have discussed possible addition of insulin. No recent hypoglycemia  Hypertension currently treated with combination of Exforge as well as lisinopril HCTZ. He apparently has been on this combination for several years even prior to coming to me. He's recently had some low blood pressure readings around [id number] systolic and 123456 diastolic. No consistent orthostatic symptoms.  Dyslipidemia treated with atorvastatin. No muscle aches. Needs flu vaccine. No recent chest pains.  Past Medical History:  Diagnosis Date  . ALLERGIC RHINITIS 11/12/2006  . DIABETES MELLITUS, TYPE II 08/07/2006  . ECZEMA 11/08/2008  . ERECTILE DYSFUNCTION 11/12/2006  . Extrinsic asthma, unspecified 05/11/2008  . GENERALIZED VACCINIA AS COMP MEDICAL CARE NEC 09/09/2010  . HYPERLIPIDEMIA 08/07/2006  . HYPERTENSION 08/07/2006  . Neoplasm of uncertain behavior of skin 02/07/2009  . OSTEOARTHRITIS 08/07/2006  . PLANTAR FASCIITIS 11/12/2006   Past Surgical History:  Procedure Laterality Date  . KNEE ARTHROSCOPY     right  . TONSILLECTOMY AND ADENOIDECTOMY  1955  . WISDOM TOOTH EXTRACTION      reports that he quit smoking about 29 years ago. His smoking use included Cigarettes. He has a 40.00 pack-year smoking history. He has never used smokeless tobacco. He reports that he drinks about 1.2 oz of alcohol per week . He reports that he does not use drugs. family history includes Alzheimer's disease in his father; Diabetes in his brother, daughter, mother, and sister. Allergies  Allergen Reactions  . Tetracycline     REACTION: rash     Review of Systems  Constitutional: Negative for fatigue.  Eyes: Negative for visual  disturbance.  Respiratory: Negative for cough, chest tightness and shortness of breath.   Cardiovascular: Negative for chest pain, palpitations and leg swelling.  Endocrine: Negative for polydipsia and polyuria.  Neurological: Negative for dizziness, syncope, weakness, light-headedness and headaches.       Objective:   Physical Exam  Constitutional: He is oriented to person, place, and time. He appears well-developed and well-nourished.  HENT:  Right Ear: External ear normal.  Left Ear: External ear normal.  Mouth/Throat: Oropharynx is clear and moist.  Eyes: Pupils are equal, round, and reactive to light.  Neck: Neck supple. No thyromegaly present.  Cardiovascular: Normal rate and regular rhythm.   Pulmonary/Chest: Effort normal and breath sounds normal. No respiratory distress. He has no wheezes. He has no rales.  Musculoskeletal: He exhibits no edema.  Neurological: He is alert and oriented to person, place, and time.       Assessment:     #1 type 2 diabetes. History of poor control and poor compliance.  P A1c today 7.4%  #2 hypertension. Very well controlled. He's had several recent fairly low readings though no consistent orthostatic symptoms  #3 dyslipidemia    Plan:     -Recheck labs today with A1c, lipid panel, hepatic panel, basic metabolic panel -Flu vaccine given -We discussed discontinuing Exforge and monitor blood pressure closely. We discussed our preference not to have him on combination with ACE inhibitor and ARB -If blood pressures consistently greater than 130/85 after discontinuing Exforge would add back low-dose amlodipine -Plan routine follow-up in 3 months -Discussed options for diabetes management. Patient  prefers not to go on insulin at this point. Reassess in 4 months.  Richard Post MD  Primary Care at [facility]

## 2016-06-17 NOTE — Progress Notes (Signed)
Pre visit review using our clinic review tool, if applicable. No additional management support is needed unless otherwise documented below in the visit note. 

## 2016-06-19 ENCOUNTER — Encounter: Payer: Self-pay | Admitting: Family Medicine

## 2016-06-20 ENCOUNTER — Other Ambulatory Visit: Payer: Self-pay

## 2016-06-20 MED ORDER — PIOGLITAZONE HCL-METFORMIN ER 15-1000 MG PO TB24: 1.0000 | ORAL_TABLET | Freq: Every day | ORAL | 1 refills | Status: DC

## 2016-07-23 LAB — HM DIABETES EYE EXAM

## 2016-07-25 ENCOUNTER — Encounter: Payer: Self-pay | Admitting: Family Medicine

## 2016-07-29 ENCOUNTER — Encounter: Payer: Self-pay | Admitting: Family Medicine

## 2016-08-03 ENCOUNTER — Other Ambulatory Visit: Payer: Self-pay | Admitting: Family Medicine

## 2016-08-18 ENCOUNTER — Other Ambulatory Visit: Payer: Self-pay | Admitting: Family Medicine

## 2016-09-05 ENCOUNTER — Other Ambulatory Visit: Payer: Self-pay | Admitting: Family Medicine

## 2016-09-12 ENCOUNTER — Other Ambulatory Visit: Payer: Self-pay | Admitting: Family Medicine

## 2016-10-17 ENCOUNTER — Encounter: Payer: Self-pay | Admitting: Family Medicine

## 2016-10-17 ENCOUNTER — Ambulatory Visit (INDEPENDENT_AMBULATORY_CARE_PROVIDER_SITE_OTHER): Payer: BLUE CROSS/BLUE SHIELD | Admitting: Family Medicine

## 2016-10-17 VITALS — BP 122/66 | HR 112 | Temp 98.0°F | Ht 71.0 in | Wt 243.0 lb

## 2016-10-17 DIAGNOSIS — E1165 Type 2 diabetes mellitus with hyperglycemia: Secondary | ICD-10-CM

## 2016-10-17 DIAGNOSIS — I10 Essential (primary) hypertension: Secondary | ICD-10-CM

## 2016-10-17 DIAGNOSIS — IMO0001 Reserved for inherently not codable concepts without codable children: Secondary | ICD-10-CM

## 2016-10-17 DIAGNOSIS — Z23 Encounter for immunization: Secondary | ICD-10-CM

## 2016-10-17 LAB — POCT GLYCOSYLATED HEMOGLOBIN (HGB A1C): Hemoglobin A1C: 7.3

## 2016-10-17 NOTE — Progress Notes (Signed)
Pre visit review using our clinic review tool, if applicable. No additional management support is needed unless otherwise documented below in the visit note. 

## 2016-10-17 NOTE — Progress Notes (Signed)
Subjective:     Patient ID: Richard Dalton, male   DOB: October 27, 1950, 66 y.o.   MRN: [id number]  HPI Patient seen for follow-up. He has history of obesity, type 2 diabetes, hyperlipidemia, hypertension. He is on multiple diabetic medications. He's lost 1 pound since last visit. He is currently taking Victoza and does notice some early satiety with meals. He thinks this is assisting his weight loss. He had sent an email with questions regarding a new GLP-1 medication which has not yet come to market which has been shown in trials to help with weight loss and A1c reduction superior to current available drugs.  He needs Pneumovax. Other immunizations up-to-date. No risk factors for hepatitis C.  Hypertension. We discontinued Exforge last visit and blood pressures remain stable. No recent chest pains. No dyspnea.  Past Medical History:  Diagnosis Date  . ALLERGIC RHINITIS 11/12/2006  . DIABETES MELLITUS, TYPE II 08/07/2006  . ECZEMA 11/08/2008  . ERECTILE DYSFUNCTION 11/12/2006  . Extrinsic asthma, unspecified 05/11/2008  . GENERALIZED VACCINIA AS COMP MEDICAL CARE NEC 09/09/2010  . HYPERLIPIDEMIA 08/07/2006  . HYPERTENSION 08/07/2006  . Neoplasm of uncertain behavior of skin 02/07/2009  . OSTEOARTHRITIS 08/07/2006  . PLANTAR FASCIITIS 11/12/2006   Past Surgical History:  Procedure Laterality Date  . KNEE ARTHROSCOPY     right  . TONSILLECTOMY AND ADENOIDECTOMY  1955  . WISDOM TOOTH EXTRACTION      reports that he quit smoking about 29 years ago. His smoking use included Cigarettes. He has a 40.00 pack-year smoking history. He has never used smokeless tobacco. He reports that he drinks about 1.2 oz of alcohol per week . He reports that he does not use drugs. family history includes Alzheimer's disease in his father; Diabetes in his brother, daughter, mother, and sister. Allergies  Allergen Reactions  . Tetracycline     REACTION: rash     Review of Systems  Constitutional: Negative for fatigue.   Eyes: Negative for visual disturbance.  Respiratory: Negative for cough, chest tightness and shortness of breath.   Cardiovascular: Negative for chest pain, palpitations and leg swelling.  Endocrine: Negative for polydipsia and polyuria.  Neurological: Negative for dizziness, syncope, weakness, light-headedness and headaches.       Objective:   Physical Exam  Constitutional: He is oriented to person, place, and time. He appears well-developed and well-nourished.  HENT:  Right Ear: External ear normal.  Left Ear: External ear normal.  Mouth/Throat: Oropharynx is clear and moist.  Eyes: Pupils are equal, round, and reactive to light.  Neck: Neck supple. No thyromegaly present.  Cardiovascular: Normal rate and regular rhythm.   Pulmonary/Chest: Effort normal and breath sounds normal. No respiratory distress. He has no wheezes. He has no rales.  Musculoskeletal: He exhibits no edema.  Neurological: He is alert and oriented to person, place, and time.       Assessment:     #1 Hypertension. Stable with recent discontinuation of Exforge  #2 type 2 diabetes. Repeat A1c today 7.3%    Plan:     -He is encouraged to continue with weight loss -No medication changes made today -Reassess A1c in 3 months. If not improving further that point consider change to longer acting GLP-1 medication -Pneumovax given  [name] MD [facility] at [facility]

## 2016-11-02 ENCOUNTER — Encounter: Payer: Self-pay | Admitting: Family Medicine

## 2016-11-04 ENCOUNTER — Telehealth: Payer: Self-pay

## 2016-11-04 NOTE — Telephone Encounter (Signed)
Received PA request that Victoza authorization was expired. PA submitted & is pending. Key: [id number]

## 2016-11-04 NOTE — Telephone Encounter (Signed)
Received form for more information from insurance company. Form filled out & faxed back.

## 2016-11-05 NOTE — Telephone Encounter (Signed)
PA approved, mychart message sent to patient.

## 2016-12-03 ENCOUNTER — Other Ambulatory Visit: Payer: Self-pay | Admitting: Family Medicine

## 2017-01-19 ENCOUNTER — Encounter: Payer: Self-pay | Admitting: Family Medicine

## 2017-01-19 ENCOUNTER — Ambulatory Visit (INDEPENDENT_AMBULATORY_CARE_PROVIDER_SITE_OTHER): Payer: BLUE CROSS/BLUE SHIELD | Admitting: Family Medicine

## 2017-01-19 VITALS — BP 130/64 | HR 110 | Temp 98.5°F | Wt 245.0 lb

## 2017-01-19 DIAGNOSIS — IMO0001 Reserved for inherently not codable concepts without codable children: Secondary | ICD-10-CM

## 2017-01-19 DIAGNOSIS — R000 Tachycardia, unspecified: Secondary | ICD-10-CM

## 2017-01-19 DIAGNOSIS — E119 Type 2 diabetes mellitus without complications: Secondary | ICD-10-CM

## 2017-01-19 DIAGNOSIS — E1165 Type 2 diabetes mellitus with hyperglycemia: Secondary | ICD-10-CM

## 2017-01-19 DIAGNOSIS — I10 Essential (primary) hypertension: Secondary | ICD-10-CM

## 2017-01-19 LAB — POCT GLYCOSYLATED HEMOGLOBIN (HGB A1C): Hemoglobin A1C: 7.1

## 2017-01-19 NOTE — Progress Notes (Signed)
Pre visit review using our clinic review tool, if applicable. No additional management support is needed unless otherwise documented below in the visit note. 

## 2017-01-19 NOTE — Progress Notes (Signed)
Subjective:     Patient ID: Richard Dalton, male   DOB: June 04, 1951, 66 y.o.   MRN: [id number]  HPI Patient seen for medical follow-up. Type 2 diabetes. Last hemoglobin A1c 7.3%. His weight is up 2 pounds from last visit. Inconsistent exercise. No polyuria or polydipsia. Compliant with medications. Denies any side effects.  Hypertension treated with lisinopril HCTZ. No headaches or dizziness. No chest pains. He has hyperlipidemia treated with Lipitor. No myalgias.  Past Medical History:  Diagnosis Date  . ALLERGIC RHINITIS 11/12/2006  . DIABETES MELLITUS, TYPE II 08/07/2006  . ECZEMA 11/08/2008  . ERECTILE DYSFUNCTION 11/12/2006  . Extrinsic asthma, unspecified 05/11/2008  . GENERALIZED VACCINIA AS COMP MEDICAL CARE NEC 09/09/2010  . HYPERLIPIDEMIA 08/07/2006  . HYPERTENSION 08/07/2006  . Neoplasm of uncertain behavior of skin 02/07/2009  . OSTEOARTHRITIS 08/07/2006  . PLANTAR FASCIITIS 11/12/2006   Past Surgical History:  Procedure Laterality Date  . KNEE ARTHROSCOPY     right  . TONSILLECTOMY AND ADENOIDECTOMY  1955  . WISDOM TOOTH EXTRACTION      reports that he quit smoking about 29 years ago. His smoking use included Cigarettes. He has a 40.00 pack-year smoking history. He has never used smokeless tobacco. He reports that he drinks about 1.2 oz of alcohol per week . He reports that he does not use drugs. family history includes Alzheimer's disease in his father; Diabetes in his brother, daughter, mother, and sister. Allergies  Allergen Reactions  . Tetracycline     REACTION: rash     Review of Systems  Constitutional: Negative for appetite change, fatigue and unexpected weight change.  Eyes: Negative for visual disturbance.  Respiratory: Negative for cough, chest tightness and shortness of breath.   Cardiovascular: Negative for chest pain, palpitations and leg swelling.  Endocrine: Negative for polydipsia and polyuria.  Neurological: Negative for dizziness, syncope, weakness,  light-headedness and headaches.       Objective:   Physical Exam  Constitutional: He is oriented to person, place, and time. He appears well-developed and well-nourished.  HENT:  Right Ear: External ear normal.  Left Ear: External ear normal.  Mouth/Throat: Oropharynx is clear and moist.  Eyes: Pupils are equal, round, and reactive to light.  Neck: Neck supple. No thyromegaly present.  Cardiovascular: Regular rhythm.   Slightly tachycardic with rate between 100 and 110 but regular  Pulmonary/Chest: Effort normal and breath sounds normal. No respiratory distress. He has no wheezes. He has no rales.  Musculoskeletal: He exhibits no edema.  Neurological: He is alert and oriented to person, place, and time.       Assessment:     #1 type 2 diabetes. A1c today 7.1%.  #2 hypertension stable at goal  #3 dyslipidemia on Lipitor  #4 mild tachycardia on exam today with no history of this. He has very regular rhythm. He denied any weight loss, diarrhea, or other symptoms to suggest likely hyperthyroidism    Plan:     -Minimize caffeine use and monitor pulse closely and be in touch if consistently greater than 100 -Continue current medications -Recommend weight loss. Suggested calorie tracking application -Routine follow-up in 6 months and sooner as needed  [name] MD [facility] at [facility]

## 2017-01-19 NOTE — Patient Instructions (Signed)
Continue with weight loss efforts Let's plan on 4 month follow up.

## 2017-01-28 ENCOUNTER — Other Ambulatory Visit: Payer: Self-pay | Admitting: Family Medicine

## 2017-05-02 ENCOUNTER — Other Ambulatory Visit: Payer: Self-pay | Admitting: Family Medicine

## 2017-05-11 ENCOUNTER — Other Ambulatory Visit: Payer: Self-pay | Admitting: Family Medicine

## 2017-05-20 ENCOUNTER — Encounter: Payer: Self-pay | Admitting: Family Medicine

## 2017-05-20 ENCOUNTER — Ambulatory Visit (INDEPENDENT_AMBULATORY_CARE_PROVIDER_SITE_OTHER): Payer: BLUE CROSS/BLUE SHIELD | Admitting: Family Medicine

## 2017-05-20 VITALS — BP 102/64 | HR 94 | Temp 98.2°F | Wt 243.1 lb

## 2017-05-20 DIAGNOSIS — I10 Essential (primary) hypertension: Secondary | ICD-10-CM | POA: Diagnosis not present

## 2017-05-20 DIAGNOSIS — E1165 Type 2 diabetes mellitus with hyperglycemia: Secondary | ICD-10-CM

## 2017-05-20 DIAGNOSIS — E785 Hyperlipidemia, unspecified: Secondary | ICD-10-CM | POA: Diagnosis not present

## 2017-05-20 DIAGNOSIS — IMO0001 Reserved for inherently not codable concepts without codable children: Secondary | ICD-10-CM

## 2017-05-20 LAB — POCT GLYCOSYLATED HEMOGLOBIN (HGB A1C): Hemoglobin A1C: 7.5

## 2017-05-20 NOTE — Progress Notes (Signed)
Subjective:     Patient ID: Richard Dalton, male   DOB: [birth year], 66 y.o.   MRN: [id number]  HPI Patient seen for follow-up type 2 diabetes  He has lost 2 pounds since last visit but no major changes in diet or activity levels. On multiple medications for diabetes and compliant with all. Denies any side effects from medications.  Hypertension treated with lisinopril HCTZ and stable. No dizziness. No chest pains.  Takes Lipitor for hyperlipidemia. Denies any myalgias.  Past Medical History:  Diagnosis Date  . ALLERGIC RHINITIS 11/12/2006  . DIABETES MELLITUS, TYPE II 08/07/2006  . ECZEMA 11/08/2008  . ERECTILE DYSFUNCTION 11/12/2006  . Extrinsic asthma, unspecified 05/11/2008  . GENERALIZED VACCINIA AS COMP MEDICAL CARE NEC 09/09/2010  . HYPERLIPIDEMIA 08/07/2006  . HYPERTENSION 08/07/2006  . Neoplasm of uncertain behavior of skin 02/07/2009  . OSTEOARTHRITIS 08/07/2006  . PLANTAR FASCIITIS 11/12/2006   Past Surgical History:  Procedure Laterality Date  . KNEE ARTHROSCOPY     right  . TONSILLECTOMY AND ADENOIDECTOMY  1955  . WISDOM TOOTH EXTRACTION      reports that he quit smoking about 30 years ago. His smoking use included Cigarettes. He has a 40.00 pack-year smoking history. He has never used smokeless tobacco. He reports that he drinks about 1.2 oz of alcohol per week . He reports that he does not use drugs. family history includes Alzheimer's disease in his father; Diabetes in his brother, daughter, mother, and sister. Allergies  Allergen Reactions  . Tetracycline     REACTION: rash     Review of Systems  Constitutional: Negative for activity change, appetite change and fever.  Respiratory: Negative for cough and shortness of breath.   Cardiovascular: Negative for chest pain and leg swelling.  Gastrointestinal: Negative for nausea.  Neurological: Negative for numbness.       Objective:   Physical Exam  Constitutional: He appears well-developed and well-nourished.  Neck: Neck  supple.  Cardiovascular: Normal rate and regular rhythm.   Pulmonary/Chest: Effort normal and breath sounds normal. No respiratory distress. He has no wheezes. He has no rales.  Musculoskeletal: He exhibits no edema.       Assessment:     #1 Type 2 diabetes. Suboptimal control with A1c 7.5%  #2 hypertension stable and at goal  #3 hyperlipidemia.    Plan:     -We discussed options. He has expressed some interest in looking at the newest GLP-1 medication-Semaglutide and he will check on coverage. -With strongly encouraged him to lose some weight. He prefers give this 3 more months and reassess at that point  [name] MD [facility] at [facility]

## 2017-05-20 NOTE — Patient Instructions (Signed)
Consider free app for tracking calorie intake such as "Myfitnesspal"

## 2017-05-26 ENCOUNTER — Encounter: Payer: Self-pay | Admitting: Family Medicine

## 2017-06-25 ENCOUNTER — Encounter: Payer: Self-pay | Admitting: Family Medicine

## 2017-07-21 LAB — HM DIABETES EYE EXAM

## 2017-07-29 ENCOUNTER — Encounter: Payer: Self-pay | Admitting: Family Medicine

## 2017-08-19 ENCOUNTER — Encounter: Payer: Self-pay | Admitting: Family Medicine

## 2017-08-19 ENCOUNTER — Ambulatory Visit: Payer: BLUE CROSS/BLUE SHIELD | Admitting: Family Medicine

## 2017-08-19 VITALS — BP 120/64 | HR 90 | Temp 98.4°F

## 2017-08-19 DIAGNOSIS — E11649 Type 2 diabetes mellitus with hypoglycemia without coma: Secondary | ICD-10-CM | POA: Diagnosis not present

## 2017-08-19 LAB — POCT GLYCOSYLATED HEMOGLOBIN (HGB A1C): HEMOGLOBIN A1C: 7.5

## 2017-08-19 NOTE — Patient Instructions (Signed)
Set up complete physical.   

## 2017-08-19 NOTE — Progress Notes (Signed)
Subjective:     Patient ID: Richard Dalton, male   DOB: 1951/01/14, 66 y.o.   MRN: [id number]  HPI Patient her for follow-up type 2 diabetes  He takes several medications as listed. He has lost a few pounds and has been more active physically since last visit. He has been reluctant to add more medication. He denies any side effects from medications. No hypoglycemia. He is overdue for lipids. No recent chest pains or other complaints.  Past Medical History:  Diagnosis Date  . ALLERGIC RHINITIS 11/12/2006  . DIABETES MELLITUS, TYPE II 08/07/2006  . ECZEMA 11/08/2008  . ERECTILE DYSFUNCTION 11/12/2006  . Extrinsic asthma, unspecified 05/11/2008  . GENERALIZED VACCINIA AS COMP MEDICAL CARE NEC 09/09/2010  . HYPERLIPIDEMIA 08/07/2006  . HYPERTENSION 08/07/2006  . Neoplasm of uncertain behavior of skin 02/07/2009  . OSTEOARTHRITIS 08/07/2006  . PLANTAR FASCIITIS 11/12/2006   Past Surgical History:  Procedure Laterality Date  . KNEE ARTHROSCOPY     right  . TONSILLECTOMY AND ADENOIDECTOMY  1955  . WISDOM TOOTH EXTRACTION      reports that he quit smoking about 30 years ago. His smoking use included cigarettes. He has a 40.00 pack-year smoking history. he has never used smokeless tobacco. He reports that he drinks about 1.2 oz of alcohol per week. He reports that he does not use drugs. family history includes Alzheimer's disease in his father; Diabetes in his brother, daughter, mother, and sister. Allergies  Allergen Reactions  . Tetracycline     REACTION: rash     Review of Systems  Constitutional: Negative for fatigue.  Eyes: Negative for visual disturbance.  Respiratory: Negative for cough, chest tightness and shortness of breath.   Cardiovascular: Negative for chest pain, palpitations and leg swelling.  Neurological: Negative for dizziness, syncope, weakness, light-headedness and headaches.       Objective:   Physical Exam  Constitutional: He is oriented to person, place, and time. He  appears well-developed and well-nourished.  HENT:  Right Ear: External ear normal.  Left Ear: External ear normal.  Mouth/Throat: Oropharynx is clear and moist.  Eyes: Pupils are equal, round, and reactive to light.  Neck: Neck supple. No thyromegaly present.  Cardiovascular: Normal rate and regular rhythm.  Pulmonary/Chest: Effort normal and breath sounds normal. No respiratory distress. He has no wheezes. He has no rales.  Musculoskeletal: He exhibits no edema.  Neurological: He is alert and oriented to person, place, and time.       Assessment:     Type 2 diabetes. A1c today 7.5% which is unchanged from last visit    Plan:     -Discuss options. Patient is reluctant to add more medication this point. -We'll plan three-month follow-up and schedule physical for then and hopefully with additional weight loss will see his A1c moving down  [name] MD [facility] at [facility]

## 2017-09-03 ENCOUNTER — Other Ambulatory Visit: Payer: Self-pay | Admitting: Family Medicine

## 2017-09-08 ENCOUNTER — Encounter: Payer: Self-pay | Admitting: Family Medicine

## 2017-09-11 ENCOUNTER — Encounter: Payer: Self-pay | Admitting: Family Medicine

## 2017-09-15 MED ORDER — PIOGLITAZONE HCL 15 MG PO TABS: 15.0000 mg | ORAL_TABLET | Freq: Every day | ORAL | 0 refills | Status: DC

## 2017-09-15 MED ORDER — METFORMIN HCL 1000 MG PO TABS: 1000.0000 mg | ORAL_TABLET | Freq: Every day | ORAL | 0 refills | Status: DC

## 2017-10-02 ENCOUNTER — Other Ambulatory Visit: Payer: Self-pay | Admitting: *Deleted

## 2017-10-02 ENCOUNTER — Encounter: Payer: Self-pay | Admitting: Family Medicine

## 2017-10-02 MED ORDER — PIOGLITAZONE HCL 15 MG PO TABS: 15.0000 mg | ORAL_TABLET | Freq: Every day | ORAL | 3 refills | Status: DC

## 2017-10-02 MED ORDER — METFORMIN HCL 1000 MG PO TABS: 1000.0000 mg | ORAL_TABLET | Freq: Every day | ORAL | 3 refills | Status: DC

## 2017-10-02 NOTE — Telephone Encounter (Signed)
Rx done and pt informed via Mychart message. 

## 2017-11-19 ENCOUNTER — Encounter: Payer: Self-pay | Admitting: Family Medicine

## 2017-11-20 ENCOUNTER — Ambulatory Visit (INDEPENDENT_AMBULATORY_CARE_PROVIDER_SITE_OTHER): Payer: BLUE CROSS/BLUE SHIELD | Admitting: Family Medicine

## 2017-11-20 ENCOUNTER — Encounter: Payer: Self-pay | Admitting: Family Medicine

## 2017-11-20 VITALS — BP 120/64 | HR 88 | Temp 98.1°F | Ht 68.5 in | Wt 238.1 lb

## 2017-11-20 DIAGNOSIS — Z125 Encounter for screening for malignant neoplasm of prostate: Secondary | ICD-10-CM

## 2017-11-20 DIAGNOSIS — Z0000 Encounter for general adult medical examination without abnormal findings: Secondary | ICD-10-CM | POA: Diagnosis not present

## 2017-11-20 LAB — BASIC METABOLIC PANEL
BUN: 30 mg/dL — AB (ref 6–23)
CHLORIDE: 102 meq/L (ref 96–112)
CO2: 29 meq/L (ref 19–32)
Calcium: 8.9 mg/dL (ref 8.4–10.5)
Creatinine, Ser: 1.47 mg/dL (ref 0.40–1.50)
GFR: 50.62 mL/min — AB (ref >60.00)
GLUCOSE: 119 mg/dL — AB (ref 70–99)
Potassium: 4.7 mEq/L (ref 3.5–5.1)
Sodium: 139 mEq/L (ref 135–145)

## 2017-11-20 LAB — CBC WITH DIFFERENTIAL/PLATELET
BASOS ABS: 0.1 10*3/uL (ref 0.0–0.1)
Basophils Relative: 1.3 % (ref 0.0–3.0)
EOS ABS: 0.2 10*3/uL (ref 0.0–0.7)
Eosinophils Relative: 4.3 % (ref 0.0–5.0)
HCT: 45.8 % (ref 39.0–52.0)
HEMOGLOBIN: 15.2 g/dL (ref 13.0–17.0)
Lymphocytes Relative: 29.2 % (ref 12.0–46.0)
Lymphs Abs: 1.6 10*3/uL (ref 0.7–4.0)
MCHC: 33.1 g/dL (ref 30.0–36.0)
MCV: 94.2 fl (ref 78.0–100.0)
MONO ABS: 0.5 10*3/uL (ref 0.1–1.0)
Monocytes Relative: 9.4 % (ref 3.0–12.0)
Neutro Abs: 3.1 10*3/uL (ref 1.4–7.7)
Neutrophils Relative %: 55.8 % (ref 43.0–77.0)
Platelets: 231 10*3/uL (ref 150.0–400.0)
RBC: 4.86 Mil/uL (ref 4.22–5.81)
RDW: 14.2 % (ref 11.5–15.5)
WBC: 5.6 10*3/uL (ref 4.0–10.5)

## 2017-11-20 LAB — LIPID PANEL
CHOL/HDL RATIO: 3
Cholesterol: 152 mg/dL (ref 0–200)
HDL: 46.8 mg/dL (ref >39.00)
LDL CALC: 81 mg/dL (ref 0–99)
NONHDL: 105.07
Triglycerides: 118 mg/dL (ref 0.0–149.0)
VLDL: 23.6 mg/dL (ref 0.0–40.0)

## 2017-11-20 LAB — HEPATIC FUNCTION PANEL
ALK PHOS: 75 U/L (ref 39–117)
ALT: 23 U/L (ref 0–53)
AST: 17 U/L (ref 0–37)
Albumin: 4.2 g/dL (ref 3.5–5.2)
BILIRUBIN TOTAL: 0.6 mg/dL (ref 0.2–1.2)
Bilirubin, Direct: 0.1 mg/dL (ref 0.0–0.3)
Total Protein: 6.8 g/dL (ref 6.0–8.3)

## 2017-11-20 LAB — PSA: PSA: 1.83 ng/mL (ref 0.10–4.00)

## 2017-11-20 LAB — HEMOGLOBIN A1C: Hgb A1c MFr Bld: 7.8 % — ABNORMAL HIGH (ref 4.6–6.5)

## 2017-11-20 LAB — TSH: TSH: 1.6 u[IU]/mL (ref 0.35–4.50)

## 2017-11-20 NOTE — Patient Instructions (Signed)
Consider one time abdominal aortic aneurysm screen - would check on insurance coverage  Consider new shingles vaccine-Shingrix by next year.

## 2017-11-20 NOTE — Progress Notes (Signed)
Subjective:     Patient ID: Richard Dalton, male   DOB: [birth year], 67 y.o.   MRN: [id number]  HPI Patient seen for physical exam. His chronic problems include history of obesity, type 2 diabetes, dyslipidemia, hypertension. No consistent exercise. Not monitoring blood sugars regularly. He has had some occasional slow stream recently. No burning with urination. No recent PSA testing. No history of hepatitis C testing. Low risk other than age. No history of abdominal aneurysm screening. He quit smoking 1988.  Will need repeat colonoscopy later this year. No history of new shingles vaccine but did have previous shingles vaccine a little over 3 years ago.  Past Medical History:  Diagnosis Date  . ALLERGIC RHINITIS 11/12/2006  . DIABETES MELLITUS, TYPE II 08/07/2006  . ECZEMA 11/08/2008  . ERECTILE DYSFUNCTION 11/12/2006  . Extrinsic asthma, unspecified 05/11/2008  . GENERALIZED VACCINIA AS COMP MEDICAL CARE NEC 09/09/2010  . HYPERLIPIDEMIA 08/07/2006  . HYPERTENSION 08/07/2006  . Neoplasm of uncertain behavior of skin 02/07/2009  . OSTEOARTHRITIS 08/07/2006  . PLANTAR FASCIITIS 11/12/2006   Past Surgical History:  Procedure Laterality Date  . KNEE ARTHROSCOPY     right  . TONSILLECTOMY AND ADENOIDECTOMY  1955  . WISDOM TOOTH EXTRACTION      reports that he quit smoking about 30 years ago. His smoking use included cigarettes. He has a 40.00 pack-year smoking history. he has never used smokeless tobacco. He reports that he drinks about 1.2 oz of alcohol per week. He reports that he does not use drugs. family history includes Alzheimer's disease in his father; Diabetes in his brother, daughter, mother, and sister. Allergies  Allergen Reactions  . Tetracycline     REACTION: rash     Review of Systems  Constitutional: Negative for activity change, appetite change, fatigue and fever.  HENT: Negative for congestion, ear pain and trouble swallowing.   Eyes: Negative for pain and visual disturbance.   Respiratory: Negative for cough, shortness of breath and wheezing.   Cardiovascular: Negative for chest pain and palpitations.  Gastrointestinal: Negative for abdominal distention, abdominal pain, blood in stool, constipation, diarrhea, nausea, rectal pain and vomiting.  Genitourinary: Negative for dysuria, hematuria and testicular pain.  Musculoskeletal: Negative for arthralgias and joint swelling.  Skin: Negative for rash.  Neurological: Negative for dizziness, syncope and headaches.  Hematological: Negative for adenopathy.  Psychiatric/Behavioral: Negative for confusion and dysphoric mood.       Objective:   Physical Exam  Constitutional: He is oriented to person, place, and time. He appears well-developed and well-nourished. No distress.  HENT:  Head: Normocephalic and atraumatic.  Right Ear: External ear normal.  Left Ear: External ear normal.  Mouth/Throat: Oropharynx is clear and moist.  Eyes: Conjunctivae and EOM are normal. Pupils are equal, round, and reactive to light.  Neck: Normal range of motion. Neck supple. No thyromegaly present.  Cardiovascular: Normal rate, regular rhythm and normal heart sounds.  No murmur heard. Pulmonary/Chest: No respiratory distress. He has no wheezes. He has no rales.  Abdominal: Soft. Bowel sounds are normal. He exhibits no distension. There is no tenderness. There is no rebound and no guarding.  Umbilical hernia which is soft and nontender. No other masses noted  Musculoskeletal: He exhibits no edema.  Lymphadenopathy:    He has no cervical adenopathy.  Neurological: He is alert and oriented to person, place, and time. He displays normal reflexes. No cranial nerve deficit.  Skin: No rash noted.  Psychiatric: He has a normal mood and affect.  Assessment:     Physical exam. Several health maintenance issues addressed as below    Plan:     -Obtain lab work including hepatitis C antibody and PSA after discussion of risk and  benefits -Discussed abdominal aortic aneurysm screening. He will check for insurance coverage first -Discussed new shingles vaccine he will consider by next year -Repeat colonoscopy later this year -Recommend losing some weight. We have discussed this in the past. His weight is down a few pounds from last fall  [name] MD [facility] at [facility]

## 2017-11-21 LAB — HEPATITIS C ANTIBODY
HEP C AB: NONREACTIVE
SIGNAL TO CUT-OFF: 0.01 (ref <1.00)

## 2017-11-23 ENCOUNTER — Other Ambulatory Visit: Payer: Self-pay | Admitting: Family Medicine

## 2017-11-23 DIAGNOSIS — R972 Elevated prostate specific antigen [PSA]: Secondary | ICD-10-CM

## 2017-12-23 ENCOUNTER — Other Ambulatory Visit: Payer: Self-pay | Admitting: Family Medicine

## 2018-01-06 DIAGNOSIS — J300 Vasomotor rhinitis: Secondary | ICD-10-CM | POA: Diagnosis not present

## 2018-01-06 DIAGNOSIS — J4530 Mild persistent asthma, uncomplicated: Secondary | ICD-10-CM | POA: Diagnosis not present

## 2018-02-17 ENCOUNTER — Encounter: Payer: Self-pay | Admitting: Family Medicine

## 2018-02-17 ENCOUNTER — Ambulatory Visit: Payer: 59 | Admitting: Family Medicine

## 2018-02-17 VITALS — BP 108/60 | HR 84 | Temp 98.3°F | Wt 238.4 lb

## 2018-02-17 DIAGNOSIS — I10 Essential (primary) hypertension: Secondary | ICD-10-CM

## 2018-02-17 DIAGNOSIS — R972 Elevated prostate specific antigen [PSA]: Secondary | ICD-10-CM

## 2018-02-17 DIAGNOSIS — E11649 Type 2 diabetes mellitus with hypoglycemia without coma: Secondary | ICD-10-CM | POA: Diagnosis not present

## 2018-02-17 DIAGNOSIS — Z136 Encounter for screening for cardiovascular disorders: Secondary | ICD-10-CM | POA: Diagnosis not present

## 2018-02-17 DIAGNOSIS — E785 Hyperlipidemia, unspecified: Secondary | ICD-10-CM

## 2018-02-17 LAB — POCT GLYCOSYLATED HEMOGLOBIN (HGB A1C): HEMOGLOBIN A1C: 7.2

## 2018-02-17 NOTE — Progress Notes (Signed)
  Subjective:     Patient ID: Richard Dalton, male   DOB: [birth year], 67 y.o.   MRN: [id number]  HPI Patient seen for follow-up regarding type 2 diabetes. Last A1c 7.8%. He is walking more and A1c down to 7.2% today. Also using some type of cinnamon supplement. Otherwise no change in medications. Generally feels well.  Recent PSA 1.83 and this was up from 0.83 year ago. We recommended a six-month follow-up. Denies any slow stream  Patient has questions regarding abdominal aortic aneurysm screening. Ex-smoker. No abdominal pain. He states he checked with his insurance and they state he would have coverage for preventative screen  Questions regarding aspirin use. No history of hemorrhagic strokes in the family. 10 year risk of CAD 22.2%  Past Medical History:  Diagnosis Date  . ALLERGIC RHINITIS 11/12/2006  . DIABETES MELLITUS, TYPE II 08/07/2006  . ECZEMA 11/08/2008  . ERECTILE DYSFUNCTION 11/12/2006  . Extrinsic asthma, unspecified 05/11/2008  . GENERALIZED VACCINIA AS COMP MEDICAL CARE NEC 09/09/2010  . HYPERLIPIDEMIA 08/07/2006  . HYPERTENSION 08/07/2006  . Neoplasm of uncertain behavior of skin 02/07/2009  . OSTEOARTHRITIS 08/07/2006  . PLANTAR FASCIITIS 11/12/2006   Past Surgical History:  Procedure Laterality Date  . KNEE ARTHROSCOPY     right  . TONSILLECTOMY AND ADENOIDECTOMY  1955  . WISDOM TOOTH EXTRACTION      reports that he quit smoking about 30 years ago. His smoking use included cigarettes. He has a 40.00 pack-year smoking history. He has never used smokeless tobacco. He reports that he drinks about 1.2 oz of alcohol per week. He reports that he does not use drugs. family history includes Alzheimer's disease in his father; Diabetes in his brother, daughter, mother, and sister. Allergies  Allergen Reactions  . Tetracycline     REACTION: rash     Review of Systems  Constitutional: Negative for fatigue.  Eyes: Negative for visual disturbance.  Respiratory: Negative for cough,  chest tightness and shortness of breath.   Cardiovascular: Negative for chest pain, palpitations and leg swelling.  Endocrine: Negative for polydipsia and polyuria.  Neurological: Negative for dizziness, syncope, weakness, light-headedness and headaches.       Objective:   Physical Exam  Constitutional: He appears well-developed and well-nourished.  Cardiovascular: Normal rate and regular rhythm.  Pulmonary/Chest: Effort normal and breath sounds normal. He has no wheezes. He has no rales.  Musculoskeletal: He exhibits no edema.       Assessment:     #1 type 2 diabetes improved with A1c today 7.2%  #2 hypertension stable and at goal  #3 history of hyperlipidemia well controlled by recent labs  #4 increased PSA velocity with increase from 0.83-1.83 recently      Plan:     -Set up preventative abdominal aortic aneurysm screen -Continue weight control efforts and increased exercise. His A1c is improving -Recheck in 3 months. Check further labs then with basic metabolic panel, K8L, and repeat PSA  [name] MD [facility] at [facility]

## 2018-02-17 NOTE — Patient Instructions (Signed)
We will set up abdominal aortic aneurysm screen.

## 2018-03-04 ENCOUNTER — Ambulatory Visit (HOSPITAL_COMMUNITY)
Admission: RE | Admit: 2018-03-04 | Discharge: 2018-03-04 | Disposition: A | Discharge status: Home / Self Care | Payer: 59 | Source: Ambulatory Visit | Attending: Cardiology | Admitting: Cardiology

## 2018-03-04 DIAGNOSIS — E669 Obesity, unspecified: Secondary | ICD-10-CM | POA: Diagnosis not present

## 2018-03-04 DIAGNOSIS — Z136 Encounter for screening for cardiovascular disorders: Secondary | ICD-10-CM | POA: Insufficient documentation

## 2018-03-04 DIAGNOSIS — I10 Essential (primary) hypertension: Secondary | ICD-10-CM | POA: Insufficient documentation

## 2018-03-04 DIAGNOSIS — I708 Atherosclerosis of other arteries: Secondary | ICD-10-CM | POA: Diagnosis not present

## 2018-03-04 DIAGNOSIS — I700 Atherosclerosis of aorta: Secondary | ICD-10-CM | POA: Insufficient documentation

## 2018-03-04 DIAGNOSIS — Z87891 Personal history of nicotine dependence: Secondary | ICD-10-CM | POA: Diagnosis not present

## 2018-03-04 DIAGNOSIS — E119 Type 2 diabetes mellitus without complications: Secondary | ICD-10-CM | POA: Diagnosis not present

## 2018-04-16 ENCOUNTER — Encounter: Payer: Self-pay | Admitting: Family Medicine

## 2018-05-21 ENCOUNTER — Ambulatory Visit: Payer: 59 | Admitting: Family Medicine

## 2018-05-21 ENCOUNTER — Encounter: Payer: Self-pay | Admitting: Family Medicine

## 2018-05-21 VITALS — BP 110/54 | HR 97 | Temp 98.4°F | Wt 236.3 lb

## 2018-05-21 DIAGNOSIS — E11649 Type 2 diabetes mellitus with hypoglycemia without coma: Secondary | ICD-10-CM

## 2018-05-21 DIAGNOSIS — I10 Essential (primary) hypertension: Secondary | ICD-10-CM

## 2018-05-21 LAB — POCT GLYCOSYLATED HEMOGLOBIN (HGB A1C): HEMOGLOBIN A1C: 7.3 % — AB (ref 4.0–5.6)

## 2018-05-21 NOTE — Progress Notes (Signed)
  Subjective:     Patient ID: Richard Dalton, male   DOB: Nov 23, 1950, 67 y.o.   MRN: [id number]  HPI Here for follow-up type 2 diabetes. Last A1c 7.2%. He has been tracking his steps and walking more. He has accomplished 2 pounds of weight loss since last visit. Blood pressure is improved. Medications reviewed with no recent changes. No recent chest pains.  Past Medical History:  Diagnosis Date  . ALLERGIC RHINITIS 11/12/2006  . DIABETES MELLITUS, TYPE II 08/07/2006  . ECZEMA 11/08/2008  . ERECTILE DYSFUNCTION 11/12/2006  . Extrinsic asthma, unspecified 05/11/2008  . GENERALIZED VACCINIA AS COMP MEDICAL CARE NEC 09/09/2010  . HYPERLIPIDEMIA 08/07/2006  . HYPERTENSION 08/07/2006  . Neoplasm of uncertain behavior of skin 02/07/2009  . OSTEOARTHRITIS 08/07/2006  . PLANTAR FASCIITIS 11/12/2006   Past Surgical History:  Procedure Laterality Date  . KNEE ARTHROSCOPY     right  . TONSILLECTOMY AND ADENOIDECTOMY  1955  . WISDOM TOOTH EXTRACTION      reports that he quit smoking about 31 years ago. His smoking use included cigarettes. He has a 40.00 pack-year smoking history. He has never used smokeless tobacco. He reports that he drinks about 2.0 standard drinks of alcohol per week. He reports that he does not use drugs. family history includes Alzheimer's disease in his father; Diabetes in his brother, daughter, mother, and sister. Allergies  Allergen Reactions  . Tetracycline     REACTION: rash     Review of Systems  Constitutional: Negative for fatigue.  Eyes: Negative for visual disturbance.  Respiratory: Negative for cough, chest tightness and shortness of breath.   Cardiovascular: Negative for chest pain, palpitations and leg swelling.  Endocrine: Negative for polydipsia and polyuria.  Neurological: Negative for dizziness, syncope, weakness, light-headedness and headaches.       Objective:   Physical Exam  Constitutional: He appears well-developed and well-nourished.  Cardiovascular:  Normal rate and regular rhythm.  Pulmonary/Chest: Effort normal and breath sounds normal. He has no wheezes. He has no rales.       Assessment:     Type 2 diabetes stable with A1c today 7.3%    Plan:     -Continue weight loss efforts -Patient plans to get flu vaccine later this fall -Continue with yearly eye exams -Routine follow-up in 6 months for physical  [name] MD [facility] at [facility]

## 2018-06-26 ENCOUNTER — Other Ambulatory Visit: Payer: Self-pay | Admitting: Family Medicine

## 2018-07-12 DIAGNOSIS — E119 Type 2 diabetes mellitus without complications: Secondary | ICD-10-CM | POA: Diagnosis not present

## 2018-07-12 LAB — HM DIABETES EYE EXAM

## 2018-08-02 ENCOUNTER — Other Ambulatory Visit: Payer: Self-pay | Admitting: Family Medicine

## 2018-09-26 ENCOUNTER — Encounter: Payer: Self-pay | Admitting: Internal Medicine

## 2018-10-18 ENCOUNTER — Encounter: Payer: Self-pay | Admitting: Internal Medicine

## 2018-11-04 ENCOUNTER — Ambulatory Visit (AMBULATORY_SURGERY_CENTER): Payer: Self-pay | Admitting: *Deleted

## 2018-11-04 ENCOUNTER — Other Ambulatory Visit: Payer: Self-pay

## 2018-11-04 ENCOUNTER — Encounter: Payer: Self-pay | Admitting: Internal Medicine

## 2018-11-04 VITALS — Ht 69.5 in | Wt 244.0 lb

## 2018-11-04 DIAGNOSIS — Z86010 Personal history of colonic polyps: Secondary | ICD-10-CM

## 2018-11-04 MED ORDER — SUPREP BOWEL PREP KIT 17.5-3.13-1.6 GM/177ML PO SOLN: 1.0000 | Freq: Once | ORAL | 0 refills | Status: AC

## 2018-11-04 NOTE — Progress Notes (Signed)
Patient denies any allergies to egg or soy products. Patient denies complications with anesthesia/sedation.  Patient denies oxygen use at home and denies diet medications. Patient denies information on colonoscopy. 

## 2018-11-18 ENCOUNTER — Ambulatory Visit (AMBULATORY_SURGERY_CENTER): Payer: 59 | Admitting: Internal Medicine

## 2018-11-18 ENCOUNTER — Encounter: Payer: Self-pay | Admitting: Internal Medicine

## 2018-11-18 VITALS — BP 126/66 | HR 89 | Temp 97.7°F | Resp 10 | Ht 69.0 in | Wt 244.0 lb

## 2018-11-18 DIAGNOSIS — K219 Gastro-esophageal reflux disease without esophagitis: Secondary | ICD-10-CM | POA: Diagnosis not present

## 2018-11-18 DIAGNOSIS — Z86010 Personal history of colonic polyps: Secondary | ICD-10-CM | POA: Diagnosis not present

## 2018-11-18 DIAGNOSIS — E119 Type 2 diabetes mellitus without complications: Secondary | ICD-10-CM | POA: Diagnosis not present

## 2018-11-18 DIAGNOSIS — I10 Essential (primary) hypertension: Secondary | ICD-10-CM | POA: Diagnosis not present

## 2018-11-18 DIAGNOSIS — D122 Benign neoplasm of ascending colon: Secondary | ICD-10-CM

## 2018-11-18 DIAGNOSIS — D123 Benign neoplasm of transverse colon: Secondary | ICD-10-CM

## 2018-11-18 MED ORDER — SODIUM CHLORIDE 0.9 % IV SOLN: 500.0000 mL | Freq: Once | INTRAVENOUS | Status: DC

## 2018-11-18 NOTE — Patient Instructions (Signed)
Handouts:  Polyps, Hemorrhoids, and Diverticula  YOU HAD AN ENDOSCOPIC PROCEDURE TODAY AT [facility]:   Refer to the procedure report that was given to you for any specific questions about what was found during the examination.  If the procedure report does not answer your questions, please call your gastroenterologist to clarify.  If you requested that your care partner not be given the details of your procedure findings, then the procedure report has been included in a sealed envelope for you to review at your convenience later.  YOU SHOULD EXPECT: Some feelings of bloating in the abdomen. Passage of more gas than usual.  Walking can help get rid of the air that was put into your GI tract during the procedure and reduce the bloating. If you had a lower endoscopy (such as a colonoscopy or flexible sigmoidoscopy) you may notice spotting of blood in your stool or on the toilet paper. If you underwent a bowel prep for your procedure, you may not have a normal bowel movement for a few days.  Please Note:  You might notice some irritation and congestion in your nose or some drainage.  This is from the oxygen used during your procedure.  There is no need for concern and it should clear up in a day or so.  SYMPTOMS TO REPORT IMMEDIATELY:   Following lower endoscopy (colonoscopy or flexible sigmoidoscopy):  Excessive amounts of blood in the stool  Significant tenderness or worsening of abdominal pains  Swelling of the abdomen that is new, acute  Fever of 100F or higher  For urgent or emergent issues, a gastroenterologist can be reached at any hour by calling [phone].   DIET:  We do recommend a small meal at first, but then you may proceed to your regular diet.  Drink plenty of fluids but you should avoid alcoholic beverages for 24 hours.  ACTIVITY:  You should plan to take it easy for the rest of today and you should NOT DRIVE or use heavy machinery until tomorrow (because of  the sedation medicines used during the test).    FOLLOW UP: Our staff will call the number listed on your records the next business day following your procedure to check on you and address any questions or concerns that you may have regarding the information given to you following your procedure. If we do not reach you, we will leave a message.  However, if you are feeling well and you are not experiencing any problems, there is no need to return our call.  We will assume that you have returned to your regular daily activities without incident.  If any biopsies were taken you will be contacted by phone or by letter within the next 1-3 weeks.  Please call us at [phone] if you have not heard about the biopsies in 3 weeks.    SIGNATURES/CONFIDENTIALITY: You and/or your care partner have signed paperwork which will be entered into your electronic medical record.  These signatures attest to the fact that that the information above on your After Visit Summary has been reviewed and is understood.  Full responsibility of the confidentiality of this discharge information lies with you and/or your care-partner.

## 2018-11-18 NOTE — Op Note (Signed)
[facility] Patient Name: Daquane Aguilar Procedure Date: 11/18/2018 9:03 AM MRN: [id number] Endoscopist: Docia Chuck. Henrene Pastor , MD Age: 68 Referring MD:  Date of Birth: Mar 19, 1951 Gender: Male Account #: [id number] Procedure:                Colonoscopy with cold snare polypectomy x 5 Indications:              High risk colon cancer surveillance: Personal                            history of non-advanced adenoma, High risk colon                            cancer surveillance: Personal history of sessile                            serrated colon polyp (10 mm or greater in size).                            Index exam [facility] remotely. Last                            examination here November 2016 with large sessile                            serrated polyp and tubular adenoma Medicines:                Monitored Anesthesia Care Procedure:                Pre-Anesthesia Assessment:                           - Prior to the procedure, a History and Physical                            was performed, and patient medications and                            allergies were reviewed. The patient's tolerance of                            previous anesthesia was also reviewed. The risks                            and benefits of the procedure and the sedation                            options and risks were discussed with the patient.                            All questions were answered, and informed consent                            was obtained. Prior Anticoagulants: The patient has  taken no previous anticoagulant or antiplatelet                            agents. ASA Grade Assessment: II - A patient with                            mild systemic disease. After reviewing the risks                            and benefits, the patient was deemed in                            satisfactory condition to undergo the procedure.                           After  obtaining informed consent, the colonoscope                            was passed under direct vision. Throughout the                            procedure, the patient's blood pressure, pulse, and                            oxygen saturations were monitored continuously. The                            Colonoscope was introduced through the anus and                            advanced to the the cecum, identified by                            appendiceal orifice and ileocecal valve. The                            ileocecal valve, appendiceal orifice, and rectum                            were photographed. The quality of the bowel                            preparation was good. The colonoscopy was performed                            without difficulty. The patient tolerated the                            procedure well. The bowel preparation used was                            SUPREP. Scope In: 9:12:32 AM Scope Out: 9:29:43 AM Scope Withdrawal Time: 0 hours 14 minutes 44 seconds  Total Procedure Duration: 0 hours 17 minutes 11  seconds  Findings:                 Five polyps were found in the transverse colon and                            ascending colon. The polyps were 2 to 4 mm in size.                            These polyps were removed with a cold snare.                            Resection and retrieval were complete.                           Multiple small and large-mouthed diverticula were                            found in the entire colon.                           Internal hemorrhoids were found during retroflexion.                           The exam was otherwise without abnormality on                            direct and retroflexion views. Complications:            No immediate complications. Estimated blood loss:                            None. Estimated Blood Loss:     Estimated blood loss: none. Impression:               - Five 2 to 4 mm polyps in the transverse  colon and                            in the ascending colon, removed with a cold snare.                            Resected and retrieved.                           - Diverticulosis in the entire examined colon.                           - Internal hemorrhoids.                           - The examination was otherwise normal on direct                            and retroflexion views. Recommendation:           - Repeat colonoscopy in 3 - 5 years for  surveillance.                           - Patient has a contact number available for                            emergencies. The signs and symptoms of potential                            delayed complications were discussed with the                            patient. Return to normal activities tomorrow.                            Written discharge instructions were provided to the                            patient.                           - Resume previous diet.                           - Continue present medications.                           - Await pathology results. Docia Chuck. Henrene Pastor, MD 11/18/2018 9:34:22 AM This report has been signed electronically.

## 2018-11-18 NOTE — Progress Notes (Signed)
Report to PACU, RN, vss, BBS= Clear.  

## 2018-11-18 NOTE — Progress Notes (Signed)
Pt's states no medical or surgical changes since previsit or office visit. 

## 2018-11-19 ENCOUNTER — Telehealth: Payer: Self-pay | Admitting: *Deleted

## 2018-11-19 NOTE — Telephone Encounter (Signed)
  Follow up Call-  Call back number 11/18/2018  Post procedure Call Back phone  # [phone]  Permission to leave phone message Yes  Some recent data might be hidden     Patient questions:  Do you have a fever, pain , or abdominal swelling? No. Pain Score  0 *  Have you tolerated food without any problems? Yes.    Have you been able to return to your normal activities? Yes.    Do you have any questions about your discharge instructions: Diet   No. Medications  No. Follow up visit  No.  Do you have questions or concerns about your Care? No.  Actions: * If pain score is 4 or above: No action needed, pain <4.

## 2018-11-22 ENCOUNTER — Encounter: Payer: Self-pay | Admitting: Family Medicine

## 2018-11-22 ENCOUNTER — Other Ambulatory Visit: Payer: Self-pay

## 2018-11-22 ENCOUNTER — Ambulatory Visit (INDEPENDENT_AMBULATORY_CARE_PROVIDER_SITE_OTHER): Payer: 59 | Admitting: Family Medicine

## 2018-11-22 VITALS — BP 118/68 | HR 96 | Temp 98.1°F | Ht 68.75 in | Wt 236.3 lb

## 2018-11-22 DIAGNOSIS — E11649 Type 2 diabetes mellitus with hypoglycemia without coma: Secondary | ICD-10-CM

## 2018-11-22 DIAGNOSIS — R972 Elevated prostate specific antigen [PSA]: Secondary | ICD-10-CM | POA: Diagnosis not present

## 2018-11-22 DIAGNOSIS — I10 Essential (primary) hypertension: Secondary | ICD-10-CM | POA: Diagnosis not present

## 2018-11-22 DIAGNOSIS — E785 Hyperlipidemia, unspecified: Secondary | ICD-10-CM | POA: Diagnosis not present

## 2018-11-22 DIAGNOSIS — E669 Obesity, unspecified: Secondary | ICD-10-CM | POA: Diagnosis not present

## 2018-11-22 LAB — BASIC METABOLIC PANEL
BUN: 25 mg/dL — ABNORMAL HIGH (ref 6–23)
CO2: 25 mEq/L (ref 19–32)
Calcium: 9 mg/dL (ref 8.4–10.5)
Chloride: 103 mEq/L (ref 96–112)
Creatinine, Ser: 1.41 mg/dL (ref 0.40–1.50)
GFR: 49.83 mL/min — AB (ref >60.00)
Glucose, Bld: 167 mg/dL — ABNORMAL HIGH (ref 70–99)
POTASSIUM: 4.5 meq/L (ref 3.5–5.1)
Sodium: 139 mEq/L (ref 135–145)

## 2018-11-22 LAB — POCT GLYCOSYLATED HEMOGLOBIN (HGB A1C): Hemoglobin A1C: 7.7 % — AB (ref 4.0–5.6)

## 2018-11-22 LAB — HEPATIC FUNCTION PANEL
ALT: 20 U/L (ref 0–53)
AST: 16 U/L (ref 0–37)
Albumin: 4 g/dL (ref 3.5–5.2)
Alkaline Phosphatase: 89 U/L (ref 39–117)
Bilirubin, Direct: 0.1 mg/dL (ref 0.0–0.3)
Total Bilirubin: 0.4 mg/dL (ref 0.2–1.2)
Total Protein: 6.3 g/dL (ref 6.0–8.3)

## 2018-11-22 LAB — LIPID PANEL
Cholesterol: 146 mg/dL (ref 0–200)
HDL: 47.6 mg/dL (ref >39.00)
LDL Cholesterol: 68 mg/dL (ref 0–99)
NonHDL: 98.63
Total CHOL/HDL Ratio: 3
Triglycerides: 154 mg/dL — ABNORMAL HIGH (ref 0.0–149.0)
VLDL: 30.8 mg/dL (ref 0.0–40.0)

## 2018-11-22 LAB — PSA: PSA: 2.61 ng/mL (ref 0.10–4.00)

## 2018-11-22 NOTE — Progress Notes (Signed)
Subjective:     Patient ID: Richard Dalton, male   DOB: 07-Aug-1951, 68 y.o.   MRN: [id number]  HPI Patient in for medical follow-up  Type 2 diabetes.  A1c today 7.7%.  He is on combination therapy with multiple medications.  Poor results with weight loss.  Has been on Victoza for several years and has change of insurance in March and would like to consider Ozempic.  He had recent colonoscopy and had 5 polyps removed.  Pathology is pending.  His blood pressure remained stable.  He takes lisinopril HCTZ.  He has hyperlipidemia treated with Lipitor.  PSA last year 1.83.  Normal range but increased over one-point from the year before.  He denies any obstructive urinary symptoms.  Past Medical History:  Diagnosis Date  . ALLERGIC RHINITIS 11/12/2006  . Allergy   . Asthma 2009   patient denies this dx  . DIABETES MELLITUS, TYPE II 08/07/2006  . ECZEMA 11/08/2008  . ERECTILE DYSFUNCTION 11/12/2006  . GENERALIZED VACCINIA AS COMP MEDICAL CARE NEC 09/09/2010  . GERD (gastroesophageal reflux disease)   . HYPERLIPIDEMIA 08/07/2006  . HYPERTENSION 08/07/2006  . Neoplasm of uncertain behavior of skin 02/07/2009  . OSTEOARTHRITIS 08/07/2006   hands  . PLANTAR FASCIITIS 11/12/2006   Past Surgical History:  Procedure Laterality Date  . COLONOSCOPY  08/2015   [place]-polyps  . KNEE ARTHROSCOPY     right  . TONSILLECTOMY AND ADENOIDECTOMY  1955  . WISDOM TOOTH EXTRACTION      reports that he quit smoking about 31 years ago. His smoking use included cigarettes. He has a 40.00 pack-year smoking history. He has never used smokeless tobacco. He reports current alcohol use of about 2.0 standard drinks of alcohol per week. He reports that he does not use drugs. family history includes Alzheimer's disease in his father; Diabetes in his brother, daughter, mother, and sister. Allergies  Allergen Reactions  . Tetracycline     REACTION: rash     Review of Systems  Constitutional: Negative for fatigue and  unexpected weight change.  Eyes: Negative for visual disturbance.  Respiratory: Negative for cough, chest tightness and shortness of breath.   Cardiovascular: Negative for chest pain, palpitations and leg swelling.  Endocrine: Negative for polydipsia and polyuria.  Neurological: Negative for dizziness, syncope, weakness, light-headedness and headaches.       Objective:   Physical Exam Constitutional:      Appearance: He is well-developed.  HENT:     Right Ear: External ear normal.     Left Ear: External ear normal.  Eyes:     Pupils: Pupils are equal, round, and reactive to light.  Neck:     Musculoskeletal: Neck supple.     Thyroid: No thyromegaly.  Cardiovascular:     Rate and Rhythm: Normal rate and regular rhythm.  Pulmonary:     Effort: Pulmonary effort is normal. No respiratory distress.     Breath sounds: Normal breath sounds. No wheezing or rales.  Neurological:     Mental Status: He is alert and oriented to person, place, and time.        Assessment:     #1 type 2 diabetes.  Suboptimally controlled with A1c 7.7%  #2 hypertension stable and at goal  #3 dyslipidemia  #4 history of increased PSA velocity    Plan:     -Recheck additional labs they with lipid panel, hepatic panel, basic metabolic panel -Recheck PSA -He is encouraged to lose some weight -We will consider change  from Victoza to [place] after change of insurance goes into effect -We will plan 98-month follow-up  [name] MD [facility]

## 2018-11-23 ENCOUNTER — Encounter: Payer: Self-pay | Admitting: Internal Medicine

## 2018-12-13 ENCOUNTER — Telehealth: Payer: Self-pay | Admitting: Family Medicine

## 2018-12-13 NOTE — Telephone Encounter (Signed)
Copied from [place] [id number]. Topic: Quick Communication - See Telephone Encounter >> Dec 13, 2018 12:05 PM Bea Graff, NT wrote: CRM for notification. See Telephone encounter for: 12/13/18. Pt states that his insurance is requesting the INVOKANA 300 MG TABS tablet to have a PA. He is now covered under [place]. He is now using [facility] in [place] for his prescriptions, no longer [facility]. Pt is also wanting to see if the office has samples as [place] will leaving out of the [place] 2 weeks on Saturday. Please advise.

## 2018-12-13 NOTE — Telephone Encounter (Signed)
We do not have any samples.  Please advise.

## 2018-12-13 NOTE — Telephone Encounter (Signed)
No samples.  Can we expedite the PA?

## 2018-12-13 NOTE — Telephone Encounter (Signed)
Please see messages for PA for Invokana 300 mg. Thanks!

## 2018-12-14 NOTE — Telephone Encounter (Signed)
Please see message. Please advise. Message is in red folder on desk.

## 2018-12-14 NOTE — Telephone Encounter (Signed)
Prior auth for Invokana 300mg  sent to [email or web address]-key [id number] with urgent request.

## 2018-12-14 NOTE — Telephone Encounter (Signed)
Fax received from [facility] stating the request did not meet criteria necessary to approve the medication and this was given to Dr Burchette's asst.

## 2018-12-14 NOTE — Telephone Encounter (Signed)
Insurance requiring change from Invokana to Jardiance 10 mg po qd #90 with 3 refills.

## 2018-12-15 ENCOUNTER — Encounter: Payer: Self-pay | Admitting: Family Medicine

## 2018-12-15 ENCOUNTER — Other Ambulatory Visit: Payer: Self-pay

## 2018-12-15 MED ORDER — EMPAGLIFLOZIN 10 MG PO TABS: 10.0000 mg | ORAL_TABLET | Freq: Every day | ORAL | 3 refills | Status: DC

## 2018-12-15 NOTE — Telephone Encounter (Signed)
Answered patient by MyChart this morning. Medication has been sent to the [facility] in [place].

## 2019-02-09 ENCOUNTER — Encounter: Payer: Self-pay | Admitting: Family Medicine

## 2019-02-21 ENCOUNTER — Encounter: Payer: Self-pay | Admitting: Family Medicine

## 2019-02-21 ENCOUNTER — Other Ambulatory Visit: Payer: Self-pay

## 2019-02-21 ENCOUNTER — Ambulatory Visit (INDEPENDENT_AMBULATORY_CARE_PROVIDER_SITE_OTHER): Payer: Managed Care, Other (non HMO) | Admitting: Family Medicine

## 2019-02-21 DIAGNOSIS — R972 Elevated prostate specific antigen [PSA]: Secondary | ICD-10-CM | POA: Diagnosis not present

## 2019-02-21 DIAGNOSIS — E1165 Type 2 diabetes mellitus with hyperglycemia: Secondary | ICD-10-CM

## 2019-02-21 DIAGNOSIS — I10 Essential (primary) hypertension: Secondary | ICD-10-CM | POA: Diagnosis not present

## 2019-02-21 DIAGNOSIS — E785 Hyperlipidemia, unspecified: Secondary | ICD-10-CM

## 2019-02-21 LAB — PSA: PSA: 2.11 ng/mL (ref 0.10–4.00)

## 2019-02-21 LAB — HEMOGLOBIN A1C: Hgb A1c MFr Bld: 8.2 % — ABNORMAL HIGH (ref 4.6–6.5)

## 2019-02-21 MED ORDER — SEMAGLUTIDE(0.25 OR 0.5MG/DOS) 2 MG/1.5ML ~~LOC~~ SOPN: 0.2500 mg | PEN_INJECTOR | SUBCUTANEOUS | 3 refills | Status: DC

## 2019-02-21 NOTE — Progress Notes (Signed)
Patient ID: Richard Dalton, male   DOB: Apr 12, 1951, 68 y.o.   MRN: [id number]  This visit type was conducted due to national recommendations for restrictions regarding the COVID-19 pandemic in an effort to limit this patient's exposure and mitigate transmission in our community.   Virtual Visit via Video Note  I connected with [name] on 02/21/19 at  9:15 AM EDT by a video enabled telemedicine application and verified that I am speaking with the correct person using two identifiers.  Location patient: home Location provider:work or home office Persons participating in the virtual visit: patient, provider  I discussed the limitations of evaluation and management by telemedicine and the availability of in person appointments. The patient expressed understanding and agreed to proceed.   HPI: Patient has type 2 diabetes.  Last A1c 7.7%.  He has been on multiple medications for his diabetes including Victoza.  He is tolerating this well but had requested consideration for changed to Ozempic given data that Ozempic is more effective and A1c lowering.  His weight is stable.  Weight yesterday at home 235 pounds.  Blood pressure 113/68.Marland Kitchen Denies any recent dizziness or chest pains.  Most recent PSA 2.61.  This was increased from 1.83 the year before.  Denies any obstructive urinary symptoms.  We discussed possible repeat PSA  He has hypertension which is been stable with lisinopril HCTZ.  Lipids were well controlled last visit on Lipitor   ROS: See pertinent positives and negatives per HPI.  Past Medical History:  Diagnosis Date  . ALLERGIC RHINITIS 11/12/2006  . Allergy   . Asthma 2009   patient denies this dx  . DIABETES MELLITUS, TYPE II 08/07/2006  . ECZEMA 11/08/2008  . ERECTILE DYSFUNCTION 11/12/2006  . GENERALIZED VACCINIA AS COMP MEDICAL CARE NEC 09/09/2010  . GERD (gastroesophageal reflux disease)   . HYPERLIPIDEMIA 08/07/2006  . HYPERTENSION 08/07/2006  . Neoplasm of uncertain behavior  of skin 02/07/2009  . OSTEOARTHRITIS 08/07/2006   hands  . PLANTAR FASCIITIS 11/12/2006    Past Surgical History:  Procedure Laterality Date  . COLONOSCOPY  08/2015   [place]-polyps  . KNEE ARTHROSCOPY     right  . TONSILLECTOMY AND ADENOIDECTOMY  1955  . WISDOM TOOTH EXTRACTION      Family History  Problem Relation Age of Onset  . Diabetes Mother   . Alzheimer's disease Father        died age 37  . Diabetes Sister        4 sisters with diabetes  . Diabetes Brother   . Diabetes Daughter   . Colon cancer Neg Hx   . Esophageal cancer Neg Hx   . Stomach cancer Neg Hx   . Rectal cancer Neg Hx     SOCIAL HX: Non-smoker   Current Outpatient Medications:  .  aspirin 81 MG tablet, Take 81 mg by mouth daily.  , Disp: , Rfl:  .  atorvastatin (LIPITOR) 20 MG tablet, TAKE 1 TABLET BY MOUTH  DAILY, Disp: 90 tablet, Rfl: 2 .  chlorpheniramine (CHLOR-TRIMETON) 4 MG tablet, Take 4 mg by mouth daily., Disp: , Rfl:  .  CINNAMON PO, Take 1,000 mg by mouth 2 (two) times daily., Disp: , Rfl:  .  DYMISTA 137-50 MCG/ACT SUSP, One spray to each nostril twice daily, Disp: , Rfl:  .  empagliflozin (JARDIANCE) 10 MG TABS tablet, Take 10 mg by mouth daily., Disp: 90 tablet, Rfl: 3 .  famotidine (PEPCID) 20 MG tablet, Take 20 mg by mouth daily.,  Disp: , Rfl:  .  Fluticasone-Salmeterol (ADVAIR) 250-50 MCG/DOSE AEPB, Inhale 1 puff into the lungs every 12 (twelve) hours.  , Disp: , Rfl:  .  INVOKANA 300 MG TABS tablet, TAKE 1 TABLET BY MOUTH  DAILY, Disp: 90 tablet, Rfl: 2 .  IRON PO, Take 65 mg by mouth once., Disp: , Rfl:  .  levocetirizine (XYZAL) 5 MG tablet, Take 5 mg by mouth every evening.  , Disp: , Rfl:  .  Liraglutide (VICTOZA) 18 MG/3ML SOPN, Inject subcutaneously 1.2 mg daily. E11.65, Disp: 18 mL, Rfl: 3 .  lisinopril-hydrochlorothiazide (PRINZIDE,ZESTORETIC) 20-25 MG tablet, TAKE 1 TABLET BY MOUTH  DAILY, Disp: 90 tablet, Rfl: 2 .  metFORMIN (GLUCOPHAGE) 1000 MG tablet, Take 1 tablet (1,000 mg  total) by mouth daily., Disp: 30 tablet, Rfl: 0 .  montelukast (SINGULAIR) 10 MG tablet, Take 10 mg by mouth at bedtime.  , Disp: , Rfl:  .  NOVOFINE 32G X 6 MM MISC, USE ONCE DAILY AS DIRECTED, Disp: 90 each, Rfl: 3 .  pioglitazone (ACTOS) 15 MG tablet, Take 1 tablet (15 mg total) by mouth daily., Disp: 30 tablet, Rfl: 0  EXAM:  VITALS per patient if applicable:  GENERAL: alert, oriented, appears well and in no acute distress  HEENT: atraumatic, conjunttiva clear, no obvious abnormalities on inspection of external nose and ears  NECK: normal movements of the head and neck  LUNGS: on inspection no signs of respiratory distress, breathing rate appears normal, no obvious gross SOB, gasping or wheezing  CV: no obvious cyanosis  MS: moves all visible extremities without noticeable abnormality  PSYCH/NEURO: pleasant and cooperative, no obvious depression or anxiety, speech and thought processing grossly intact  ASSESSMENT AND PLAN:  Discussed the following assessment and plan:  #1 type 2 diabetes.  Suboptimal control with most recent A1c 7.7%  -We will look at possible switch from Victoza to Ozempic if we can get this covered by insurance -Repeat A1c and he will come in later today for that  #2 increased PSA velocity -Recheck PSA today  #3 hypertension stable and at goal  #4 dyslipidemia stable on Lipitor     I discussed the assessment and treatment plan with the patient. The patient was provided an opportunity to ask questions and all were answered. The patient agreed with the plan and demonstrated an understanding of the instructions.   The patient was advised to call back or seek an in-person evaluation if the symptoms worsen or if the condition fails to improve as anticipated.   Carolann Littler, MD

## 2019-02-22 ENCOUNTER — Telehealth: Payer: Self-pay | Admitting: Family Medicine

## 2019-02-22 NOTE — Telephone Encounter (Signed)
PA for the ozempic has been initiated and it has gone into clinical review.

## 2019-02-23 ENCOUNTER — Other Ambulatory Visit: Payer: Self-pay

## 2019-02-23 MED ORDER — LIRAGLUTIDE 18 MG/3ML ~~LOC~~ SOPN: 1.2000 mg | PEN_INJECTOR | Freq: Every day | SUBCUTANEOUS | 1 refills | Status: DC

## 2019-02-24 NOTE — Telephone Encounter (Signed)
PA has been denied. Denial letter has been placed in Dr. Anastasio Auerbach folder. Please return to [facility] with the course of action once reviewed.

## 2019-03-01 ENCOUNTER — Encounter: Payer: Self-pay | Admitting: Family Medicine

## 2019-03-01 NOTE — Telephone Encounter (Signed)
I have typed out letter to clarify their denial.

## 2019-03-01 NOTE — Telephone Encounter (Signed)
Letter has been faxed to [phone] on 03/01/19 for patient.

## 2019-03-02 ENCOUNTER — Telehealth: Payer: Self-pay

## 2019-03-02 NOTE — Telephone Encounter (Signed)
Copied from [place] [id number]. Topic: General - Other >> Mar 02, 2019 11:52 AM Yvette Rack wrote: Reason for CRM: Roanna Epley with [facility] stated they have been sending the request for prior authorization for Semaglutide,0.25 or 0.5MG /DOS, (OZEMPIC, 0.25 OR 0.5 MG/DOSE,) 2 MG/1.5ML SOPN since 02/21/19 but has not received a response. Pharmacy request PA

## 2019-03-03 NOTE — Telephone Encounter (Signed)
PA has been sent to cover my meds.   (Key: [id number])

## 2019-03-04 ENCOUNTER — Encounter: Payer: Self-pay | Admitting: Family Medicine

## 2019-03-07 NOTE — Telephone Encounter (Signed)
PA has been denied. Denial letter states that Ozempic has a limit of 2 pens per 28 days.  Paper work has been put in Dr. Erick Blinks red folder.

## 2019-03-15 ENCOUNTER — Encounter: Payer: Self-pay | Admitting: Family Medicine

## 2019-03-17 ENCOUNTER — Other Ambulatory Visit: Payer: Self-pay

## 2019-03-17 ENCOUNTER — Ambulatory Visit (INDEPENDENT_AMBULATORY_CARE_PROVIDER_SITE_OTHER): Payer: Managed Care, Other (non HMO) | Admitting: Family Medicine

## 2019-03-17 DIAGNOSIS — M25512 Pain in left shoulder: Secondary | ICD-10-CM

## 2019-03-17 MED ORDER — DICLOFENAC SODIUM 1 % TD GEL: 2.0000 g | Freq: Four times a day (QID) | TRANSDERMAL | 2 refills | Status: DC

## 2019-03-17 NOTE — Progress Notes (Signed)
Patient ID: Richard Dalton, male   DOB: 09/26/1951, 68 y.o.   MRN: [id number]  This visit type was conducted due to national recommendations for restrictions regarding the COVID-19 pandemic in an effort to limit this patient's exposure and mitigate transmission in our community.   Virtual Visit via Video Note  I connected with Richard Dalton on 03/17/19 at 10:30 AM EDT by a video enabled telemedicine application and verified that I am speaking with the correct person using two identifiers.  Location patient: home Location provider:work or home office Persons participating in the virtual visit: patient, provider  I discussed the limitations of evaluation and management by telemedicine and the availability of in person appointments. The patient expressed understanding and agreed to proceed.   HPI: Patient has had some left shoulder pain.  This is actually more in the deltoid region.  This started after moving some boxes over the weekend.  He is not aware of specific injury but had pain the next day.  His pain is worse with movement such as abduction but some at rest at night.  He has tried heat, ice, and Aleve without improvement.  No radiculitis symptoms.  No obvious weakness.  Denies prior history of similar injury.   ROS: See pertinent positives and negatives per HPI.  Past Medical History:  Diagnosis Date  . ALLERGIC RHINITIS 11/12/2006  . Allergy   . Asthma 2009   patient denies this dx  . DIABETES MELLITUS, TYPE II 08/07/2006  . ECZEMA 11/08/2008  . ERECTILE DYSFUNCTION 11/12/2006  . GENERALIZED VACCINIA AS COMP MEDICAL CARE NEC 09/09/2010  . GERD (gastroesophageal reflux disease)   . HYPERLIPIDEMIA 08/07/2006  . HYPERTENSION 08/07/2006  . Neoplasm of uncertain behavior of skin 02/07/2009  . OSTEOARTHRITIS 08/07/2006   hands  . PLANTAR FASCIITIS 11/12/2006    Past Surgical History:  Procedure Laterality Date  . COLONOSCOPY  08/2015   [place]-polyps  . KNEE ARTHROSCOPY     right  .  TONSILLECTOMY AND ADENOIDECTOMY  1955  . WISDOM TOOTH EXTRACTION      Family History  Problem Relation Age of Onset  . Diabetes Mother   . Alzheimer's disease Father        died age 22  . Diabetes Sister        4 sisters with diabetes  . Diabetes Brother   . Diabetes Daughter   . Colon cancer Neg Hx   . Esophageal cancer Neg Hx   . Stomach cancer Neg Hx   . Rectal cancer Neg Hx     SOCIAL HX: non-smoker.   Current Outpatient Medications:  .  aspirin 81 MG tablet, Take 81 mg by mouth daily.  , Disp: , Rfl:  .  atorvastatin (LIPITOR) 20 MG tablet, TAKE 1 TABLET BY MOUTH  DAILY, Disp: 90 tablet, Rfl: 2 .  chlorpheniramine (CHLOR-TRIMETON) 4 MG tablet, Take 4 mg by mouth daily., Disp: , Rfl:  .  CINNAMON PO, Take 1,000 mg by mouth 2 (two) times daily., Disp: , Rfl:  .  diclofenac sodium (VOLTAREN) 1 % GEL, Apply 2 g topically 4 (four) times daily., Disp: 100 g, Rfl: 2 .  DYMISTA 137-50 MCG/ACT SUSP, One spray to each nostril twice daily, Disp: , Rfl:  .  empagliflozin (JARDIANCE) 10 MG TABS tablet, Take 10 mg by mouth daily., Disp: 90 tablet, Rfl: 3 .  famotidine (PEPCID) 20 MG tablet, Take 20 mg by mouth daily., Disp: , Rfl:  .  Fluticasone-Salmeterol (ADVAIR) 250-50 MCG/DOSE AEPB, Inhale 1  puff into the lungs every 12 (twelve) hours.  , Disp: , Rfl:  .  INVOKANA 300 MG TABS tablet, TAKE 1 TABLET BY MOUTH  DAILY, Disp: 90 tablet, Rfl: 2 .  IRON PO, Take 65 mg by mouth once., Disp: , Rfl:  .  levocetirizine (XYZAL) 5 MG tablet, Take 5 mg by mouth every evening.  , Disp: , Rfl:  .  liraglutide (VICTOZA) 18 MG/3ML SOPN, Inject 0.2 mLs (1.2 mg total) into the skin daily., Disp: 9 mL, Rfl: 1 .  lisinopril-hydrochlorothiazide (PRINZIDE,ZESTORETIC) 20-25 MG tablet, TAKE 1 TABLET BY MOUTH  DAILY, Disp: 90 tablet, Rfl: 2 .  metFORMIN (GLUCOPHAGE) 1000 MG tablet, Take 1 tablet (1,000 mg total) by mouth daily., Disp: 30 tablet, Rfl: 0 .  montelukast (SINGULAIR) 10 MG tablet, Take 10 mg by  mouth at bedtime.  , Disp: , Rfl:  .  NOVOFINE 32G X 6 MM MISC, USE ONCE DAILY AS DIRECTED, Disp: 90 each, Rfl: 3 .  pioglitazone (ACTOS) 15 MG tablet, Take 1 tablet (15 mg total) by mouth daily., Disp: 30 tablet, Rfl: 0 .  Semaglutide,0.25 or 0.5MG /DOS, (OZEMPIC, 0.25 OR 0.5 MG/DOSE,) 2 MG/1.5ML SOPN, Inject 0.25 mg into the skin once a week. Start 0.25 mg [place] q week for 4 weeks and then increase to 0.5 mg q week., Disp: 4.5 mL, Rfl: 3  EXAM:  VITALS per patient if applicable:  GENERAL: alert, oriented, appears well and in no acute distress  HEENT: atraumatic, conjunttiva clear, no obvious abnormalities on inspection of external nose and ears  NECK: normal movements of the head and neck  LUNGS: on inspection no signs of respiratory distress, breathing rate appears normal, no obvious gross SOB, gasping or wheezing  CV: no obvious cyanosis  MS: moves all visible extremities without noticeable abnormality  PSYCH/NEURO: pleasant and cooperative, no obvious depression or anxiety, speech and thought processing grossly intact  ASSESSMENT AND PLAN:  Discussed the following assessment and plan:  Acute left shoulder pain.  Suspect strain versus inflammation from rotator cuff tendinitis  -Trial of diclofenac 1% gel to use 3-4 times daily as needed -Gentle range of motion as tolerated -Touch base by next week if not improving   I discussed the assessment and treatment plan with the patient. The patient was provided an opportunity to ask questions and all were answered. The patient agreed with the plan and demonstrated an understanding of the instructions.   The patient was advised to call back or seek an in-person evaluation if the symptoms worsen or if the condition fails to improve as anticipated.   Carolann Littler, MD

## 2019-05-11 ENCOUNTER — Encounter: Payer: Self-pay | Admitting: Family Medicine

## 2019-05-16 ENCOUNTER — Ambulatory Visit (INDEPENDENT_AMBULATORY_CARE_PROVIDER_SITE_OTHER): Payer: Managed Care, Other (non HMO) | Admitting: Family Medicine

## 2019-05-16 ENCOUNTER — Other Ambulatory Visit: Payer: Self-pay

## 2019-05-16 DIAGNOSIS — E1165 Type 2 diabetes mellitus with hyperglycemia: Secondary | ICD-10-CM | POA: Diagnosis not present

## 2019-05-16 NOTE — Progress Notes (Signed)
Patient ID: Richard Dalton, male   DOB: [birth year], 68 y.o.   MRN: [id number] .bwbcox   Virtual Visit via Video Note  I connected with Richard Dalton on 05/16/19 at  8:30 AM EDT by a video enabled telemedicine application and verified that I am speaking with the correct person using two identifiers.  Location patient: home Location provider:work or home office Persons participating in the virtual visit: patient, provider  I discussed the limitations of evaluation and management by telemedicine and the availability of in person appointments. The patient expressed understanding and agreed to proceed.   HPI: Follow-up regarding type 2 diabetes.  We were able to get Ozempic started and is currently on 0.5 mg once weekly.  He is seeing good results thus far with decreased appetite and 7 pound weight loss.  Current weight 229 pounds.  Not monitoring blood sugars regularly.  Last A1c 8.2%.  Shoulder pain from last visit is some improved with topical diclofenac.  No significant night pain.  No loss of range of motion.  No definite weakness.   ROS: See pertinent positives and negatives per HPI.  Past Medical History:  Diagnosis Date  . ALLERGIC RHINITIS 11/12/2006  . Allergy   . Asthma 2009   patient denies this dx  . DIABETES MELLITUS, TYPE II 08/07/2006  . ECZEMA 11/08/2008  . ERECTILE DYSFUNCTION 11/12/2006  . GENERALIZED VACCINIA AS COMP MEDICAL CARE NEC 09/09/2010  . GERD (gastroesophageal reflux disease)   . HYPERLIPIDEMIA 08/07/2006  . HYPERTENSION 08/07/2006  . Neoplasm of uncertain behavior of skin 02/07/2009  . OSTEOARTHRITIS 08/07/2006   hands  . PLANTAR FASCIITIS 11/12/2006    Past Surgical History:  Procedure Laterality Date  . COLONOSCOPY  08/2015   [place]-polyps  . KNEE ARTHROSCOPY     right  . TONSILLECTOMY AND ADENOIDECTOMY  1955  . WISDOM TOOTH EXTRACTION      Family History  Problem Relation Age of Onset  . Diabetes Mother   . Alzheimer's disease Father        died age 46   . Diabetes Sister        4 sisters with diabetes  . Diabetes Brother   . Diabetes Daughter   . Colon cancer Neg Hx   . Esophageal cancer Neg Hx   . Stomach cancer Neg Hx   . Rectal cancer Neg Hx     SOCIAL HX: Non-smoker   Current Outpatient Medications:  .  aspirin 81 MG tablet, Take 81 mg by mouth daily.  , Disp: , Rfl:  .  atorvastatin (LIPITOR) 20 MG tablet, TAKE 1 TABLET BY MOUTH  DAILY, Disp: 90 tablet, Rfl: 2 .  chlorpheniramine (CHLOR-TRIMETON) 4 MG tablet, Take 4 mg by mouth daily., Disp: , Rfl:  .  diclofenac sodium (VOLTAREN) 1 % GEL, Apply 2 g topically 4 (four) times daily., Disp: 100 g, Rfl: 2 .  DYMISTA 137-50 MCG/ACT SUSP, One spray to each nostril twice daily, Disp: , Rfl:  .  empagliflozin (JARDIANCE) 10 MG TABS tablet, Take 10 mg by mouth daily., Disp: 90 tablet, Rfl: 3 .  famotidine (PEPCID) 20 MG tablet, Take 20 mg by mouth daily., Disp: , Rfl:  .  Fluticasone-Salmeterol (ADVAIR) 250-50 MCG/DOSE AEPB, Inhale 1 puff into the lungs every 12 (twelve) hours.  , Disp: , Rfl:  .  IRON PO, Take 65 mg by mouth once., Disp: , Rfl:  .  levocetirizine (XYZAL) 5 MG tablet, Take 5 mg by mouth every evening.  , Disp: ,  Rfl:  .  lisinopril-hydrochlorothiazide (PRINZIDE,ZESTORETIC) 20-25 MG tablet, TAKE 1 TABLET BY MOUTH  DAILY, Disp: 90 tablet, Rfl: 2 .  metFORMIN (GLUCOPHAGE) 1000 MG tablet, Take 1 tablet (1,000 mg total) by mouth daily., Disp: 30 tablet, Rfl: 0 .  montelukast (SINGULAIR) 10 MG tablet, Take 10 mg by mouth at bedtime.  , Disp: , Rfl:  .  NOVOFINE 32G X 6 MM MISC, USE ONCE DAILY AS DIRECTED, Disp: 90 each, Rfl: 3 .  pioglitazone (ACTOS) 15 MG tablet, Take 1 tablet (15 mg total) by mouth daily., Disp: 30 tablet, Rfl: 0 .  Semaglutide,0.25 or 0.5MG /DOS, (OZEMPIC, 0.25 OR 0.5 MG/DOSE,) 2 MG/1.5ML SOPN, Inject 0.25 mg into the skin once a week. Start 0.25 mg [place] q week for 4 weeks and then increase to 0.5 mg q week., Disp: 4.5 mL, Rfl: 3  EXAM:  VITALS per patient  if applicable:  GENERAL: alert, oriented, appears well and in no acute distress  HEENT: atraumatic, conjunttiva clear, no obvious abnormalities on inspection of external nose and ears  NECK: normal movements of the head and neck  LUNGS: on inspection no signs of respiratory distress, breathing rate appears normal, no obvious gross SOB, gasping or wheezing  CV: no obvious cyanosis  MS: moves all visible extremities without noticeable abnormality  PSYCH/NEURO: pleasant and cooperative, no obvious depression or anxiety, speech and thought processing grossly intact  ASSESSMENT AND PLAN:  Discussed the following assessment and plan:  Type 2 diabetes mellitus with hyperglycemia, without long-term current use of insulin (HCC)-hopefully improving with recent addition of Ozempic.  He has lost some weight and is having increased appetite control.  We decided to go ahead and follow-up around early November and repeat A1c then.  This will be an in-office visit.     I discussed the assessment and treatment plan with the patient. The patient was provided an opportunity to ask questions and all were answered. The patient agreed with the plan and demonstrated an understanding of the instructions.   The patient was advised to call back or seek an in-person evaluation if the symptoms worsen or if the condition fails to improve as anticipated.    Carolann Littler, MD

## 2019-06-30 ENCOUNTER — Other Ambulatory Visit: Payer: Self-pay | Admitting: Family Medicine

## 2019-07-18 LAB — HM DIABETES EYE EXAM

## 2019-08-15 ENCOUNTER — Other Ambulatory Visit: Payer: Self-pay

## 2019-08-15 ENCOUNTER — Ambulatory Visit: Payer: Managed Care, Other (non HMO) | Admitting: Family Medicine

## 2019-08-15 ENCOUNTER — Encounter: Payer: Self-pay | Admitting: Family Medicine

## 2019-08-15 ENCOUNTER — Other Ambulatory Visit: Payer: Self-pay | Admitting: Family Medicine

## 2019-08-15 VITALS — BP 118/54 | HR 94 | Temp 98.5°F | Resp 18 | Ht 68.0 in | Wt 224.2 lb

## 2019-08-15 DIAGNOSIS — E785 Hyperlipidemia, unspecified: Secondary | ICD-10-CM

## 2019-08-15 DIAGNOSIS — S90211A Contusion of right great toe with damage to nail, initial encounter: Secondary | ICD-10-CM | POA: Diagnosis not present

## 2019-08-15 DIAGNOSIS — E1165 Type 2 diabetes mellitus with hyperglycemia: Secondary | ICD-10-CM

## 2019-08-15 DIAGNOSIS — I10 Essential (primary) hypertension: Secondary | ICD-10-CM | POA: Diagnosis not present

## 2019-08-15 LAB — POCT GLYCOSYLATED HEMOGLOBIN (HGB A1C): Hemoglobin A1C: 7.3 % — AB (ref 4.0–5.6)

## 2019-08-15 NOTE — Progress Notes (Signed)
Subjective:     Patient ID: Richard Dalton, male   DOB: 07/08/1951, 68 y.o.   MRN: [id number]  HPI Richard Dalton is here for follow-up regarding type 2 diabetes.  His last A1c was 8.2%.  He was started on Ozempic and has had some weight loss of 12 pounds since last weight here in February and he is also getting more activity in terms of exercise.  He is not monitoring his sugars regularly.  Just had recent eye exam in October.  No retinopathy.  Hypertension treated with lisinopril HCTZ.  Blood pressure stable He has hyperlipidemia treated with Lipitor.  Will be due for lipids in February.  No significant myalgias.  Flu vaccine already given  He did have injury to right great toe several weeks ago.  Dropped an object on this with subungual hematoma.  Has not seen signs of secondary infection.  Nail seems to be firmly anchored at this time.  No associated pain at this time.  Past Medical History:  Diagnosis Date  . ALLERGIC RHINITIS 11/12/2006  . Allergy   . Asthma 2009   patient denies this dx  . DIABETES MELLITUS, TYPE II 08/07/2006  . ECZEMA 11/08/2008  . ERECTILE DYSFUNCTION 11/12/2006  . GENERALIZED VACCINIA AS COMP MEDICAL CARE NEC 09/09/2010  . GERD (gastroesophageal reflux disease)   . HYPERLIPIDEMIA 08/07/2006  . HYPERTENSION 08/07/2006  . Neoplasm of uncertain behavior of skin 02/07/2009  . OSTEOARTHRITIS 08/07/2006   hands  . PLANTAR FASCIITIS 11/12/2006   Past Surgical History:  Procedure Laterality Date  . COLONOSCOPY  08/2015   [place]-polyps  . KNEE ARTHROSCOPY     right  . TONSILLECTOMY AND ADENOIDECTOMY  1955  . WISDOM TOOTH EXTRACTION      reports that he quit smoking about 32 years ago. His smoking use included cigarettes. He has a 40.00 pack-year smoking history. He has never used smokeless tobacco. He reports current alcohol use of about 2.0 standard drinks of alcohol per week. He reports that he does not use drugs. family history includes Alzheimer's disease in his father; Diabetes  in his brother, daughter, mother, and sister. Allergies  Allergen Reactions  . Tetracycline     REACTION: rash     Review of Systems  Constitutional: Negative for fatigue.  Eyes: Negative for visual disturbance.  Respiratory: Negative for cough, chest tightness and shortness of breath.   Cardiovascular: Negative for chest pain, palpitations and leg swelling.  Endocrine: Negative for polydipsia and polyuria.  Neurological: Negative for dizziness, syncope, weakness, light-headedness and headaches.       Objective:   Physical Exam Constitutional:      Appearance: He is well-developed.  HENT:     Right Ear: External ear normal.     Left Ear: External ear normal.  Eyes:     Pupils: Pupils are equal, round, and reactive to light.  Neck:     Musculoskeletal: Neck supple.     Thyroid: No thyromegaly.  Cardiovascular:     Rate and Rhythm: Normal rate and regular rhythm.  Pulmonary:     Effort: Pulmonary effort is normal. No respiratory distress.     Breath sounds: Normal breath sounds. No wheezing or rales.  Neurological:     Mental Status: He is alert and oriented to person, place, and time.        Assessment:     #1 type 2 diabetes improved with A1c today 7.3%.  This is reduced from 8.2% with recent addition of Ozempic.  #2 hypertension  stable and at goal  #3 hyperlipidemia stable on Lipitor  #4 recent subungual hematoma right great toe.  No signs of secondary infection and nail intact    Plan:     -Continue weight loss efforts.  We agreed to give another 3 months with weight control and lifestyle management and then recheck A1c along with other labs including lipids at follow-up in 3 months -Follow-up for any signs of secondary infection right great toe and examined feet closely and daily  [name] MD [facility]

## 2019-08-15 NOTE — Patient Instructions (Signed)
Hemoglobin A1c improved from 8.2 to 7.3%  Suggest continued weight loss efforts and let's plan 40-month follow-up

## 2019-09-27 ENCOUNTER — Encounter: Payer: Self-pay | Admitting: Family Medicine

## 2019-10-07 HISTORY — PX: TOENAIL EXCISION: SUR558

## 2019-10-15 ENCOUNTER — Encounter: Payer: Self-pay | Admitting: Family Medicine

## 2019-10-17 ENCOUNTER — Other Ambulatory Visit: Payer: Self-pay

## 2019-10-17 DIAGNOSIS — S90211A Contusion of right great toe with damage to nail, initial encounter: Secondary | ICD-10-CM

## 2019-10-27 ENCOUNTER — Other Ambulatory Visit: Payer: Self-pay

## 2019-10-27 ENCOUNTER — Ambulatory Visit: Payer: Managed Care, Other (non HMO) | Admitting: Podiatry

## 2019-10-27 ENCOUNTER — Encounter: Payer: Self-pay | Admitting: Family Medicine

## 2019-10-27 DIAGNOSIS — L601 Onycholysis: Secondary | ICD-10-CM

## 2019-10-27 DIAGNOSIS — L608 Other nail disorders: Secondary | ICD-10-CM | POA: Diagnosis not present

## 2019-10-27 DIAGNOSIS — L603 Nail dystrophy: Secondary | ICD-10-CM | POA: Diagnosis not present

## 2019-10-27 NOTE — Progress Notes (Signed)
  Subjective:  Patient ID: Richard Dalton, male    DOB: [birth year],  MRN: [id number]  Chief Complaint  Patient presents with  . Nail Problem    pt has right big toenail of the right big toenail, pt states that he recently got his nail stuck on a sock, and caused it to be pull back, a couple of months ago. pt also states that the right big toenail is elevated when applying pressure, pt has tried different types of medical bandages but not much has helped.    69 y.o. male presents with the above complaint. History confirmed with patient.   Objective:  Physical Exam: warm, good capillary refill, nail exam lysis and subungual hematoma right hallux, no trophic changes or ulcerative lesions, normal DP and PT pulses, and normal sensory exam. Left Foot: normal exam, no swelling, tenderness, instability; ligaments intact, full range of motion of all ankle/foot joints  Right Foot: hallux rigidus right 1st MPJ without pain to palpation   Assessment:   1. Onycholysis   2. Nail dystrophy   3. Subungual hemorrhage      Plan:  Patient was evaluated and treated and all questions answered.  Onycholysis Right 1st Toenail -Patient elects to proceed with toenail removal today -Nail avulsed. See procedure note. -Educated on post-procedure care including soaking. Written instructions provided. -Patient to follow up in 2 weeks for nail check.  Procedure: Avulsion of toenail Location: Right 1st toe  Anesthesia: Lidocaine 1% plain; 1.5 mL and Marcaine 0.5% plain; 1.5 mL, digital block. Skin Prep: Betadine. Dressing: Silvadene; telfa; dry, sterile, compression dressing. Technique: Following skin prep, the toe was exsanguinated and a tourniquet was secured at the base of the toe. The nail was freed and avulsed with a hemostat. The area was cleansed. The tourniquet was then removed and sterile dressing applied. Disposition: Patient tolerated procedure well.   Return in about 2 weeks (around 11/10/2019) for  Nail Check.

## 2019-10-27 NOTE — Patient Instructions (Signed)

## 2019-11-10 ENCOUNTER — Ambulatory Visit (INDEPENDENT_AMBULATORY_CARE_PROVIDER_SITE_OTHER): Payer: Managed Care, Other (non HMO) | Admitting: Podiatry

## 2019-11-10 ENCOUNTER — Other Ambulatory Visit: Payer: Self-pay

## 2019-11-10 DIAGNOSIS — L603 Nail dystrophy: Secondary | ICD-10-CM | POA: Diagnosis not present

## 2019-11-10 DIAGNOSIS — L601 Onycholysis: Secondary | ICD-10-CM

## 2019-11-10 NOTE — Progress Notes (Signed)
  Subjective:  Patient ID: Richard Dalton, male    DOB: Feb 19, 1951,  MRN: [id number]  Chief Complaint  Patient presents with  . Nail Problem    pt is here for a nail avulsion f/u of the right big toenail, pt also shows no signs of infection. pt states that he no longer has any pain.    69 y.o. male presents for follow up of nail procedure. History confirmed with patient.   Objective:  Physical Exam: Ingrown nail avulsion site: overlying soft crust, no warmth, no drainage and no erythema Assessment:   1. Onycholysis   2. Nail dystrophy    Plan:  Patient was evaluated and treated and all questions answered.  S/p Ingrown Toenail Excision, right -Healing well without issue. -Discussed return precautions. -F/u PRN

## 2019-11-14 ENCOUNTER — Other Ambulatory Visit: Payer: Self-pay

## 2019-11-15 ENCOUNTER — Other Ambulatory Visit: Payer: Self-pay

## 2019-11-15 ENCOUNTER — Encounter: Payer: Self-pay | Admitting: Family Medicine

## 2019-11-15 ENCOUNTER — Ambulatory Visit (INDEPENDENT_AMBULATORY_CARE_PROVIDER_SITE_OTHER): Payer: Medicare Other | Admitting: Family Medicine

## 2019-11-15 VITALS — BP 122/68 | HR 102 | Temp 97.5°F | Ht 68.75 in | Wt 223.1 lb

## 2019-11-15 DIAGNOSIS — Z125 Encounter for screening for malignant neoplasm of prostate: Secondary | ICD-10-CM | POA: Diagnosis not present

## 2019-11-15 DIAGNOSIS — E785 Hyperlipidemia, unspecified: Secondary | ICD-10-CM | POA: Diagnosis not present

## 2019-11-15 DIAGNOSIS — E1165 Type 2 diabetes mellitus with hyperglycemia: Secondary | ICD-10-CM | POA: Diagnosis not present

## 2019-11-15 DIAGNOSIS — I10 Essential (primary) hypertension: Secondary | ICD-10-CM

## 2019-11-15 LAB — LIPID PANEL
Cholesterol: 149 mg/dL (ref 0–200)
HDL: 45.9 mg/dL (ref >39.00)
NonHDL: 102.72
Total CHOL/HDL Ratio: 3
Triglycerides: 210 mg/dL — ABNORMAL HIGH (ref 0.0–149.0)
VLDL: 42 mg/dL — ABNORMAL HIGH (ref 0.0–40.0)

## 2019-11-15 LAB — HEPATIC FUNCTION PANEL
ALT: 20 U/L (ref 0–53)
AST: 15 U/L (ref 0–37)
Albumin: 4.1 g/dL (ref 3.5–5.2)
Alkaline Phosphatase: 92 U/L (ref 39–117)
Bilirubin, Direct: 0.1 mg/dL (ref 0.0–0.3)
Total Bilirubin: 0.6 mg/dL (ref 0.2–1.2)
Total Protein: 6.7 g/dL (ref 6.0–8.3)

## 2019-11-15 LAB — BASIC METABOLIC PANEL
BUN: 31 mg/dL — ABNORMAL HIGH (ref 6–23)
CO2: 28 mEq/L (ref 19–32)
Calcium: 9.4 mg/dL (ref 8.4–10.5)
Chloride: 102 mEq/L (ref 96–112)
Creatinine, Ser: 1.43 mg/dL (ref 0.40–1.50)
GFR: 48.89 mL/min — ABNORMAL LOW (ref >60.00)
Glucose, Bld: 176 mg/dL — ABNORMAL HIGH (ref 70–99)
Potassium: 4.5 mEq/L (ref 3.5–5.1)
Sodium: 138 mEq/L (ref 135–145)

## 2019-11-15 LAB — LDL CHOLESTEROL, DIRECT: Direct LDL: 80 mg/dL

## 2019-11-15 LAB — POCT GLYCOSYLATED HEMOGLOBIN (HGB A1C): Hemoglobin A1C: 7.1 % — AB (ref 4.0–5.6)

## 2019-11-15 LAB — PSA: PSA: 2.28 ng/mL (ref 0.10–4.00)

## 2019-11-15 NOTE — Progress Notes (Signed)
Subjective:     Patient ID: Richard Dalton, male   DOB: 12-12-1950, 69 y.o.   MRN: [id number]  HPI   Richard Dalton is  seen for medical follow-up.  Several months ago we added Ozempic to his diabetes regimen and he has had some weight loss and gradual improvement in A1c's.  He had A1c of 8.2% and this had improved to 7.3% last visit.  His weight is down another pound.  His diabetes medications are reviewed and he has been compliant with all.  He had recent toe no excision per podiatry.  He has no foot problems at this time.  He has hypertension which is been stable on lisinopril HCTZ.  He has hyperlipidemia which is treated with atorvastatin and is due for lipids at this time.  No recent myalgias.  He has history of asthma and allergies and is on Singulair and Advair.  He has seen allergist in the past but would like for [place] to take that over if possible.  Past Medical History:  Diagnosis Date  . ALLERGIC RHINITIS 11/12/2006  . Allergy   . Asthma 2009   patient denies this dx  . DIABETES MELLITUS, TYPE II 08/07/2006  . ECZEMA 11/08/2008  . ERECTILE DYSFUNCTION 11/12/2006  . GENERALIZED VACCINIA AS COMP MEDICAL CARE NEC 09/09/2010  . GERD (gastroesophageal reflux disease)   . HYPERLIPIDEMIA 08/07/2006  . HYPERTENSION 08/07/2006  . Neoplasm of uncertain behavior of skin 02/07/2009  . OSTEOARTHRITIS 08/07/2006   hands  . PLANTAR FASCIITIS 11/12/2006   Past Surgical History:  Procedure Laterality Date  . COLONOSCOPY  08/2015   Richard Dalton-polyps  . KNEE ARTHROSCOPY     right  . TOENAIL EXCISION  10/2019  . TONSILLECTOMY AND ADENOIDECTOMY  1955  . WISDOM TOOTH EXTRACTION      reports that he quit smoking about 32 years ago. His smoking use included cigarettes. He has a 40.00 pack-year smoking history. He has never used smokeless tobacco. He reports current alcohol use of about 2.0 standard drinks of alcohol per week. He reports that he does not use drugs. family history includes Alzheimer's disease in his  father; Diabetes in his brother, daughter, mother, and sister. Allergies  Allergen Reactions  . Tetracycline     REACTION: rash     Review of Systems  Constitutional: Negative for fatigue.  Eyes: Negative for visual disturbance.  Respiratory: Negative for cough, chest tightness and shortness of breath.   Cardiovascular: Negative for chest pain, palpitations and leg swelling.  Endocrine: Negative for polydipsia and polyuria.  Neurological: Negative for dizziness, syncope, weakness, light-headedness and headaches.       Objective:   Physical Exam Vitals reviewed.  Constitutional:      Appearance: He is well-developed.  HENT:     Right Ear: External ear normal.     Left Ear: External ear normal.  Eyes:     Pupils: Pupils are equal, round, and reactive to light.  Neck:     Thyroid: No thyromegaly.  Cardiovascular:     Rate and Rhythm: Normal rate and regular rhythm.  Pulmonary:     Effort: Pulmonary effort is normal. No respiratory distress.     Breath sounds: Normal breath sounds. No wheezing or rales.  Musculoskeletal:     Cervical back: Neck supple.     Right lower leg: No edema.     Left lower leg: No edema.  Neurological:     Mental Status: He is alert and oriented to person, place, and time.  Assessment:     #1 type 2 diabetes improving with A1c today 7.1%  #2 hypertension stable and at goal  #3 hyperlipidemia treated with Lipitor and has been stable  #4 request for PSA prostate cancer screening.  Last PSA last year was stable at 2.21    Plan:     -Continue weight loss efforts -Check further labs with lipid panel, hepatic panel, basic metabolic panel, and PSA -We will plan 14-month follow-up and sooner as needed  [name] MD [facility] at [facility]

## 2019-11-15 NOTE — Addendum Note (Signed)
Addended by: Anibal Henderson on: 11/15/2019 08:34 AM   Modules accepted: Orders

## 2019-11-16 ENCOUNTER — Other Ambulatory Visit: Payer: Self-pay | Admitting: Family Medicine

## 2019-12-06 DIAGNOSIS — Z0120 Encounter for dental examination and cleaning without abnormal findings: Secondary | ICD-10-CM | POA: Diagnosis not present

## 2019-12-19 ENCOUNTER — Encounter: Payer: Self-pay | Admitting: Family Medicine

## 2020-01-04 ENCOUNTER — Encounter: Payer: Self-pay | Admitting: Family Medicine

## 2020-02-15 ENCOUNTER — Encounter: Payer: Self-pay | Admitting: Family Medicine

## 2020-02-15 MED ORDER — OZEMPIC (0.25 OR 0.5 MG/DOSE) 2 MG/1.5ML ~~LOC~~ SOPN: 0.2500 mg | PEN_INJECTOR | SUBCUTANEOUS | 3 refills | Status: DC

## 2020-02-24 ENCOUNTER — Encounter: Payer: Self-pay | Admitting: Family Medicine

## 2020-02-27 MED ORDER — MONTELUKAST SODIUM 10 MG PO TABS: 10.0000 mg | ORAL_TABLET | Freq: Every day | ORAL | 1 refills | Status: DC

## 2020-03-14 ENCOUNTER — Other Ambulatory Visit: Payer: Self-pay

## 2020-03-14 ENCOUNTER — Ambulatory Visit (INDEPENDENT_AMBULATORY_CARE_PROVIDER_SITE_OTHER): Payer: Medicare Other | Admitting: Family Medicine

## 2020-03-14 ENCOUNTER — Encounter: Payer: Self-pay | Admitting: Family Medicine

## 2020-03-14 VITALS — BP 98/50 | HR 97 | Temp 97.7°F | Wt 215.1 lb

## 2020-03-14 DIAGNOSIS — N1831 Chronic kidney disease, stage 3a: Secondary | ICD-10-CM

## 2020-03-14 DIAGNOSIS — R42 Dizziness and giddiness: Secondary | ICD-10-CM

## 2020-03-14 DIAGNOSIS — I10 Essential (primary) hypertension: Secondary | ICD-10-CM | POA: Diagnosis not present

## 2020-03-14 DIAGNOSIS — N183 Chronic kidney disease, stage 3 unspecified: Secondary | ICD-10-CM | POA: Insufficient documentation

## 2020-03-14 DIAGNOSIS — E1165 Type 2 diabetes mellitus with hyperglycemia: Secondary | ICD-10-CM

## 2020-03-14 LAB — POCT GLYCOSYLATED HEMOGLOBIN (HGB A1C): Hemoglobin A1C: 6.9 % — AB (ref 4.0–5.6)

## 2020-03-14 MED ORDER — LISINOPRIL-HYDROCHLOROTHIAZIDE 10-12.5 MG PO TABS: 1.0000 | ORAL_TABLET | Freq: Every day | ORAL | 3 refills | Status: DC

## 2020-03-14 NOTE — Progress Notes (Signed)
Established Patient Office Visit  Subjective:  Patient ID: Richard Dalton, male    DOB: [birth year]  Age: 69 y.o. MRN: [id number]  CC:  Chief Complaint  Patient presents with   Follow-up    Pt is here for 4 month follow up on diabetes and hypertension     HPI Richard Dalton presents for medical follow-up.  He is continue to lose weight.  In fact he is lost about 8 pounds since February.  They moved to a different house and he has been very active with moving things and thinks this is accounts for some of his weight loss.  His A1c's have been steadily declining from 8.2% last year to 6.9% today.  He is on several medications for diabetes and these were reviewed.  Compliant with all.  He has history of hypertension.  He has had some lightheadedness frequently with things like stooping recently.  Currently on lisinopril HCTZ 20/25 mg one daily.  No syncope.  No palpitations.  No chest pains.  Feels he is staying well-hydrated.  He does have some chronic kidney disease but this is been stable for several years.  We reviewed his labs from February.  His PSA was also stable  Past Medical History:  Diagnosis Date   ALLERGIC RHINITIS 11/12/2006   Allergy    Asthma 2009   patient denies this dx   DIABETES MELLITUS, TYPE II 08/07/2006   ECZEMA 11/08/2008   ERECTILE DYSFUNCTION 11/12/2006   GENERALIZED VACCINIA AS COMP MEDICAL CARE NEC 09/09/2010   GERD (gastroesophageal reflux disease)    HYPERLIPIDEMIA 08/07/2006   HYPERTENSION 08/07/2006   Neoplasm of uncertain behavior of skin 02/07/2009   OSTEOARTHRITIS 08/07/2006   hands   PLANTAR FASCIITIS 11/12/2006    Past Surgical History:  Procedure Laterality Date   COLONOSCOPY  08/2015   [place]-polyps   KNEE ARTHROSCOPY     right   TOENAIL EXCISION  10/2019   TONSILLECTOMY AND ADENOIDECTOMY  1955   WISDOM TOOTH EXTRACTION      Family History  Problem Relation Age of Onset   Diabetes Mother    Alzheimer's disease Father     died age 46   Diabetes Sister        39 sisters with diabetes   Diabetes Brother    Diabetes Daughter    Colon cancer Neg Hx    Esophageal cancer Neg Hx    Stomach cancer Neg Hx    Rectal cancer Neg Hx     Social History   Socioeconomic History   Marital status: Married    Spouse name: Not on file   Number of children: Not on file   Years of education: Not on file   Highest education level: Not on file  Occupational History   Not on file  Tobacco Use   Smoking status: Former Smoker    Packs/day: 2.00    Years: 20.00    Pack years: 40.00    Types: Cigarettes    Quit date: 03/10/1987    Years since quitting: 33.0   Smokeless tobacco: Never Used  Substance and Sexual Activity   Alcohol use: Yes    Alcohol/week: 2.0 standard drinks    Types: 2 Standard drinks or equivalent per week   Drug use: No   Sexual activity: Not on file  Other Topics Concern   Not on file  Social History Narrative   Not on file   Social Determinants of Health   Financial Resource Strain:  Difficulty of Paying Living Expenses:   Food Insecurity:    Worried About Charity fundraiser in the Last Year:    Arboriculturist in the Last Year:   Transportation Needs:    Film/video editor (Medical):    Lack of Transportation (Non-Medical):   Physical Activity:    Days of Exercise per Week:    Minutes of Exercise per Session:   Stress:    Feeling of Stress :   Social Connections:    Frequency of Communication with Friends and Family:    Frequency of Social Gatherings with Friends and Family:    Attends Religious Services:    Active Member of Clubs or Organizations:    Attends Music therapist:    Marital Status:   Intimate Partner Violence:    Fear of Current or Ex-Partner:    Emotionally Abused:    Physically Abused:    Sexually Abused:     Outpatient Medications Prior to Visit  Medication Sig Dispense Refill   aspirin 81 MG  tablet Take 81 mg by mouth daily.       atorvastatin (LIPITOR) 20 MG tablet TAKE 1 TABLET BY MOUTH DAILY 90 tablet 2   chlorpheniramine (CHLOR-TRIMETON) 4 MG tablet Take 4 mg by mouth daily.     empagliflozin (JARDIANCE) 10 MG TABS tablet Take 10 mg by mouth daily. 90 tablet 3   famotidine (PEPCID) 20 MG tablet Take 20 mg by mouth daily.     fluticasone (FLONASE) 50 MCG/ACT nasal spray Place into both nostrils daily.     Fluticasone-Salmeterol (ADVAIR) 250-50 MCG/DOSE AEPB Inhale 1 puff into the lungs every 12 (twelve) hours.       IRON PO Take 65 mg by mouth once.     levocetirizine (XYZAL) 5 MG tablet Take 5 mg by mouth every evening.       metFORMIN (GLUCOPHAGE) 1000 MG tablet TAKE 1 TABLET BY MOUTH DAILY WITH BREAKFAST 90 tablet 1   montelukast (SINGULAIR) 10 MG tablet Take 1 tablet (10 mg total) by mouth at bedtime. 90 tablet 1   pioglitazone (ACTOS) 15 MG tablet TAKE 1 TABLET BY MOUTH DAILY 90 tablet 1   Semaglutide,0.25 or 0.5MG /DOS, (OZEMPIC, 0.25 OR 0.5 MG/DOSE,) 2 MG/1.5ML SOPN Inject 0.25 mg into the skin once a week. Start 0.25 mg [place] q week for 4 weeks and then increase to 0.5 mg q week. 4.5 mL 3   lisinopril-hydrochlorothiazide (ZESTORETIC) 20-25 MG tablet TAKE 1 TABLET BY MOUTH DAILY 90 tablet 2   No facility-administered medications prior to visit.    Allergies  Allergen Reactions   Tetracycline     REACTION: rash    ROS Review of Systems    Objective:    Physical Exam  BP (!) 98/50 (BP Location: Left Arm, Cuff Size: Normal)    Pulse 97    Temp 97.7 F (36.5 C) (Temporal)    Wt 215 lb 1.6 oz (97.6 kg)    SpO2 97%    BMI 32.00 kg/m  Wt Readings from Last 3 Encounters:  03/14/20 215 lb 1.6 oz (97.6 kg)  11/15/19 223 lb 1.6 oz (101.2 kg)  08/15/19 224 lb 3.2 oz (101.7 kg)     There are no preventive care reminders to display for this patient.  There are no preventive care reminders to display for this patient.  Lab Results  Component Value Date    TSH 1.60 11/20/2017   Lab Results  Component Value Date  WBC 5.6 11/20/2017   HGB 15.2 11/20/2017   HCT 45.8 11/20/2017   MCV 94.2 11/20/2017   PLT 231.0 11/20/2017   Lab Results  Component Value Date   NA 138 11/15/2019   K 4.5 11/15/2019   CO2 28 11/15/2019   GLUCOSE 176 (H) 11/15/2019   BUN 31 (H) 11/15/2019   CREATININE 1.43 11/15/2019   BILITOT 0.6 11/15/2019   ALKPHOS 92 11/15/2019   AST 15 11/15/2019   ALT 20 11/15/2019   PROT 6.7 11/15/2019   ALBUMIN 4.1 11/15/2019   CALCIUM 9.4 11/15/2019   GFR 48.89 (L) 11/15/2019   Lab Results  Component Value Date   CHOL 149 11/15/2019   Lab Results  Component Value Date   HDL 45.90 11/15/2019   Lab Results  Component Value Date   LDLCALC 68 11/22/2018   Lab Results  Component Value Date   TRIG 210.0 (H) 11/15/2019   Lab Results  Component Value Date   CHOLHDL 3 11/15/2019   Lab Results  Component Value Date   HGBA1C 6.9 (A) 03/14/2020      Assessment & Plan:   #1 type 2 diabetes.  Improving control with A1c 6.9%  -Continue current diabetic medications and continue weight loss efforts. -If he continues to have improving control may need to back off diabetes medications at some point  #2 hypertension.  Has had some recent lightheadedness.  Repeat blood pressure 114/58 seated and 98/50 standing  -Reduce lisinopril HCTZ to 10/12.5 mg one daily -Day well-hydrated.  Be in touch if still has lightheadedness in a week or two  #3 chronic kidney disease stage IIIa-stable for the past several years  -We will plan routine follow-up in 4 months and sooner as needed    Follow-up: Return in about 4 months (around 07/14/2020).    Carolann Littler, MD

## 2020-03-14 NOTE — Patient Instructions (Signed)
Stop the current dose of Lisinopril HCTZ and start the lower dose.

## 2020-03-28 ENCOUNTER — Other Ambulatory Visit: Payer: Self-pay | Admitting: Family Medicine

## 2020-04-02 ENCOUNTER — Other Ambulatory Visit: Payer: Self-pay | Admitting: Family Medicine

## 2020-04-28 ENCOUNTER — Other Ambulatory Visit: Payer: Self-pay | Admitting: Family Medicine

## 2020-04-29 ENCOUNTER — Encounter: Payer: Self-pay | Admitting: Family Medicine

## 2020-05-15 ENCOUNTER — Other Ambulatory Visit: Payer: Self-pay | Admitting: Family Medicine

## 2020-05-25 DIAGNOSIS — L03114 Cellulitis of left upper limb: Secondary | ICD-10-CM | POA: Diagnosis not present

## 2020-07-14 ENCOUNTER — Other Ambulatory Visit: Payer: Self-pay | Admitting: Family Medicine

## 2020-07-16 ENCOUNTER — Other Ambulatory Visit: Payer: Self-pay

## 2020-07-16 ENCOUNTER — Ambulatory Visit (INDEPENDENT_AMBULATORY_CARE_PROVIDER_SITE_OTHER): Payer: Medicare Other | Admitting: Family Medicine

## 2020-07-16 VITALS — BP 120/72 | HR 72 | Temp 98.5°F | Ht 68.75 in | Wt 216.7 lb

## 2020-07-16 DIAGNOSIS — G5702 Lesion of sciatic nerve, left lower limb: Secondary | ICD-10-CM | POA: Diagnosis not present

## 2020-07-16 DIAGNOSIS — E1165 Type 2 diabetes mellitus with hyperglycemia: Secondary | ICD-10-CM | POA: Diagnosis not present

## 2020-07-16 DIAGNOSIS — I10 Essential (primary) hypertension: Secondary | ICD-10-CM

## 2020-07-16 DIAGNOSIS — M1991 Primary osteoarthritis, unspecified site: Secondary | ICD-10-CM

## 2020-07-16 LAB — POCT GLYCOSYLATED HEMOGLOBIN (HGB A1C): Hemoglobin A1C: 7.1 % — AB (ref 4.0–5.6)

## 2020-07-16 NOTE — Progress Notes (Signed)
Established Patient Office Visit  Subjective:  Patient ID: Richard Dalton, male    DOB: 17-Feb-1951  Age: 69 y.o. MRN: [id number]  CC:  Chief Complaint  Patient presents with  . Diabetes    3 month follow up    HPI Kalonji Zurawski presents for medical follow-up.  He has history of obesity, type 2 diabetes, dyslipidemia, hypertension.  He had lost some weight and had some low blood pressure last visit.  We reduced dose of his lisinopril and blood pressure seem to be improved at this time with somewhat less dizziness.  A1c's have improved.  He seemed to improve after adding Ozempic.  No recent hypoglycemic symptoms.  He has dyslipidemia which is treated with atorvastatin.  Lipids were checked last February.  He has a couple of acute joint issues.  Has had some pains left CMC joint.  No injury.  Denies any numbness or weakness in the hand.  No other digital involvement.  He also has noticed some left "hip "pain.  His pain is actually more in the buttock in the sciatic region.  His pain is consistently worse with prolonged periods of sitting and driving.  He denies any radiculitis symptoms.  No numbness or weakness.  Past Medical History:  Diagnosis Date  . ALLERGIC RHINITIS 11/12/2006  . Allergy   . Asthma 2009   patient denies this dx  . DIABETES MELLITUS, TYPE II 08/07/2006  . ECZEMA 11/08/2008  . ERECTILE DYSFUNCTION 11/12/2006  . GENERALIZED VACCINIA AS COMP MEDICAL CARE NEC 09/09/2010  . GERD (gastroesophageal reflux disease)   . HYPERLIPIDEMIA 08/07/2006  . HYPERTENSION 08/07/2006  . Neoplasm of uncertain behavior of skin 02/07/2009  . OSTEOARTHRITIS 08/07/2006   hands  . PLANTAR FASCIITIS 11/12/2006    Past Surgical History:  Procedure Laterality Date  . COLONOSCOPY  08/2015   [place]-polyps  . KNEE ARTHROSCOPY     right  . TOENAIL EXCISION  10/2019  . TONSILLECTOMY AND ADENOIDECTOMY  1955  . WISDOM TOOTH EXTRACTION      Family History  Problem Relation Age of Onset  . Diabetes  Mother   . Alzheimer's disease Father        died age 44  . Diabetes Sister        4 sisters with diabetes  . Diabetes Brother   . Diabetes Daughter   . Colon cancer Neg Hx   . Esophageal cancer Neg Hx   . Stomach cancer Neg Hx   . Rectal cancer Neg Hx     Social History   Socioeconomic History  . Marital status: Married    Spouse name: Not on file  . Number of children: Not on file  . Years of education: Not on file  . Highest education level: Not on file  Occupational History  . Not on file  Tobacco Use  . Smoking status: Former Smoker    Packs/day: 2.00    Years: 20.00    Pack years: 40.00    Types: Cigarettes    Quit date: 03/10/1987    Years since quitting: 33.3  . Smokeless tobacco: Never Used  Vaping Use  . Vaping Use: Never used  Substance and Sexual Activity  . Alcohol use: Yes    Alcohol/week: 2.0 standard drinks    Types: 2 Standard drinks or equivalent per week  . Drug use: No  . Sexual activity: Not on file  Other Topics Concern  . Not on file  Social History Narrative  . Not on  file   Social Determinants of Health   Financial Resource Strain:   . Difficulty of Paying Living Expenses: Not on file  Food Insecurity:   . Worried About Charity fundraiser in the Last Year: Not on file  . Ran Out of Food in the Last Year: Not on file  Transportation Needs:   . Lack of Transportation (Medical): Not on file  . Lack of Transportation (Non-Medical): Not on file  Physical Activity:   . Days of Exercise per Week: Not on file  . Minutes of Exercise per Session: Not on file  Stress:   . Feeling of Stress : Not on file  Social Connections:   . Frequency of Communication with Friends and Family: Not on file  . Frequency of Social Gatherings with Friends and Family: Not on file  . Attends Religious Services: Not on file  . Active Member of Clubs or Organizations: Not on file  . Attends Archivist Meetings: Not on file  . Marital Status: Not on  file  Intimate Partner Violence:   . Fear of Current or Ex-Partner: Not on file  . Emotionally Abused: Not on file  . Physically Abused: Not on file  . Sexually Abused: Not on file    Outpatient Medications Prior to Visit  Medication Sig Dispense Refill  . aspirin 81 MG tablet Take 81 mg by mouth daily.      Marland Kitchen atorvastatin (LIPITOR) 20 MG tablet TAKE 1 TABLET BY MOUTH DAILY 90 tablet 2  . chlorpheniramine (CHLOR-TRIMETON) 4 MG tablet Take 4 mg by mouth daily.    . empagliflozin (JARDIANCE) 10 MG TABS tablet Take 10 mg by mouth daily. 90 tablet 3  . famotidine (PEPCID) 20 MG tablet Take 20 mg by mouth daily.    . fluticasone (FLONASE) 50 MCG/ACT nasal spray Place into both nostrils daily.    . IRON PO Take 65 mg by mouth once.    Marland Kitchen levocetirizine (XYZAL) 5 MG tablet Take 5 mg by mouth every evening.      Marland Kitchen lisinopril-hydrochlorothiazide (ZESTORETIC) 10-12.5 MG tablet Take 1 tablet by mouth daily. 90 tablet 3  . metFORMIN (GLUCOPHAGE) 1000 MG tablet TAKE 1 TABLET BY MOUTH DAILY WITH BREAKFAST 90 tablet 1  . montelukast (SINGULAIR) 10 MG tablet Take 1 tablet (10 mg total) by mouth at bedtime. 90 tablet 1  . pioglitazone (ACTOS) 15 MG tablet TAKE 1 TABLET BY MOUTH DAILY 90 tablet 1  . Semaglutide,0.25 or 0.5MG /DOS, (OZEMPIC, 0.25 OR 0.5 MG/DOSE,) 2 MG/1.5ML SOPN Inject 0.25 mg into the skin once a week. Start 0.25 mg [place] q week for 4 weeks and then increase to 0.5 mg q week. 4.5 mL 3  . WIXELA INHUB 250-50 MCG/DOSE AEPB INHALE 1 PUFF BY MOUTH TWICE DAILY 60 each 3   No facility-administered medications prior to visit.    Allergies  Allergen Reactions  . Tetracycline     REACTION: rash    ROS Review of Systems  Constitutional: Negative for chills and fever.  Respiratory: Negative for shortness of breath.   Cardiovascular: Negative for chest pain.  Musculoskeletal: Positive for arthralgias.      Objective:    Physical Exam Vitals reviewed.  Constitutional:      Appearance:  Normal appearance.  Cardiovascular:     Rate and Rhythm: Normal rate and regular rhythm.  Pulmonary:     Effort: Pulmonary effort is normal.     Breath sounds: Normal breath sounds.  Musculoskeletal:  Comments: Left hand reveals some mild tenderness CMC joint.  No visible swelling.  No erythema.  He has fairly good range of motion left hip with rotation.  No lateral tenderness.  Straight leg raise are negative.  Neurological:     Mental Status: He is alert.     BP 120/72   Pulse 72   Temp 98.5 F (36.9 C)   Ht 5' 8.75" (1.746 m)   Wt 216 lb 11.2 oz (98.3 kg)   BMI 32.23 kg/m  Wt Readings from Last 3 Encounters:  07/16/20 216 lb 11.2 oz (98.3 kg)  03/14/20 215 lb 1.6 oz (97.6 kg)  11/15/19 223 lb 1.6 oz (101.2 kg)     There are no preventive care reminders to display for this patient.  There are no preventive care reminders to display for this patient.  Lab Results  Component Value Date   TSH 1.60 11/20/2017   Lab Results  Component Value Date   WBC 5.6 11/20/2017   HGB 15.2 11/20/2017   HCT 45.8 11/20/2017   MCV 94.2 11/20/2017   PLT 231.0 11/20/2017   Lab Results  Component Value Date   NA 138 11/15/2019   K 4.5 11/15/2019   CO2 28 11/15/2019   GLUCOSE 176 (H) 11/15/2019   BUN 31 (H) 11/15/2019   CREATININE 1.43 11/15/2019   BILITOT 0.6 11/15/2019   ALKPHOS 92 11/15/2019   AST 15 11/15/2019   ALT 20 11/15/2019   PROT 6.7 11/15/2019   ALBUMIN 4.1 11/15/2019   CALCIUM 9.4 11/15/2019   GFR 48.89 (L) 11/15/2019   Lab Results  Component Value Date   CHOL 149 11/15/2019   Lab Results  Component Value Date   HDL 45.90 11/15/2019   Lab Results  Component Value Date   LDLCALC 68 11/22/2018   Lab Results  Component Value Date   TRIG 210.0 (H) 11/15/2019   Lab Results  Component Value Date   CHOLHDL 3 11/15/2019   Lab Results  Component Value Date   HGBA1C 6.9 (A) 03/14/2020      Assessment & Plan:   #1 type 2 diabetes stable with  A1c 7.1%.  Continue current regimen.  Recheck in 3 to 4 months  #2 hypertension stable and at goal.  No further hypotension after adjusting lisinopril HCTZ dose -Continue current dose of lisinopril HCTZ 10/12.5 mg 1 daily  #3 osteoarthritis left CMC joint -Try over-the-counter diclofenac gel  #4 probable piriformis syndrome -Recommend some piriformis muscle stretches.  Handout given.  If not improving over the next few weeks with this consider sports medicine referral  No orders of the defined types were placed in this encounter.   Follow-up: Return in about 3 months (around 10/16/2020).    Carolann Littler, MD

## 2020-07-16 NOTE — Patient Instructions (Signed)
Piriformis Syndrome Rehab Ask your health care provider which exercises are safe for you. Do exercises exactly as told by your health care provider and adjust them as directed. It is normal to feel mild stretching, pulling, tightness, or discomfort as you do these exercises. Stop right away if you feel sudden pain or your pain gets worse. Do not begin these exercises until told by your health care provider. Stretching and range-of-motion exercises These exercises warm up your muscles and joints and improve the movement and flexibility of your hip and pelvis. The exercises also help to relieve pain, numbness, and tingling. Hip rotation This is an exercise in which you lie on your back and stretch the muscles that rotate your hip (hip rotators) to stretch your buttocks. 1. Lie on your back on a firm surface. 2. Pull your left / right knee toward your same shoulder with your left / right hand until your knee is pointing toward the ceiling. Hold your left / right ankle with your other hand. 3. Keeping your knee steady, gently pull your left / right ankle toward your other shoulder until you feel a stretch in your buttocks. 4. Hold this position for __________ seconds. Repeat __________ times. Complete this exercise __________ times a day. Hip extensor This is an exercise in which you lie on your back and pull your knee to your chest. 1. Lie on your back on a firm surface. Both of your legs should be straight. 2. Pull your left / right knee to your chest. Hold your leg in this position by holding onto the back of your thigh or the front of your knee. 3. Hold this position for __________ seconds. 4. Slowly return to the starting position. Repeat __________ times. Complete this exercise __________ times a day. Strengthening exercises These exercises build strength and endurance in your hip and thigh muscles. Endurance is the ability to use your muscles for a long time, even after they get  tired. Straight leg raises, side-lying This exercise strengthens the muscles that rotate the leg at the hip and move it away from your body (hip abductors). 1. Lie on your side with your left / right leg in the top position. Lie so your head, shoulder, knee, and hip line up. Bend your bottom knee to help you balance. 2. Lift your top leg 4-6 inches (10-15 cm) while keeping your toes pointed straight ahead. 3. Hold this position for __________ seconds. 4. Slowly lower your leg to the starting position. 5. Let your muscles relax completely after each repetition. Repeat __________ times. Complete this exercise __________ times a day. Hip abduction and rotation This is sometimes called quadruped (on hands and knees) exercises. 1. Get on your hands and knees on a firm, lightly padded surface. Your hands should be directly below your shoulders, and your knees should be directly below your hips. 2. Lift your left / right knee out to the side. Keep your knee bent. Do not twist your body. 3. Hold this position for __________ seconds. 4. Slowly lower your leg. Repeat __________ times. Complete this exercise __________ times a day. Straight leg raises, face-down This exercise stretches the muscles that move your hips away from the front of the pelvis (hip extensors). 1. Lie on your abdomen on a bed or a firm surface with a pillow under your hips. 2. Squeeze your buttocks muscles and lift your left / right leg about 4-6 inches (10-15 cm) off the bed. Do not let your back arch. 3. Hold  this position for __________ seconds. 4. Slowly lower your leg to the starting position. 5. Let your muscles relax completely after each repetition. Repeat __________ times. Complete this exercise __________ times a day. This information is not intended to replace advice given to you by your health care provider. Make sure you discuss any questions you have with your health care provider. Document Revised: 01/13/2019  Document Reviewed: 07/15/2018 [employer] Patient Education  2020 [employer]  

## 2020-07-17 ENCOUNTER — Encounter: Payer: Self-pay | Admitting: Family Medicine

## 2020-07-18 DIAGNOSIS — H5213 Myopia, bilateral: Secondary | ICD-10-CM | POA: Diagnosis not present

## 2020-07-18 LAB — HM DIABETES EYE EXAM

## 2020-07-24 ENCOUNTER — Other Ambulatory Visit: Payer: Self-pay

## 2020-07-24 MED ORDER — ATORVASTATIN CALCIUM 20 MG PO TABS: 20.0000 mg | ORAL_TABLET | Freq: Every day | ORAL | 2 refills | Status: DC

## 2020-07-27 ENCOUNTER — Encounter: Payer: Self-pay | Admitting: Family Medicine

## 2020-09-01 ENCOUNTER — Other Ambulatory Visit: Payer: Self-pay | Admitting: Family Medicine

## 2020-10-17 ENCOUNTER — Other Ambulatory Visit: Payer: Self-pay

## 2020-10-17 ENCOUNTER — Ambulatory Visit (INDEPENDENT_AMBULATORY_CARE_PROVIDER_SITE_OTHER): Payer: Medicare Other | Admitting: Family Medicine

## 2020-10-17 ENCOUNTER — Encounter: Payer: Self-pay | Admitting: Family Medicine

## 2020-10-17 VITALS — BP 132/70 | HR 73 | Temp 97.5°F | Ht 68.0 in | Wt 218.4 lb

## 2020-10-17 DIAGNOSIS — H6121 Impacted cerumen, right ear: Secondary | ICD-10-CM

## 2020-10-17 NOTE — Progress Notes (Signed)
Established Patient Office Visit  Subjective:  Patient ID: Richard Dalton, male    DOB: Oct 12, 1950  Age: 70 y.o. MRN: [id number]  CC:  Chief Complaint  Patient presents with  . Cerumen Impaction    Right ear feels clogged x 4 days, little pain today, noticed some blood few days ago.     HPI Richard Dalton presents for acute issue of right ear feeling "clogged ".  First noted about 4 days ago.  He applied some hydrogen peroxide and used a Q-tip and got out some wax.  He did little bleeding afterwards.  No significant pain at this time.  No major hearing loss.  No dizziness.  No left ear symptoms.  Past Medical History:  Diagnosis Date  . ALLERGIC RHINITIS 11/12/2006  . Allergy   . Asthma 2009   patient denies this dx  . DIABETES MELLITUS, TYPE II 08/07/2006  . ECZEMA 11/08/2008  . ERECTILE DYSFUNCTION 11/12/2006  . GENERALIZED VACCINIA AS COMP MEDICAL CARE NEC 09/09/2010  . GERD (gastroesophageal reflux disease)   . HYPERLIPIDEMIA 08/07/2006  . HYPERTENSION 08/07/2006  . Neoplasm of uncertain behavior of skin 02/07/2009  . OSTEOARTHRITIS 08/07/2006   hands  . PLANTAR FASCIITIS 11/12/2006    Past Surgical History:  Procedure Laterality Date  . COLONOSCOPY  08/2015   [place]-polyps  . KNEE ARTHROSCOPY     right  . TOENAIL EXCISION  10/2019  . TONSILLECTOMY AND ADENOIDECTOMY  1955  . WISDOM TOOTH EXTRACTION      Family History  Problem Relation Age of Onset  . Diabetes Mother   . Alzheimer's disease Father        died age 105  . Diabetes Sister        4 sisters with diabetes  . Diabetes Brother   . Diabetes Daughter   . Colon cancer Neg Hx   . Esophageal cancer Neg Hx   . Stomach cancer Neg Hx   . Rectal cancer Neg Hx     Social History   Socioeconomic History  . Marital status: Married    Spouse name: Not on file  . Number of children: Not on file  . Years of education: Not on file  . Highest education level: Not on file  Occupational History  . Not on file  Tobacco  Use  . Smoking status: Former Smoker    Packs/day: 2.00    Years: 20.00    Pack years: 40.00    Types: Cigarettes    Quit date: 03/10/1987    Years since quitting: 33.6  . Smokeless tobacco: Never Used  Vaping Use  . Vaping Use: Never used  Substance and Sexual Activity  . Alcohol use: Yes    Alcohol/week: 2.0 standard drinks    Types: 2 Standard drinks or equivalent per week  . Drug use: No  . Sexual activity: Not on file  Other Topics Concern  . Not on file  Social History Narrative  . Not on file   Social Determinants of Health   Financial Resource Strain: Not on file  Food Insecurity: Not on file  Transportation Needs: Not on file  Physical Activity: Not on file  Stress: Not on file  Social Connections: Not on file  Intimate Partner Violence: Not on file    Outpatient Medications Prior to Visit  Medication Sig Dispense Refill  . atorvastatin (LIPITOR) 20 MG tablet Take 1 tablet (20 mg total) by mouth daily. 90 tablet 2  . chlorpheniramine (CHLOR-TRIMETON) 4 MG tablet  Take 4 mg by mouth daily.    . empagliflozin (JARDIANCE) 10 MG TABS tablet Take 10 mg by mouth daily. 90 tablet 3  . famotidine (PEPCID) 20 MG tablet Take 20 mg by mouth daily.    . fluticasone (FLONASE) 50 MCG/ACT nasal spray Place into both nostrils daily.    . Fluticasone-Salmeterol (ADVAIR DISKUS) 250-50 MCG/DOSE AEPB Inhale 1 puff into the lungs 2 (two) times daily.    . IRON PO Take 65 mg by mouth once.    Marland Kitchen levocetirizine (XYZAL) 5 MG tablet Take 5 mg by mouth every evening.    Marland Kitchen lisinopril-hydrochlorothiazide (ZESTORETIC) 10-12.5 MG tablet Take 1 tablet by mouth daily. 90 tablet 3  . metFORMIN (GLUCOPHAGE) 1000 MG tablet TAKE 1 TABLET BY MOUTH DAILY WITH BREAKFAST 90 tablet 1  . montelukast (SINGULAIR) 10 MG tablet Take 1 tablet (10 mg total) by mouth at bedtime. 90 tablet 1  . pioglitazone (ACTOS) 15 MG tablet TAKE 1 TABLET BY MOUTH DAILY 90 tablet 1  . Semaglutide,0.25 or 0.5MG /DOS, (OZEMPIC,  0.25 OR 0.5 MG/DOSE,) 2 MG/1.5ML SOPN Inject 0.25 mg into the skin once a week. Start 0.25 mg [place] q week for 4 weeks and then increase to 0.5 mg q week. 4.5 mL 3  . WIXELA INHUB 250-50 MCG/DOSE AEPB INHALE 1 PUFF BY MOUTH TWICE DAILY 60 each 3  . aspirin 81 MG tablet Take 81 mg by mouth daily.   (Patient not taking: Reported on 10/17/2020)     No facility-administered medications prior to visit.    Allergies  Allergen Reactions  . Tetracycline     REACTION: rash    ROS Review of Systems  Constitutional: Negative for chills and fever.  HENT: Negative for congestion, ear discharge and hearing loss.       Objective:    Physical Exam Vitals reviewed.  Constitutional:      Appearance: Normal appearance.  HENT:     Ears:     Comments: Left ear canal is clear.  Eardrum is normal.  Right canal is mostly obscured by wax with impaction that covers about 80% of the canal.  No visible dried blood Neurological:     Mental Status: He is alert.     BP 132/70   Pulse 73   Temp (!) 97.5 F (36.4 C) (Temporal)   Ht 5\' 8"  (1.727 m)   Wt 218 lb 6.4 oz (99.1 kg)   SpO2 99%   BMI 33.21 kg/m  Wt Readings from Last 3 Encounters:  10/17/20 218 lb 6.4 oz (99.1 kg)  07/16/20 216 lb 11.2 oz (98.3 kg)  03/14/20 215 lb 1.6 oz (97.6 kg)     There are no preventive care reminders to display for this patient.  There are no preventive care reminders to display for this patient.  Lab Results  Component Value Date   TSH 1.60 11/20/2017   Lab Results  Component Value Date   WBC 5.6 11/20/2017   HGB 15.2 11/20/2017   HCT 45.8 11/20/2017   MCV 94.2 11/20/2017   PLT 231.0 11/20/2017   Lab Results  Component Value Date   NA 138 11/15/2019   K 4.5 11/15/2019   CO2 28 11/15/2019   GLUCOSE 176 (H) 11/15/2019   BUN 31 (H) 11/15/2019   CREATININE 1.43 11/15/2019   BILITOT 0.6 11/15/2019   ALKPHOS 92 11/15/2019   AST 15 11/15/2019   ALT 20 11/15/2019   PROT 6.7 11/15/2019   ALBUMIN 4.1  11/15/2019  CALCIUM 9.4 11/15/2019   GFR 48.89 (L) 11/15/2019   Lab Results  Component Value Date   CHOL 149 11/15/2019   Lab Results  Component Value Date   HDL 45.90 11/15/2019   Lab Results  Component Value Date   LDLCALC 68 11/22/2018   Lab Results  Component Value Date   TRIG 210.0 (H) 11/15/2019   Lab Results  Component Value Date   CHOLHDL 3 11/15/2019   Lab Results  Component Value Date   HGBA1C 7.1 (A) 07/16/2020      Assessment & Plan:   Cerumen impaction right ear canal.  Patient was able to partially removed by irrigation at home.  Discussed-risk and benefits of irrigation for wax removal including risk of pain, bleeding, low risk of perforation.  Patient consented.  Nurse was able to irrigate and some cerumen removed Patient states ear feels less muffled at this time.   No orders of the defined types were placed in this encounter.   Follow-up: No follow-ups on file.    Carolann Littler, MD

## 2020-10-17 NOTE — Patient Instructions (Signed)
Consider use of OTC Debrox or cerumenex and leave in about 30 minutes and then rinse out.

## 2020-10-26 ENCOUNTER — Other Ambulatory Visit: Payer: Self-pay | Admitting: Family Medicine

## 2020-10-30 ENCOUNTER — Other Ambulatory Visit: Payer: Self-pay

## 2020-10-30 ENCOUNTER — Ambulatory Visit (INDEPENDENT_AMBULATORY_CARE_PROVIDER_SITE_OTHER): Payer: Medicare Other | Admitting: Podiatry

## 2020-10-30 DIAGNOSIS — L603 Nail dystrophy: Secondary | ICD-10-CM

## 2020-10-30 DIAGNOSIS — E119 Type 2 diabetes mellitus without complications: Secondary | ICD-10-CM | POA: Diagnosis not present

## 2020-10-30 NOTE — Progress Notes (Signed)
  Subjective:  Patient ID: Richard Dalton, male    DOB: 01/25/1951,  MRN: [id number]  Chief Complaint  Patient presents with  . foot exam    F/U annual foot exam -FBS: unkown A1C: 7.1 PCP: [facility] x Oct   69 y.o. male presents with the above complaint. History confirmed with patient.   Objective:  Physical Exam: warm, good capillary refill, nail exam normal nails without lesions, no trophic changes or ulcerative lesions. DP pulses palpable, PT pulses palpable and protective sensation intact Left Foot: normal exam, no swelling, tenderness, instability; ligaments intact, full range of motion of all ankle/foot joints  Right Foot: normal exam, no swelling, tenderness, instability; ligaments intact, full range of motion of all ankle/foot joints   No images are attached to the encounter.  Assessment:   1. Encounter for diabetic foot exam ([place])   2. Nail dystrophy    Plan:  Patient was evaluated and treated and all questions answered.  Diabetes -Diabetic without complication. Risk level 0 -Courtesy nail debridement today. -F/u annually  Return in about 1 year (around 10/30/2021) for Annual foot exam.

## 2020-11-13 ENCOUNTER — Encounter: Payer: Self-pay | Admitting: Family Medicine

## 2020-11-13 ENCOUNTER — Ambulatory Visit (INDEPENDENT_AMBULATORY_CARE_PROVIDER_SITE_OTHER): Payer: Medicare Other | Admitting: Family Medicine

## 2020-11-13 ENCOUNTER — Other Ambulatory Visit: Payer: Self-pay

## 2020-11-13 VITALS — BP 110/70 | HR 97 | Temp 98.2°F | Ht 68.0 in | Wt 219.8 lb

## 2020-11-13 DIAGNOSIS — I10 Essential (primary) hypertension: Secondary | ICD-10-CM

## 2020-11-13 DIAGNOSIS — E785 Hyperlipidemia, unspecified: Secondary | ICD-10-CM

## 2020-11-13 DIAGNOSIS — E1165 Type 2 diabetes mellitus with hyperglycemia: Secondary | ICD-10-CM | POA: Diagnosis not present

## 2020-11-13 LAB — LIPID PANEL
Cholesterol: 160 mg/dL (ref 0–200)
HDL: 50.3 mg/dL (ref >39.00)
LDL Cholesterol: 76 mg/dL (ref 0–99)
NonHDL: 109.25
Total CHOL/HDL Ratio: 3
Triglycerides: 166 mg/dL — ABNORMAL HIGH (ref 0.0–149.0)
VLDL: 33.2 mg/dL (ref 0.0–40.0)

## 2020-11-13 LAB — BASIC METABOLIC PANEL
BUN: 24 mg/dL — ABNORMAL HIGH (ref 6–23)
CO2: 35 mEq/L — ABNORMAL HIGH (ref 19–32)
Calcium: 9.4 mg/dL (ref 8.4–10.5)
Chloride: 99 mEq/L (ref 96–112)
Creatinine, Ser: 1.52 mg/dL — ABNORMAL HIGH (ref 0.40–1.50)
GFR: 45.97 mL/min — ABNORMAL LOW (ref >60.00)
Glucose, Bld: 132 mg/dL — ABNORMAL HIGH (ref 70–99)
Potassium: 4.2 mEq/L (ref 3.5–5.1)
Sodium: 139 mEq/L (ref 135–145)

## 2020-11-13 LAB — HEPATIC FUNCTION PANEL
ALT: 20 U/L (ref 0–53)
AST: 17 U/L (ref 0–37)
Albumin: 4.2 g/dL (ref 3.5–5.2)
Alkaline Phosphatase: 92 U/L (ref 39–117)
Bilirubin, Direct: 0.1 mg/dL (ref 0.0–0.3)
Total Bilirubin: 0.6 mg/dL (ref 0.2–1.2)
Total Protein: 6.7 g/dL (ref 6.0–8.3)

## 2020-11-13 LAB — POCT GLYCOSYLATED HEMOGLOBIN (HGB A1C): Hemoglobin A1C: 7.3 % — AB (ref 4.0–5.6)

## 2020-11-13 MED ORDER — OZEMPIC (0.25 OR 0.5 MG/DOSE) 2 MG/1.5ML ~~LOC~~ SOPN: 0.5000 mg | PEN_INJECTOR | SUBCUTANEOUS | 3 refills | Status: DC

## 2020-11-13 MED ORDER — EMPAGLIFLOZIN 10 MG PO TABS
10.0000 mg | ORAL_TABLET | Freq: Every day | ORAL | 3 refills | Status: DC
Start: 2020-11-13 — End: 2020-11-23

## 2020-11-13 NOTE — Progress Notes (Signed)
Established Patient Office Visit  Subjective:  Patient ID: Richard Dalton, male    DOB: September 01, 1951  Age: 70 y.o. MRN: [id number]  CC:  Chief Complaint  Patient presents with  . Follow-up    HPI Richard Dalton presents for medical follow-up.  He has type 2 diabetes, obesity, hyperlipidemia, hypertension, chronic kidney disease.  He is requesting refills of Jardiance and Ozempic.  Blood sugars relatively stable.  Last A1c 7.1%.  No polyuria or polydipsia.  Blood pressures been well controlled.  No dizziness.  No recent chest pains.  Compliant with medications.  Due for follow-up lipids and chemistries.  Generally doing well.  He plans to go with his family down to [facility] and couple weeks for the [place].  He sees podiatrist regularly and also gets regular eye exams.  Immunizations up-to-date.  Wt Readings from Last 3 Encounters:  11/13/20 219 lb 12.8 oz (99.7 kg)  10/17/20 218 lb 6.4 oz (99.1 kg)  07/16/20 216 lb 11.2 oz (98.3 kg)     Past Medical History:  Diagnosis Date  . ALLERGIC RHINITIS 11/12/2006  . Allergy   . Asthma 2009   patient denies this dx  . DIABETES MELLITUS, TYPE II 08/07/2006  . ECZEMA 11/08/2008  . ERECTILE DYSFUNCTION 11/12/2006  . GENERALIZED VACCINIA AS COMP MEDICAL CARE NEC 09/09/2010  . GERD (gastroesophageal reflux disease)   . HYPERLIPIDEMIA 08/07/2006  . HYPERTENSION 08/07/2006  . Neoplasm of uncertain behavior of skin 02/07/2009  . OSTEOARTHRITIS 08/07/2006   hands  . PLANTAR FASCIITIS 11/12/2006    Past Surgical History:  Procedure Laterality Date  . COLONOSCOPY  08/2015   [place]-polyps  . KNEE ARTHROSCOPY     right  . TOENAIL EXCISION  10/2019  . TONSILLECTOMY AND ADENOIDECTOMY  1955  . WISDOM TOOTH EXTRACTION      Family History  Problem Relation Age of Onset  . Diabetes Mother   . Alzheimer's disease Father        died age 41  . Diabetes Sister        4 sisters with diabetes  . Diabetes Brother   . Diabetes Daughter   . Colon  cancer Neg Hx   . Esophageal cancer Neg Hx   . Stomach cancer Neg Hx   . Rectal cancer Neg Hx     Social History   Socioeconomic History  . Marital status: Married    Spouse name: Not on file  . Number of children: Not on file  . Years of education: Not on file  . Highest education level: Not on file  Occupational History  . Not on file  Tobacco Use  . Smoking status: Former Smoker    Packs/day: 2.00    Years: 20.00    Pack years: 40.00    Types: Cigarettes    Quit date: 03/10/1987    Years since quitting: 33.7  . Smokeless tobacco: Never Used  Vaping Use  . Vaping Use: Never used  Substance and Sexual Activity  . Alcohol use: Yes    Alcohol/week: 2.0 standard drinks    Types: 2 Standard drinks or equivalent per week  . Drug use: No  . Sexual activity: Not on file  Other Topics Concern  . Not on file  Social History Narrative  . Not on file   Social Determinants of Health   Financial Resource Strain: Not on file  Food Insecurity: Not on file  Transportation Needs: Not on file  Physical Activity: Not on file  Stress: Not on file  Social Connections: Not on file  Intimate Partner Violence: Not on file    Outpatient Medications Prior to Visit  Medication Sig Dispense Refill  . atorvastatin (LIPITOR) 20 MG tablet Take 1 tablet (20 mg total) by mouth daily. 90 tablet 2  . chlorpheniramine (CHLOR-TRIMETON) 4 MG tablet Take 4 mg by mouth daily.    . famotidine (PEPCID) 20 MG tablet Take 20 mg by mouth daily.    . fluticasone (FLONASE) 50 MCG/ACT nasal spray Place into both nostrils daily.    . Fluticasone-Salmeterol (ADVAIR) 250-50 MCG/DOSE AEPB Inhale 1 puff into the lungs 2 (two) times daily.    . IRON PO Take 65 mg by mouth once.    Marland Kitchen levocetirizine (XYZAL) 5 MG tablet Take 5 mg by mouth every evening.    Marland Kitchen lisinopril-hydrochlorothiazide (ZESTORETIC) 10-12.5 MG tablet Take 1 tablet by mouth daily. 90 tablet 3  . metFORMIN (GLUCOPHAGE) 1000 MG tablet TAKE 1 TABLET  BY MOUTH DAILY WITH BREAKFAST 90 tablet 1  . montelukast (SINGULAIR) 10 MG tablet TAKE 1 TABLET(10 MG) BY MOUTH AT BEDTIME 90 tablet 1  . pioglitazone (ACTOS) 15 MG tablet TAKE 1 TABLET BY MOUTH DAILY 90 tablet 1  . empagliflozin (JARDIANCE) 10 MG TABS tablet Take 10 mg by mouth daily. 90 tablet 3  . Semaglutide,0.25 or 0.5MG /DOS, (OZEMPIC, 0.25 OR 0.5 MG/DOSE,) 2 MG/1.5ML SOPN Inject 0.25 mg into the skin once a week. Start 0.25 mg [place] q week for 4 weeks and then increase to 0.5 mg q week. 4.5 mL 3   No facility-administered medications prior to visit.    Allergies  Allergen Reactions  . Tetracycline     REACTION: rash    ROS Review of Systems  Constitutional: Negative for fatigue and unexpected weight change.  Eyes: Negative for visual disturbance.  Respiratory: Negative for cough, chest tightness and shortness of breath.   Cardiovascular: Negative for chest pain, palpitations and leg swelling.  Endocrine: Negative for polydipsia and polyuria.  Neurological: Negative for dizziness, syncope, weakness, light-headedness and headaches.      Objective:    Physical Exam Constitutional:      Appearance: He is well-developed and well-nourished.  HENT:     Right Ear: External ear normal.     Left Ear: External ear normal.     Mouth/Throat:     Mouth: Oropharynx is clear and moist.  Eyes:     Pupils: Pupils are equal, round, and reactive to light.  Neck:     Thyroid: No thyromegaly.  Cardiovascular:     Rate and Rhythm: Normal rate and regular rhythm.  Pulmonary:     Effort: Pulmonary effort is normal. No respiratory distress.     Breath sounds: Normal breath sounds. No wheezing or rales.  Musculoskeletal:        General: No edema.     Cervical back: Neck supple.     Right lower leg: No edema.     Left lower leg: No edema.  Neurological:     Mental Status: He is alert and oriented to person, place, and time.     BP 110/70 (BP Location: Left Arm, Patient Position:  Sitting, Cuff Size: Large)   Pulse 97   Temp 98.2 F (36.8 C) (Oral)   Ht 5\' 8"  (1.727 m)   Wt 219 lb 12.8 oz (99.7 kg)   BMI 33.42 kg/m  Wt Readings from Last 3 Encounters:  11/13/20 219 lb 12.8 oz (99.7 kg)  10/17/20  218 lb 6.4 oz (99.1 kg)  07/16/20 216 lb 11.2 oz (98.3 kg)     There are no preventive care reminders to display for this patient.  There are no preventive care reminders to display for this patient.  Lab Results  Component Value Date   TSH 1.60 11/20/2017   Lab Results  Component Value Date   WBC 5.6 11/20/2017   HGB 15.2 11/20/2017   HCT 45.8 11/20/2017   MCV 94.2 11/20/2017   PLT 231.0 11/20/2017   Lab Results  Component Value Date   NA 138 11/15/2019   K 4.5 11/15/2019   CO2 28 11/15/2019   GLUCOSE 176 (H) 11/15/2019   BUN 31 (H) 11/15/2019   CREATININE 1.43 11/15/2019   BILITOT 0.6 11/15/2019   ALKPHOS 92 11/15/2019   AST 15 11/15/2019   ALT 20 11/15/2019   PROT 6.7 11/15/2019   ALBUMIN 4.1 11/15/2019   CALCIUM 9.4 11/15/2019   GFR 48.89 (L) 11/15/2019   Lab Results  Component Value Date   CHOL 149 11/15/2019   Lab Results  Component Value Date   HDL 45.90 11/15/2019   Lab Results  Component Value Date   LDLCALC 68 11/22/2018   Lab Results  Component Value Date   TRIG 210.0 (H) 11/15/2019   Lab Results  Component Value Date   CHOLHDL 3 11/15/2019   Lab Results  Component Value Date   HGBA1C 7.3 (A) 11/13/2020      Assessment & Plan:   Problem List Items Addressed This Visit      Unprioritized   Essential hypertension - Primary   Relevant Orders   Basic metabolic panel   Hyperlipidemia   Relevant Orders   Lipid panel   Hepatic function panel   Type 2 diabetes mellitus with hyperglycemia (HCC)   Relevant Medications   empagliflozin (JARDIANCE) 10 MG TABS tablet   Semaglutide,0.25 or 0.5MG /DOS, (OZEMPIC, 0.25 OR 0.5 MG/DOSE,) 2 MG/1.5ML SOPN   Other Relevant Orders   POC HgB A1c (Completed)    -Refill  Jardiance and Ozempic for 1 year -Check other labs as above including lipid panel, hepatic panel, basic metabolic panel -Continue yearly eye exam -Routine follow-up in 3 months and sooner as needed  Meds ordered this encounter  Medications  . empagliflozin (JARDIANCE) 10 MG TABS tablet    Sig: Take 1 tablet (10 mg total) by mouth daily.    Dispense:  90 tablet    Refill:  3  . Semaglutide,0.25 or 0.5MG /DOS, (OZEMPIC, 0.25 OR 0.5 MG/DOSE,) 2 MG/1.5ML SOPN    Sig: Inject 0.5 mg into the skin once a week. Start 0.25 mg [place] q week for 4 weeks and then increase to 0.5 mg q week.    Dispense:  4.5 mL    Refill:  3    Follow-up: Return in about 3 months (around 02/10/2021).    Carolann Littler, MD

## 2020-11-14 ENCOUNTER — Telehealth: Payer: Self-pay

## 2020-11-14 DIAGNOSIS — N1831 Chronic kidney disease, stage 3a: Secondary | ICD-10-CM

## 2020-11-14 NOTE — Telephone Encounter (Signed)
Informed patient of results.  Referral placed. 

## 2020-11-14 NOTE — Telephone Encounter (Signed)
-----   Message from [name], MD sent at 11/14/2020  7:46 AM EST ----- Liver panel is normal.  Lipids are stable.  His kidney function has deteriorated slightly compared with values last year.  I do recommend setting up nephrology consult-even though his deterioration in kidney functions been very gradual and slow over the past 7 or 8 years.

## 2020-11-23 ENCOUNTER — Other Ambulatory Visit: Payer: Self-pay | Admitting: Family Medicine

## 2020-12-04 ENCOUNTER — Other Ambulatory Visit: Payer: Self-pay | Admitting: Family Medicine

## 2020-12-11 ENCOUNTER — Other Ambulatory Visit: Payer: Self-pay

## 2020-12-11 NOTE — Telephone Encounter (Signed)
JoAnne spoke with Apolonio Schneiders at [facility] in an attempt to do a prior authorization for Wixela 250/50. Per Apolonio Schneiders the prescription was covered by the patients primary insurance at a cost of $92.51 vs $314 so no prior authorization is required. Fax sent to be scanned with note attached.

## 2020-12-12 DIAGNOSIS — N1831 Chronic kidney disease, stage 3a: Secondary | ICD-10-CM | POA: Diagnosis not present

## 2020-12-12 DIAGNOSIS — E1122 Type 2 diabetes mellitus with diabetic chronic kidney disease: Secondary | ICD-10-CM | POA: Diagnosis not present

## 2020-12-12 DIAGNOSIS — I129 Hypertensive chronic kidney disease with stage 1 through stage 4 chronic kidney disease, or unspecified chronic kidney disease: Secondary | ICD-10-CM | POA: Diagnosis not present

## 2020-12-12 DIAGNOSIS — E785 Hyperlipidemia, unspecified: Secondary | ICD-10-CM | POA: Diagnosis not present

## 2020-12-19 ENCOUNTER — Other Ambulatory Visit: Payer: Self-pay | Admitting: Nephrology

## 2020-12-19 DIAGNOSIS — N1831 Chronic kidney disease, stage 3a: Secondary | ICD-10-CM

## 2020-12-23 ENCOUNTER — Other Ambulatory Visit: Payer: Self-pay | Admitting: Family Medicine

## 2020-12-31 ENCOUNTER — Ambulatory Visit
Admission: RE | Admit: 2020-12-31 | Discharge: 2020-12-31 | Disposition: A | Discharge status: Home / Self Care | Payer: Medicare Other | Source: Ambulatory Visit | Attending: Nephrology | Admitting: Nephrology

## 2020-12-31 DIAGNOSIS — N1831 Chronic kidney disease, stage 3a: Secondary | ICD-10-CM | POA: Diagnosis not present

## 2021-01-29 ENCOUNTER — Other Ambulatory Visit: Payer: Self-pay

## 2021-01-29 ENCOUNTER — Ambulatory Visit (INDEPENDENT_AMBULATORY_CARE_PROVIDER_SITE_OTHER): Payer: Medicare Other

## 2021-01-29 VITALS — BP 118/60 | HR 97 | Temp 98.1°F | Resp 20 | Wt 216.5 lb

## 2021-01-29 DIAGNOSIS — Z0000 Encounter for general adult medical examination without abnormal findings: Secondary | ICD-10-CM

## 2021-01-29 NOTE — Patient Instructions (Addendum)
Mr. Richard Dalton , Thank you for taking time to come for your Medicare Wellness Visit. I appreciate your ongoing commitment to your health goals. Please review the following plan we discussed and let me know if I can assist you in the future.   Screening recommendations/referrals: Colonoscopy: Done 11/18/18 Recommended yearly ophthalmology/optometry visit for glaucoma screening and checkup Recommended yearly dental visit for hygiene and checkup  Vaccinations: Influenza vaccine: Up to date Pneumococcal vaccine: Up to date Tdap vaccine: Up to date Shingles vaccine: Shingrix discussed. Please contact your pharmacy for coverage information.    Covid-19: Completed 1/6, 2/3, 8/15, & 07/18/20  Advanced directives: Copies in chart  Conditions/risks identified: None at this time   Next appointment: Follow up in one year for your annual wellness visit.   Preventive Care 3 Years and Older, Male Preventive care refers to lifestyle choices and visits with your health care provider that can promote health and wellness. What does preventive care include?  A yearly physical exam. This is also called an annual well check.  Dental exams once or twice a year.  Routine eye exams. Ask your health care provider how often you should have your eyes checked.  Personal lifestyle choices, including:  Daily care of your teeth and gums.  Regular physical activity.  Eating a healthy diet.  Avoiding tobacco and drug use.  Limiting alcohol use.  Practicing safe sex.  Taking low doses of aspirin every day.  Taking vitamin and mineral supplements as recommended by your health care provider. What happens during an annual well check? The services and screenings done by your health care provider during your annual well check will depend on your age, overall health, lifestyle risk factors, and family history of disease. Counseling  Your health care provider may ask you questions about your:  Alcohol  use.  Tobacco use.  Drug use.  Emotional well-being.  Home and relationship well-being.  Sexual activity.  Eating habits.  History of falls.  Memory and ability to understand (cognition).  Work and work Statistician. Screening  You may have the following tests or measurements:  Height, weight, and BMI.  Blood pressure.  Lipid and cholesterol levels. These may be checked every 5 years, or more frequently if you are over 9 years old.  Skin check.  Lung cancer screening. You may have this screening every year starting at age 30 if you have a 30-pack-year history of smoking and currently smoke or have quit within the past 15 years.  Fecal occult blood test (FOBT) of the stool. You may have this test every year starting at age 54.  Flexible sigmoidoscopy or colonoscopy. You may have a sigmoidoscopy every 5 years or a colonoscopy every 10 years starting at age 28.  Prostate cancer screening. Recommendations will vary depending on your family history and other risks.  Hepatitis C blood test.  Hepatitis B blood test.  Sexually transmitted disease (STD) testing.  Diabetes screening. This is done by checking your blood sugar (glucose) after you have not eaten for a while (fasting). You may have this done every 1-3 years.  Abdominal aortic aneurysm (AAA) screening. You may need this if you are a current or former smoker.  Osteoporosis. You may be screened starting at age 72 if you are at high risk. Talk with your health care provider about your test results, treatment options, and if necessary, the need for more tests. Vaccines  Your health care provider may recommend certain vaccines, such as:  Influenza vaccine. This is  recommended every year.  Tetanus, diphtheria, and acellular pertussis (Tdap, Td) vaccine. You may need a Td booster every 10 years.  Zoster vaccine. You may need this after age 42.  Pneumococcal 13-valent conjugate (PCV13) vaccine. One dose is  recommended after age 42.  Pneumococcal polysaccharide (PPSV23) vaccine. One dose is recommended after age 57. Talk to your health care provider about which screenings and vaccines you need and how often you need them. This information is not intended to replace advice given to you by your health care provider. Make sure you discuss any questions you have with your health care provider. Document Released: 10/19/2015 Document Revised: 06/11/2016 Document Reviewed: 07/24/2015 Elsevier Interactive Patient Education  2017 [place] Prevention in the Home Falls can cause injuries. They can happen to people of all ages. There are many things you can do to make your home safe and to help prevent falls. What can I do on the outside of my home?  Regularly fix the edges of walkways and driveways and fix any cracks.  Remove anything that might make you trip as you walk through a door, such as a raised step or threshold.  Trim any bushes or trees on the path to your home.  Use bright outdoor lighting.  Clear any walking paths of anything that might make someone trip, such as rocks or tools.  Regularly check to see if handrails are loose or broken. Make sure that both sides of any steps have handrails.  Any raised decks and porches should have guardrails on the edges.  Have any leaves, snow, or ice cleared regularly.  Use sand or salt on walking paths during winter.  Clean up any spills in your garage right away. This includes oil or grease spills. What can I do in the bathroom?  Use night lights.  Install grab bars by the toilet and in the tub and shower. Do not use towel bars as grab bars.  Use non-skid mats or decals in the tub or shower.  If you need to sit down in the shower, use a plastic, non-slip stool.  Keep the floor dry. Clean up any water that spills on the floor as soon as it happens.  Remove soap buildup in the tub or shower regularly.  Attach bath mats  securely with double-sided non-slip rug tape.  Do not have throw rugs and other things on the floor that can make you trip. What can I do in the bedroom?  Use night lights.  Make sure that you have a light by your bed that is easy to reach.  Do not use any sheets or blankets that are too big for your bed. They should not hang down onto the floor.  Have a firm chair that has side arms. You can use this for support while you get dressed.  Do not have throw rugs and other things on the floor that can make you trip. What can I do in the kitchen?  Clean up any spills right away.  Avoid walking on wet floors.  Keep items that you use a lot in easy-to-reach places.  If you need to reach something above you, use a strong step stool that has a grab bar.  Keep electrical cords out of the way.  Do not use floor polish or wax that makes floors slippery. If you must use wax, use non-skid floor wax.  Do not have throw rugs and other things on the floor that can make you trip.  What can I do with my stairs?  Do not leave any items on the stairs.  Make sure that there are handrails on both sides of the stairs and use them. Fix handrails that are broken or loose. Make sure that handrails are as long as the stairways.  Check any carpeting to make sure that it is firmly attached to the stairs. Fix any carpet that is loose or worn.  Avoid having throw rugs at the top or bottom of the stairs. If you do have throw rugs, attach them to the floor with carpet tape.  Make sure that you have a light switch at the top of the stairs and the bottom of the stairs. If you do not have them, ask someone to add them for you. What else can I do to help prevent falls?  Wear shoes that:  Do not have high heels.  Have rubber bottoms.  Are comfortable and fit you well.  Are closed at the toe. Do not wear sandals.  If you use a stepladder:  Make sure that it is fully opened. Do not climb a closed  stepladder.  Make sure that both sides of the stepladder are locked into place.  Ask someone to hold it for you, if possible.  Clearly mark and make sure that you can see:  Any grab bars or handrails.  First and last steps.  Where the edge of each step is.  Use tools that help you move around (mobility aids) if they are needed. These include:  Canes.  Walkers.  Scooters.  Crutches.  Turn on the lights when you go into a dark area. Replace any light bulbs as soon as they burn out.  Set up your furniture so you have a clear path. Avoid moving your furniture around.  If any of your floors are uneven, fix them.  If there are any pets around you, be aware of where they are.  Review your medicines with your doctor. Some medicines can make you feel dizzy. This can increase your chance of falling. Ask your doctor what other things that you can do to help prevent falls. This information is not intended to replace advice given to you by your health care provider. Make sure you discuss any questions you have with your health care provider. Document Released: 07/19/2009 Document Revised: 02/28/2016 Document Reviewed: 10/27/2014 Elsevier Interactive Patient Education  2017 [employer].

## 2021-01-29 NOTE — Progress Notes (Signed)
Subjective:   Kyson Kupper is a 70 y.o. male who presents for an Initial Medicare Annual Wellness Visit.  Review of Systems     Cardiac Risk Factors include: advanced age (>66men, >63 women);diabetes mellitus;dyslipidemia;male gender;hypertension;obesity (BMI >30kg/m2)     Objective:    Today's Vitals   01/29/21 0808  BP: 118/60  Pulse: 97  Resp: 20  Temp: 98.1 F (36.7 C)  SpO2: 98%  Weight: 216 lb 8 oz (98.2 kg)   Body mass index is 32.92 kg/m.  Advanced Directives 01/29/2021 08/21/2015 08/07/2015  Does Patient Have a Medical Advance Directive? Yes Yes No  Type of Advance Directive Healthcare Power of [place] in Chart? Yes - validated most recent copy scanned in chart (See row information) - -  Would patient like information on creating a medical advance directive? - - No - patient declined information    Current Medications (verified) Outpatient Encounter Medications as of 01/29/2021  Medication Sig  . ADVAIR DISKUS 250-50 MCG/DOSE AEPB INHALE 1 PUFF BY MOUTH TWICE DAILY  . atorvastatin (LIPITOR) 20 MG tablet TAKE 1 TABLET BY MOUTH DAILY  . chlorpheniramine (CHLOR-TRIMETON) 4 MG tablet Take 4 mg by mouth every 8 (eight) hours.  . famotidine (PEPCID) 20 MG tablet Take 20 mg by mouth daily.  . fluticasone (FLONASE) 50 MCG/ACT nasal spray Place into both nostrils daily.  . IRON PO Take 65 mg by mouth once.  Marland Kitchen JARDIANCE 10 MG TABS tablet TAKE 1 TABLET BY MOUTH DAILY  . levocetirizine (XYZAL) 5 MG tablet Take 5 mg by mouth every evening.  Marland Kitchen lisinopril-hydrochlorothiazide (ZESTORETIC) 10-12.5 MG tablet TAKE 1 TABLET BY MOUTH DAILY  . metFORMIN (GLUCOPHAGE) 1000 MG tablet TAKE 1 TABLET BY MOUTH DAILY WITH BREAKFAST  . montelukast (SINGULAIR) 10 MG tablet TAKE 1 TABLET(10 MG) BY MOUTH AT BEDTIME  . pioglitazone (ACTOS) 15 MG tablet TAKE 1 TABLET BY MOUTH DAILY  . Semaglutide,0.25 or 0.5MG /DOS, (OZEMPIC,  0.25 OR 0.5 MG/DOSE,) 2 MG/1.5ML SOPN Inject 0.5 mg into the skin once a week. Start 0.25 mg [place] q week for 4 weeks and then increase to 0.5 mg q week.   No facility-administered encounter medications on file as of 01/29/2021.    Allergies (verified) Tetracycline   History: Past Medical History:  Diagnosis Date  . ALLERGIC RHINITIS 11/12/2006  . Allergy   . Asthma 2009   patient denies this dx  . DIABETES MELLITUS, TYPE II 08/07/2006  . ECZEMA 11/08/2008  . ERECTILE DYSFUNCTION 11/12/2006  . GENERALIZED VACCINIA AS COMP MEDICAL CARE NEC 09/09/2010  . GERD (gastroesophageal reflux disease)   . HYPERLIPIDEMIA 08/07/2006  . HYPERTENSION 08/07/2006  . Neoplasm of uncertain behavior of skin 02/07/2009  . OSTEOARTHRITIS 08/07/2006   hands  . PLANTAR FASCIITIS 11/12/2006   Past Surgical History:  Procedure Laterality Date  . COLONOSCOPY  08/2015   [place]-polyps  . KNEE ARTHROSCOPY     right  . TOENAIL EXCISION  10/2019  . TONSILLECTOMY AND ADENOIDECTOMY  1955  . WISDOM TOOTH EXTRACTION     Family History  Problem Relation Age of Onset  . Diabetes Mother   . Alzheimer's disease Father        died age 73  . Diabetes Sister        4 sisters with diabetes  . Diabetes Brother   . Diabetes Daughter   . Colon cancer Neg Hx   . Esophageal cancer Neg Hx   .  Stomach cancer Neg Hx   . Rectal cancer Neg Hx    Social History   Socioeconomic History  . Marital status: Married    Spouse name: Not on file  . Number of children: Not on file  . Years of education: Not on file  . Highest education level: Not on file  Occupational History    Comment: retired  Tobacco Use  . Smoking status: Former Smoker    Packs/day: 2.00    Years: 20.00    Pack years: 40.00    Types: Cigarettes    Quit date: 03/10/1987    Years since quitting: 33.9  . Smokeless tobacco: Never Used  Vaping Use  . Vaping Use: Never used  Substance and Sexual Activity  . Alcohol use: Yes    Alcohol/week: 2.0 standard drinks     Types: 2 Standard drinks or equivalent per week  . Drug use: No  . Sexual activity: Not on file  Other Topics Concern  . Not on file  Social History Narrative  . Not on file   Social Determinants of Health   Financial Resource Strain: Low Risk   . Difficulty of Paying Living Expenses: Not hard at all  Food Insecurity: No Food Insecurity  . Worried About Charity fundraiser in the Last Year: Never true  . Ran Out of Food in the Last Year: Never true  Transportation Needs: No Transportation Needs  . Lack of Transportation (Medical): No  . Lack of Transportation (Non-Medical): No  Physical Activity: Insufficiently Active  . Days of Exercise per Week: 3 days  . Minutes of Exercise per Session: 30 min  Stress: No Stress Concern Present  . Feeling of Stress : Not at all  Social Connections: Moderately Integrated  . Frequency of Communication with Friends and Family: More than three times a week  . Frequency of Social Gatherings with Friends and Family: More than three times a week  . Attends Religious Services: Never  . Active Member of Clubs or Organizations: Yes  . Attends Archivist Meetings: 1 to 4 times per year  . Marital Status: Married    Tobacco Counseling Counseling given: Not Answered   Clinical Intake:  Pre-visit preparation completed: Yes  Pain : No/denies pain     BMI - recorded: 32.92 Nutritional Status: BMI > 30  Obese Nutritional Risks: None Diabetes: Yes CBG done?: No Did pt. bring in CBG monitor from home?: No  How often do you need to have someone help you when you read instructions, pamphlets, or other written materials from your doctor or pharmacy?: 1 - Never  Diabetic?Nutrition Risk Assessment:  Has the patient had any N/V/D within the last 2 months?  No  Does the patient have any non-healing wounds?  No  Has the patient had any unintentional weight loss or weight gain?  No   Diabetes:  Is the patient diabetic?  Yes  If  diabetic, was a CBG obtained today?  No  Did the patient bring in their glucometer from home?  No  How often do you monitor your CBG's? N/A.   Financial Strains and Diabetes Management:  Are you having any financial strains with the device, your supplies or your medication? No .  Does the patient want to be seen by Chronic Care Management for management of their diabetes?  No  Would the patient like to be referred to a Nutritionist or for Diabetic Management?  No   Diabetic Exams:  Diabetic Eye  Exam: Completed 07/18/20 Diabetic Foot Exam: Completed 10/30/20   Interpreter Needed?: No  Information entered by :: Charlott Rakes, LPN   Activities of Daily Living In your present state of health, do you have any difficulty performing the following activities: 01/29/2021  Hearing? Y  Comment mild loss  Vision? N  Difficulty concentrating or making decisions? N  Walking or climbing stairs? N  Dressing or bathing? N  Doing errands, shopping? N  Preparing Food and eating ? N  Using the Toilet? N  In the past six months, have you accidently leaked urine? N  Do you have problems with loss of bowel control? N  Managing your Medications? N  Managing your Finances? N  Housekeeping or managing your Housekeeping? N  Some recent data might be hidden    Patient Care Team: [name], MD as PCP - General  Indicate any recent Medical Services you may have received from other than [facility] providers in the past year (date may be approximate).     Assessment:   This is a routine wellness examination for [place].  Hearing/Vision screen  Hearing Screening   125Hz  250Hz  500Hz  1000Hz  2000Hz  3000Hz  4000Hz  6000Hz  8000Hz   Right ear:           Left ear:           Comments: Pt stated mild loss   Vision Screening Comments: Pt follows up with Dr Delman Cheadle for annual eye exams   Dietary issues and exercise activities discussed: Current Exercise Habits: Home exercise routine, Type of exercise:  walking, Time (Minutes): 30, Frequency (Times/Week): 3, Weekly Exercise (Minutes/Week): 90  Goals    . Patient Stated     None at this time       Depression Screen PHQ 2/9 Scores 01/29/2021 10/17/2020 03/14/2020 11/22/2018 08/19/2017 06/17/2016 12/12/2014  PHQ - 2 Score 0 0 0 0 0 0 0  PHQ- 9 Score - - - 0 - - -    Fall Risk Fall Risk  01/29/2021 10/17/2020 03/14/2020 11/22/2018 08/19/2017  Falls in the past year? 0 0 0 0 No  Number falls in past yr: 0 - 0 - -  Injury with Fall? 0 - 0 - -  Risk for fall due to : Impaired vision - - - -  Follow up Falls prevention discussed - Falls evaluation completed - -    FALL RISK PREVENTION PERTAINING TO THE HOME:  Any stairs in or around the home? Yes  If so, are there any without handrails? No  Home free of loose throw rugs in walkways, pet beds, electrical cords, etc? Yes  Adequate lighting in your home to reduce risk of falls? Yes   ASSISTIVE DEVICES UTILIZED TO PREVENT FALLS:  Life alert? No  Use of a cane, walker or w/c? No  Grab bars in the bathroom? No  Shower chair or bench in shower? Yes  Elevated toilet seat or a handicapped toilet? No   TIMED UP AND GO:  Was the test performed? Yes .  Length of time to ambulate 10 feet: 10 sec.   Gait steady and fast without use of assistive device  Cognitive Function:     6CIT Screen 01/29/2021  What Year? 0 points  What month? 0 points  Count back from 20 0 points  Months in reverse 0 points  Repeat phrase 0 points    Immunizations Immunization History  Administered Date(s) Administered  . Influenza Split 07/10/2011, 06/04/2013  . Influenza Whole 07/02/2006, 08/11/2007, 07/24/2008, 07/05/2010,  06/27/2012  . Influenza, High Dose Seasonal PF 06/17/2016, 07/11/2017, 05/28/2019  . Influenza-Unspecified 06/03/2014, 06/10/2015, 07/11/2017, 05/22/2018, 05/28/2019, 06/12/2020  . Moderna Sars-Covid-2 Vaccination 10/12/2019, 11/09/2019, 05/20/2020, 07/18/2020  . Pneumococcal Conjugate-13  09/13/2014  . Pneumococcal Polysaccharide-23 10/07/2007, 10/17/2016  . Td 10/07/2003  . Tdap 06/14/2015  . Zoster 11/05/2013    TDAP status: Up to date  Flu Vaccine status: Up to date  Pneumococcal vaccine status: Up to date  Covid-19 vaccine status: Completed vaccines  Qualifies for Shingles Vaccine? Yes   Zostavax completed Yes   Shingrix Completed?: No.    Education has been provided regarding the importance of this vaccine. Patient has been advised to call insurance company to determine out of pocket expense if they have not yet received this vaccine. Advised may also receive vaccine at local pharmacy or Health Dept. Verbalized acceptance and understanding.  Screening Tests Health Maintenance  Topic Date Due  . COVID-19 Vaccine (4 - Booster for Moderna series) 01/16/2021  . INFLUENZA VACCINE  05/06/2021  . HEMOGLOBIN A1C  05/13/2021  . OPHTHALMOLOGY EXAM  07/18/2021  . FOOT EXAM  10/30/2021  . COLONOSCOPY (Pts 45-57yrs Insurance coverage will need to be confirmed)  11/18/2021  . TETANUS/TDAP  06/13/2025  . Hepatitis C Screening  Completed  . PNA vac Low Risk Adult  Completed  . HPV VACCINES  Aged Out    Health Maintenance  Health Maintenance Due  Topic Date Due  . COVID-19 Vaccine (4 - Booster for Moderna series) 01/16/2021    Colorectal cancer screening: Type of screening: Colonoscopy. Completed 11/18/18. Repeat every 3 years   Additional Screening:  Hepatitis C Screening: Completed 11/20/17  Vision Screening: Recommended annual ophthalmology exams for early detection of glaucoma and other disorders of the eye. Is the patient up to date with their annual eye exam?  Yes  Who is the provider or what is the name of the office in which the patient attends annual eye exams? Dr Delman Cheadle  If pt is not established with a provider, would they like to be referred to a provider to establish care? No .   Dental Screening: Recommended annual dental exams for proper oral  hygiene  Community Resource Referral / Chronic Care Management: CRR required this visit?  No   CCM required this visit?  No      Plan:     I have personally reviewed and noted the following in the patient's chart:   . Medical and social history . Use of alcohol, tobacco or illicit drugs  . Current medications and supplements . Functional ability and status . Nutritional status . Physical activity . Advanced directives . List of other physicians . Hospitalizations, surgeries, and ER visits in previous 12 months . Vitals . Screenings to include cognitive, depression, and falls . Referrals and appointments  In addition, I have reviewed and discussed with patient certain preventive protocols, quality metrics, and best practice recommendations. A written personalized care plan for preventive services as well as general preventive health recommendations were provided to patient.     Willette Brace, LPN   [id number]   Nurse Notes: None

## 2021-02-11 ENCOUNTER — Encounter: Payer: Self-pay | Admitting: Family Medicine

## 2021-02-11 ENCOUNTER — Other Ambulatory Visit: Payer: Self-pay

## 2021-02-11 ENCOUNTER — Ambulatory Visit (INDEPENDENT_AMBULATORY_CARE_PROVIDER_SITE_OTHER): Payer: Medicare Other | Admitting: Family Medicine

## 2021-02-11 VITALS — BP 130/70 | HR 100 | Temp 98.2°F | Ht 68.0 in | Wt 217.0 lb

## 2021-02-11 DIAGNOSIS — E1165 Type 2 diabetes mellitus with hyperglycemia: Secondary | ICD-10-CM | POA: Diagnosis not present

## 2021-02-11 DIAGNOSIS — E785 Hyperlipidemia, unspecified: Secondary | ICD-10-CM

## 2021-02-11 DIAGNOSIS — I10 Essential (primary) hypertension: Secondary | ICD-10-CM

## 2021-02-11 LAB — POCT GLYCOSYLATED HEMOGLOBIN (HGB A1C): Hemoglobin A1C: 7.2 % — AB (ref 4.0–5.6)

## 2021-02-11 MED ORDER — METFORMIN HCL 1000 MG PO TABS: 1000.0000 mg | ORAL_TABLET | Freq: Two times a day (BID) | ORAL | 3 refills | Status: DC

## 2021-02-11 NOTE — Patient Instructions (Signed)
Diabetes Mellitus and Nutrition, Adult When you have diabetes, or diabetes mellitus, it is very important to have healthy eating habits because your blood sugar (glucose) levels are greatly affected by what you eat and drink. Eating healthy foods in the right amounts, at about the same times every day, can help you:  Control your blood glucose.  Lower your risk of heart disease.  Improve your blood pressure.  Reach or maintain a healthy weight. What can affect my meal plan? Every person with diabetes is different, and each person has different needs for a meal plan. Your health care provider may recommend that you work with a dietitian to make a meal plan that is best for you. Your meal plan may vary depending on factors such as:  The calories you need.  The medicines you take.  Your weight.  Your blood glucose, blood pressure, and cholesterol levels.  Your activity level.  Other health conditions you have, such as heart or kidney disease. How do carbohydrates affect me? Carbohydrates, also called carbs, affect your blood glucose level more than any other type of food. Eating carbs naturally raises the amount of glucose in your blood. Carb counting is a method for keeping track of how many carbs you eat. Counting carbs is important to keep your blood glucose at a healthy level, especially if you use insulin or take certain oral diabetes medicines. It is important to know how many carbs you can safely have in each meal. This is different for every person. Your dietitian can help you calculate how many carbs you should have at each meal and for each snack. How does alcohol affect me? Alcohol can cause a sudden decrease in blood glucose (hypoglycemia), especially if you use insulin or take certain oral diabetes medicines. Hypoglycemia can be a life-threatening condition. Symptoms of hypoglycemia, such as sleepiness, dizziness, and confusion, are similar to symptoms of having too much  alcohol.  Do not drink alcohol if: ? Your health care provider tells you not to drink. ? You are pregnant, may be pregnant, or are planning to become pregnant.  If you drink alcohol: ? Do not drink on an empty stomach. ? Limit how much you use to:  0-1 drink a day for women.  0-2 drinks a day for men. ? Be aware of how much alcohol is in your drink. In the [place] one drink equals one 12 oz bottle of beer (355 mL), one 5 oz glass of wine (148 mL), or one 1 oz glass of hard liquor (44 mL). ? Keep yourself hydrated with water, diet soda, or unsweetened iced tea.  Keep in mind that regular soda, juice, and other mixers may contain a lot of sugar and must be counted as carbs. What are tips for following this plan? Reading food labels  Start by checking the serving size on the "Nutrition Facts" label of packaged foods and drinks. The amount of calories, carbs, fats, and other nutrients listed on the label is based on one serving of the item. Many items contain more than one serving per package.  Check the total grams (g) of carbs in one serving. You can calculate the number of servings of carbs in one serving by dividing the total carbs by 15. For example, if a food has 30 g of total carbs per serving, it would be equal to 2 servings of carbs.  Check the number of grams (g) of saturated fats and trans fats in one serving. Choose foods that have   a low amount or none of these fats.  Check the number of milligrams (mg) of salt (sodium) in one serving. Most people should limit total sodium intake to less than 2,300 mg per day.  Always check the nutrition information of foods labeled as "low-fat" or "nonfat." These foods may be higher in added sugar or refined carbs and should be avoided.  Talk to your dietitian to identify your daily goals for nutrients listed on the label. Shopping  Avoid buying canned, pre-made, or processed foods. These foods tend to be high in fat, sodium, and added  sugar.  Shop around the outside edge of the grocery store. This is where you will most often find fresh fruits and vegetables, bulk grains, fresh meats, and fresh dairy. Cooking  Use low-heat cooking methods, such as baking, instead of high-heat cooking methods like deep frying.  Cook using healthy oils, such as olive, canola, or sunflower oil.  Avoid cooking with butter, cream, or high-fat meats. Meal planning  Eat meals and snacks regularly, preferably at the same times every day. Avoid going long periods of time without eating.  Eat foods that are high in fiber, such as fresh fruits, vegetables, beans, and whole grains. Talk with your dietitian about how many servings of carbs you can eat at each meal.  Eat 4-6 oz (112-168 g) of lean protein each day, such as lean meat, chicken, fish, eggs, or tofu. One ounce (oz) of lean protein is equal to: ? 1 oz (28 g) of meat, chicken, or fish. ? 1 egg. ?  cup (62 g) of tofu.  Eat some foods each day that contain healthy fats, such as avocado, nuts, seeds, and fish.   What foods should I eat? Fruits Berries. Apples. Oranges. Peaches. Apricots. Plums. Grapes. Mango. Papaya. Pomegranate. Kiwi. Cherries. Vegetables Lettuce. Spinach. Leafy greens, including kale, chard, collard greens, and mustard greens. Beets. Cauliflower. Cabbage. Broccoli. Carrots. Green beans. Tomatoes. Peppers. Onions. Cucumbers. Brussels sprouts. Grains Whole grains, such as whole-wheat or whole-grain bread, crackers, tortillas, cereal, and pasta. Unsweetened oatmeal. Quinoa. Brown or wild rice. Meats and other proteins Seafood. Poultry without skin. Lean cuts of poultry and beef. Tofu. Nuts. Seeds. Dairy Low-fat or fat-free dairy products such as milk, yogurt, and cheese. The items listed above may not be a complete list of foods and beverages you can eat. Contact a dietitian for more information. What foods should I avoid? Fruits Fruits canned with  syrup. Vegetables Canned vegetables. Frozen vegetables with butter or cream sauce. Grains Refined white flour and flour products such as bread, pasta, snack foods, and cereals. Avoid all processed foods. Meats and other proteins Fatty cuts of meat. Poultry with skin. Breaded or fried meats. Processed meat. Avoid saturated fats. Dairy Full-fat yogurt, cheese, or milk. Beverages Sweetened drinks, such as soda or iced tea. The items listed above may not be a complete list of foods and beverages you should avoid. Contact a dietitian for more information. Questions to ask a health care provider  Do I need to meet with a diabetes educator?  Do I need to meet with a dietitian?  What number can I call if I have questions?  When are the best times to check my blood glucose? Where to find more information:  American Diabetes Association: diabetes.org  Academy of Nutrition and Dietetics: www.eatright.org  National Institute of Diabetes and Digestive and Kidney Diseases: www.niddk.nih.gov  Association of Diabetes Care and Education Specialists: www.diabeteseducator.org Summary  It is important to have healthy eating   habits because your blood sugar (glucose) levels are greatly affected by what you eat and drink.  A healthy meal plan will help you control your blood glucose and maintain a healthy lifestyle.  Your health care provider may recommend that you work with a dietitian to make a meal plan that is best for you.  Keep in mind that carbohydrates (carbs) and alcohol have immediate effects on your blood glucose levels. It is important to count carbs and to use alcohol carefully. This information is not intended to replace advice given to you by your health care provider. Make sure you discuss any questions you have with your health care provider. Document Revised: 08/30/2019 Document Reviewed: 08/30/2019 Elsevier Patient Education  2021 Elsevier Inc.  

## 2021-02-11 NOTE — Progress Notes (Signed)
Established Patient Office Visit  Subjective:  Patient ID: Richard Dalton, male    DOB: 03/10/1951  Age: 70 y.o. MRN: [id number]  CC:  Chief Complaint  Patient presents with  . Follow-up    HPI Richard Dalton presents for medical follow-up.  Type 2 diabetes, hyperlipidemia, chronic kidney disease, hypertension.  He is followed by nephrology.  Renal functions remained stable.  Last GFR was around 45.  Medications reviewed.  Compliant with all.  Weight stable compared to last visit.  No recent hypoglycemia.  Last A1c 7.3%.  Blood pressures have been stable.  He reports he had recent renal ultrasound per nephrology which showed no hydronephrosis or other concerns.  He has hyperlipidemia treated with Lipitor.  Recent triglycerides 160.  LDL cholesterol 76  Past Medical History:  Diagnosis Date  . ALLERGIC RHINITIS 11/12/2006  . Allergy   . Asthma 2009   patient denies this dx  . DIABETES MELLITUS, TYPE II 08/07/2006  . ECZEMA 11/08/2008  . ERECTILE DYSFUNCTION 11/12/2006  . GENERALIZED VACCINIA AS COMP MEDICAL CARE NEC 09/09/2010  . GERD (gastroesophageal reflux disease)   . HYPERLIPIDEMIA 08/07/2006  . HYPERTENSION 08/07/2006  . Neoplasm of uncertain behavior of skin 02/07/2009  . OSTEOARTHRITIS 08/07/2006   hands  . PLANTAR FASCIITIS 11/12/2006    Past Surgical History:  Procedure Laterality Date  . COLONOSCOPY  08/2015   [place]-polyps  . KNEE ARTHROSCOPY     right  . TOENAIL EXCISION  10/2019  . TONSILLECTOMY AND ADENOIDECTOMY  1955  . WISDOM TOOTH EXTRACTION      Family History  Problem Relation Age of Onset  . Diabetes Mother   . Alzheimer's disease Father        died age 6  . Diabetes Sister        4 sisters with diabetes  . Diabetes Brother   . Diabetes Daughter   . Colon cancer Neg Hx   . Esophageal cancer Neg Hx   . Stomach cancer Neg Hx   . Rectal cancer Neg Hx     Social History   Socioeconomic History  . Marital status: Married    Spouse name: Not on file  .  Number of children: Not on file  . Years of education: Not on file  . Highest education level: Not on file  Occupational History    Comment: retired  Tobacco Use  . Smoking status: Former Smoker    Packs/day: 2.00    Years: 20.00    Pack years: 40.00    Types: Cigarettes    Quit date: 03/10/1987    Years since quitting: 33.9  . Smokeless tobacco: Never Used  Vaping Use  . Vaping Use: Never used  Substance and Sexual Activity  . Alcohol use: Yes    Alcohol/week: 2.0 standard drinks    Types: 2 Standard drinks or equivalent per week  . Drug use: No  . Sexual activity: Not on file  Other Topics Concern  . Not on file  Social History Narrative  . Not on file   Social Determinants of Health   Financial Resource Strain: Low Risk   . Difficulty of Paying Living Expenses: Not hard at all  Food Insecurity: No Food Insecurity  . Worried About Charity fundraiser in the Last Year: Never true  . Ran Out of Food in the Last Year: Never true  Transportation Needs: No Transportation Needs  . Lack of Transportation (Medical): No  . Lack of Transportation (Non-Medical): No  Physical Activity: Insufficiently Active  . Days of Exercise per Week: 3 days  . Minutes of Exercise per Session: 30 min  Stress: No Stress Concern Present  . Feeling of Stress : Not at all  Social Connections: Moderately Integrated  . Frequency of Communication with Friends and Family: More than three times a week  . Frequency of Social Gatherings with Friends and Family: More than three times a week  . Attends Religious Services: Never  . Active Member of Clubs or Organizations: Yes  . Attends Archivist Meetings: 1 to 4 times per year  . Marital Status: Married  Human resources officer Violence: Not At Risk  . Fear of Current or Ex-Partner: No  . Emotionally Abused: No  . Physically Abused: No  . Sexually Abused: No    Outpatient Medications Prior to Visit  Medication Sig Dispense Refill  . ADVAIR  DISKUS 250-50 MCG/DOSE AEPB INHALE 1 PUFF BY MOUTH TWICE DAILY 60 each 1  . atorvastatin (LIPITOR) 20 MG tablet TAKE 1 TABLET BY MOUTH DAILY 90 tablet 2  . chlorpheniramine (CHLOR-TRIMETON) 4 MG tablet Take 4 mg by mouth every 8 (eight) hours.    . famotidine (PEPCID) 20 MG tablet Take 20 mg by mouth daily.    . fluticasone (FLONASE) 50 MCG/ACT nasal spray Place into both nostrils daily.    . IRON PO Take 65 mg by mouth once.    Marland Kitchen JARDIANCE 10 MG TABS tablet TAKE 1 TABLET BY MOUTH DAILY 90 tablet 1  . levocetirizine (XYZAL) 5 MG tablet Take 5 mg by mouth every evening.    Marland Kitchen lisinopril-hydrochlorothiazide (ZESTORETIC) 10-12.5 MG tablet TAKE 1 TABLET BY MOUTH DAILY 90 tablet 1  . montelukast (SINGULAIR) 10 MG tablet TAKE 1 TABLET(10 MG) BY MOUTH AT BEDTIME 90 tablet 1  . pioglitazone (ACTOS) 15 MG tablet TAKE 1 TABLET BY MOUTH DAILY 90 tablet 1  . Semaglutide,0.25 or 0.5MG /DOS, (OZEMPIC, 0.25 OR 0.5 MG/DOSE,) 2 MG/1.5ML SOPN Inject 0.5 mg into the skin once a week. Start 0.25 mg [place] q week for 4 weeks and then increase to 0.5 mg q week. 4.5 mL 3  . metFORMIN (GLUCOPHAGE) 1000 MG tablet TAKE 1 TABLET BY MOUTH DAILY WITH BREAKFAST 90 tablet 1   No facility-administered medications prior to visit.    Allergies  Allergen Reactions  . Tetracycline     REACTION: rash    ROS Review of Systems  Constitutional: Negative for fatigue.  Eyes: Negative for visual disturbance.  Respiratory: Negative for cough, chest tightness and shortness of breath.   Cardiovascular: Negative for chest pain, palpitations and leg swelling.  Neurological: Negative for dizziness, syncope, weakness, light-headedness and headaches.      Objective:    Physical Exam Constitutional:      Appearance: He is well-developed.  HENT:     Right Ear: External ear normal.     Left Ear: External ear normal.  Eyes:     Pupils: Pupils are equal, round, and reactive to light.  Neck:     Thyroid: No thyromegaly.   Cardiovascular:     Rate and Rhythm: Normal rate and regular rhythm.  Pulmonary:     Effort: Pulmonary effort is normal. No respiratory distress.     Breath sounds: Normal breath sounds. No wheezing or rales.  Musculoskeletal:     Cervical back: Neck supple.     Right lower leg: No edema.     Left lower leg: No edema.  Neurological:  Mental Status: He is alert and oriented to person, place, and time.     BP 130/70   Pulse 100   Temp 98.2 F (36.8 C) (Oral)   Ht 5\' 8"  (1.727 m)   Wt 217 lb (98.4 kg)   SpO2 98%   BMI 32.99 kg/m  Wt Readings from Last 3 Encounters:  02/11/21 217 lb (98.4 kg)  01/29/21 216 lb 8 oz (98.2 kg)  11/13/20 219 lb 12.8 oz (99.7 kg)     Health Maintenance Due  Topic Date Due  . COVID-19 Vaccine (4 - Booster for Moderna series) 01/16/2021    There are no preventive care reminders to display for this patient.  Lab Results  Component Value Date   TSH 1.60 11/20/2017   Lab Results  Component Value Date   WBC 5.6 11/20/2017   HGB 15.2 11/20/2017   HCT 45.8 11/20/2017   MCV 94.2 11/20/2017   PLT 231.0 11/20/2017   Lab Results  Component Value Date   NA 139 11/13/2020   K 4.2 11/13/2020   CO2 35 (H) 11/13/2020   GLUCOSE 132 (H) 11/13/2020   BUN 24 (H) 11/13/2020   CREATININE 1.52 (H) 11/13/2020   BILITOT 0.6 11/13/2020   ALKPHOS 92 11/13/2020   AST 17 11/13/2020   ALT 20 11/13/2020   PROT 6.7 11/13/2020   ALBUMIN 4.2 11/13/2020   CALCIUM 9.4 11/13/2020   GFR 45.97 (L) 11/13/2020   Lab Results  Component Value Date   CHOL 160 11/13/2020   Lab Results  Component Value Date   HDL 50.30 11/13/2020   Lab Results  Component Value Date   LDLCALC 76 11/13/2020   Lab Results  Component Value Date   TRIG 166.0 (H) 11/13/2020   Lab Results  Component Value Date   CHOLHDL 3 11/13/2020   Lab Results  Component Value Date   HGBA1C 7.2 (A) 02/11/2021      Assessment & Plan:   #1 type 2 diabetes.  A1c 7.2%.  Not quite  to goal. -We discussed increasing metformin to 1000 mg twice daily.  Recent GFR 45.  We will need to continue to watch renal function closely -Reassess A1c in 3 months  #2 hypertension stable and at goal -Continue current medications  #3 hyperlipidemia.  Recent LDL cholesterol 76. -Continue atorvastatin 20 mg daily.  We did discuss possible increase to high-dose statin use but he has no history of CAD or cerebrovascular disease.  May consider bumping up atorvastatin after next lipid panel if still up above 70 at that point  Meds ordered this encounter  Medications  . metFORMIN (GLUCOPHAGE) 1000 MG tablet    Sig: Take 1 tablet (1,000 mg total) by mouth 2 (two) times daily with a meal.    Dispense:  180 tablet    Refill:  3    Follow-up: Return in about 3 months (around 05/14/2021).    Carolann Littler, MD

## 2021-02-18 ENCOUNTER — Other Ambulatory Visit: Payer: Self-pay | Admitting: Family Medicine

## 2021-02-23 ENCOUNTER — Other Ambulatory Visit: Payer: Self-pay | Admitting: Family Medicine

## 2021-03-05 ENCOUNTER — Encounter: Payer: Self-pay | Admitting: Family Medicine

## 2021-03-06 MED ORDER — EMPAGLIFLOZIN 25 MG PO TABS: 25.0000 mg | ORAL_TABLET | Freq: Every day | ORAL | 1 refills | Status: DC

## 2021-03-13 ENCOUNTER — Telehealth: Payer: Medicare Other | Admitting: Physician Assistant

## 2021-03-13 DIAGNOSIS — J069 Acute upper respiratory infection, unspecified: Secondary | ICD-10-CM

## 2021-03-13 MED ORDER — BENZONATATE 100 MG PO CAPS
100.0000 mg | ORAL_CAPSULE | Freq: Three times a day (TID) | ORAL | 0 refills | Status: DC | PRN
Start: 2021-03-13 — End: 2021-03-15

## 2021-03-13 NOTE — Progress Notes (Signed)
I have spent 5 minutes in review of e-visit questionnaire, review and updating patient chart, medical decision making and response to patient.   Tykia Mellone Cody Kaycee Mcgaugh, PA-C    

## 2021-03-13 NOTE — Progress Notes (Signed)
We are sorry you are not feeling well.  Here is how we plan to help!  Based on what you have shared with me, it looks like you may have a viral upper respiratory infection.  Upper respiratory infections are caused by a large number of viruses; however, rhinovirus is the most common cause.  Even though you have been vaccinated, if anything worsens despite treatment below, you need to be COVID tested to be safe.   Symptoms vary from person to person, with common symptoms including sore throat, cough, fatigue or lack of energy and feeling of general discomfort.  A low-grade fever of up to 100.4 may present, but is often uncommon.  Symptoms vary however, and are closely related to a person's age or underlying illnesses.  The most common symptoms associated with an upper respiratory infection are nasal discharge or congestion, cough, sneezing, headache and pressure in the ears and face.  These symptoms usually persist for about 3 to 10 days, but can last up to 2 weeks.  It is important to know that upper respiratory infections do not cause serious illness or complications in most cases.    Upper respiratory infections can be transmitted from person to person, with the most common method of transmission being a person's hands.  The virus is able to live on the skin and can infect other persons for up to 2 hours after direct contact.  Also, these can be transmitted when someone coughs or sneezes; thus, it is important to cover the mouth to reduce this risk.  To keep the spread of the illness at [place], good hand hygiene is very important.  This is an infection that is most likely caused by a virus. There are no specific treatments other than to help you with the symptoms until the infection runs its course.  We are sorry you are not feeling well.  Here is how we plan to help!   For nasal congestion, you may use an oral decongestants such as Mucinex D or if you have glaucoma or high blood pressure use plain Mucinex.   Saline nasal spray or nasal drops can help and can safely be used as often as needed for congestion.    If you do not have a history of heart disease, hypertension, diabetes or thyroid disease, prostate/bladder issues or glaucoma, you may also use Sudafed to treat nasal congestion.  It is highly recommended that you consult with a pharmacist or your primary care physician to ensure this medication is safe for you to take.     If you have a cough, you may use cough suppressants such as Delsym and Robitussin.  If you have glaucoma or high blood pressure, you can also use Coricidin HBP.   For cough I have prescribed for you A prescription cough medication called Tessalon Perles 100 mg. You may take 1-2 capsules every 8 hours as needed for cough  If you have a sore or scratchy throat, use a saltwater gargle-  to  teaspoon of salt dissolved in a 4-ounce to 8-ounce glass of warm water.  Gargle the solution for approximately 15-30 seconds and then spit.  It is important not to swallow the solution.  You can also use throat lozenges/cough drops and Chloraseptic spray to help with throat pain or discomfort.  Warm or cold liquids can also be helpful in relieving throat pain.  For headache, pain or general discomfort, you can use Ibuprofen or Tylenol as directed.   Some authorities believe that  zinc sprays or the use of Echinacea may shorten the course of your symptoms.   HOME CARE . Only take medications as instructed by your medical team. . Be sure to drink plenty of fluids. Water is fine as well as fruit juices, sodas and electrolyte beverages. You may want to stay away from caffeine or alcohol. If you are nauseated, try taking small sips of liquids. How do you know if you are getting enough fluid? Your urine should be a pale yellow or almost colorless. . Get rest. . Taking a steamy shower or using a humidifier may help nasal congestion and ease sore throat pain. You can place a towel over your head and  breathe in the steam from hot water coming from a faucet. . Using a saline nasal spray works much the same way. . Cough drops, hard candies and sore throat lozenges may ease your cough. . Avoid close contacts especially the very young and the elderly . Cover your mouth if you cough or sneeze . Always remember to wash your hands.   GET HELP RIGHT AWAY IF: . You develop worsening fever. . If your symptoms do not improve within 10 days . You develop yellow or green discharge from your nose over 3 days. . You have coughing fits . You develop a severe head ache or visual changes. . You develop shortness of breath, difficulty breathing or start having chest pain . Your symptoms persist after you have completed your treatment plan  MAKE SURE YOU   Understand these instructions.  Will watch your condition.  Will get help right away if you are not doing well or get worse.  Your e-visit answers were reviewed by a board certified advanced clinical practitioner to complete your personal care plan. Depending upon the condition, your plan could have included both over the counter or prescription medications. Please review your pharmacy choice. If there is a problem, you may call our nursing hot line at and have the prescription routed to another pharmacy. Your safety is important to [place]. If you have drug allergies check your prescription carefully.   You can use MyChart to ask questions about today's visit, request a non-urgent call back, or ask for a work or school excuse for 24 hours related to this e-Visit. If it has been greater than 24 hours you will need to follow up with your provider, or enter a new e-Visit to address those concerns. You will get an e-mail in the next two days asking about your experience.  I hope that your e-visit has been valuable and will speed your recovery. Thank you for using e-visits.

## 2021-03-15 ENCOUNTER — Telehealth (INDEPENDENT_AMBULATORY_CARE_PROVIDER_SITE_OTHER): Payer: Medicare Other | Admitting: Family Medicine

## 2021-03-15 ENCOUNTER — Other Ambulatory Visit: Payer: Self-pay

## 2021-03-15 DIAGNOSIS — J069 Acute upper respiratory infection, unspecified: Secondary | ICD-10-CM | POA: Diagnosis not present

## 2021-03-15 MED ORDER — HYDROCODONE BIT-HOMATROP MBR 5-1.5 MG/5ML PO SOLN: 5.0000 mL | Freq: Three times a day (TID) | ORAL | 0 refills | Status: DC | PRN

## 2021-03-15 NOTE — Progress Notes (Signed)
Patient ID: Richard Dalton, male   DOB: August 19, 1951, 70 y.o.   MRN: [id number]  This visit type was conducted due to national recommendations for restrictions regarding the COVID-19 pandemic in an effort to limit this patient's exposure and mitigate transmission in our community.   Virtual Visit via Video Note  I connected with [name] on 03/15/21 at  8:45 AM EDT by a video enabled telemedicine application and verified that I am speaking with the correct person using two identifiers.  Location patient: home Location provider:work or home office Persons participating in the virtual visit: patient, provider  I discussed the limitations of evaluation and management by telemedicine and the availability of in person appointments. The patient expressed understanding and agreed to proceed.   HPI:  Marquet relates onset this past Sunday of some nasal congestion, cough, sore throat.  Increased postnasal drip symptoms.  COVID test yesterday was negative.  Several other family members in the home and had very similar symptoms.  He had no fever.  Cough has been the most severe.  He has staccato type cough at times.  He had ED visit couple days ago and prescribed Tessalon Perles which does not help much.  Cough mostly nonproductive.  He does take inhalers regularly and feels he is not having any asthma exacerbation with no obvious wheezing at this time   ROS: See pertinent positives and negatives per HPI.  Past Medical History:  Diagnosis Date   ALLERGIC RHINITIS 11/12/2006   Allergy    Asthma 2009   patient denies this dx   DIABETES MELLITUS, TYPE II 08/07/2006   ECZEMA 11/08/2008   ERECTILE DYSFUNCTION 11/12/2006   GENERALIZED VACCINIA AS COMP MEDICAL CARE NEC 09/09/2010   GERD (gastroesophageal reflux disease)    HYPERLIPIDEMIA 08/07/2006   HYPERTENSION 08/07/2006   Neoplasm of uncertain behavior of skin 02/07/2009   OSTEOARTHRITIS 08/07/2006   hands   PLANTAR FASCIITIS 11/12/2006    Past Surgical  History:  Procedure Laterality Date   COLONOSCOPY  08/2015   Perry-polyps   KNEE ARTHROSCOPY     right   TOENAIL EXCISION  10/2019   TONSILLECTOMY AND ADENOIDECTOMY  1955   WISDOM TOOTH EXTRACTION      Family History  Problem Relation Age of Onset   Diabetes Mother    Alzheimer's disease Father        died age 69   Diabetes Sister        22 sisters with diabetes   Diabetes Brother    Diabetes Daughter    Colon cancer Neg Hx    Esophageal cancer Neg Hx    Stomach cancer Neg Hx    Rectal cancer Neg Hx     SOCIAL HX: Non-smoker   Current Outpatient Medications:    ADVAIR DISKUS 250-50 MCG/DOSE AEPB, INHALE 1 PUFF BY MOUTH TWICE DAILY, Disp: 60 each, Rfl: 1   atorvastatin (LIPITOR) 20 MG tablet, TAKE 1 TABLET BY MOUTH DAILY, Disp: 90 tablet, Rfl: 2   chlorpheniramine (CHLOR-TRIMETON) 4 MG tablet, Take 4 mg by mouth every 8 (eight) hours., Disp: , Rfl:    empagliflozin (JARDIANCE) 25 MG TABS tablet, Take 1 tablet (25 mg total) by mouth daily before breakfast., Disp: 90 tablet, Rfl: 1   famotidine (PEPCID) 20 MG tablet, Take 20 mg by mouth daily., Disp: , Rfl:    fluticasone (FLONASE) 50 MCG/ACT nasal spray, Place into both nostrils daily., Disp: , Rfl:    HYDROcodone bit-homatropine (HYCODAN) 5-1.5 MG/5ML syrup, Take 5 mLs by  mouth every 8 (eight) hours as needed for cough., Disp: 120 mL, Rfl: 0   IRON PO, Take 65 mg by mouth once., Disp: , Rfl:    levocetirizine (XYZAL) 5 MG tablet, Take 5 mg by mouth every evening., Disp: , Rfl:    lisinopril-hydrochlorothiazide (ZESTORETIC) 10-12.5 MG tablet, TAKE 1 TABLET BY MOUTH DAILY, Disp: 90 tablet, Rfl: 1   metFORMIN (GLUCOPHAGE) 1000 MG tablet, Take 1 tablet (1,000 mg total) by mouth 2 (two) times daily with a meal., Disp: 180 tablet, Rfl: 3   montelukast (SINGULAIR) 10 MG tablet, TAKE 1 TABLET(10 MG) BY MOUTH AT BEDTIME, Disp: 90 tablet, Rfl: 1   OZEMPIC, 0.25 OR 0.5 MG/DOSE, 2 MG/1.5ML SOPN, START 0.25 MG SUBCUTANEOUSLY EVERY WEEK FOR  4 WEEKS AND THEN INCREASE TO 0.5 MG EVERY WEEK, Disp: 4.5 mL, Rfl: 3   pioglitazone (ACTOS) 15 MG tablet, TAKE 1 TABLET BY MOUTH DAILY, Disp: 90 tablet, Rfl: 1  EXAM:  VITALS per patient if applicable:  GENERAL: alert, oriented, appears well and in no acute distress  HEENT: atraumatic, conjunttiva clear, no obvious abnormalities on inspection of external nose and ears  NECK: normal movements of the head and neck  LUNGS: on inspection no signs of respiratory distress, breathing rate appears normal, no obvious gross SOB, gasping or wheezing  CV: no obvious cyanosis  MS: moves all visible extremities without noticeable abnormality  PSYCH/NEURO: pleasant and cooperative, no obvious depression or anxiety, speech and thought processing grossly intact  ASSESSMENT AND PLAN:  Discussed the following assessment and plan:  Viral URI with cough.  Patient in no distress.  Severe cough at times.  Not relieved with Tessalon. -We discussed using Hycodan cough syrup 1 teaspoon every 6 hours as needed for severe cough.  He is aware this may be sedating. -Follow-up immediately for any fever or any persistent or worsening symptoms     I discussed the assessment and treatment plan with the patient. The patient was provided an opportunity to ask questions and all were answered. The patient agreed with the plan and demonstrated an understanding of the instructions.   The patient was advised to call back or seek an in-person evaluation if the symptoms worsen or if the condition fails to improve as anticipated.     Carolann Littler, MD

## 2021-03-29 ENCOUNTER — Other Ambulatory Visit: Payer: Self-pay | Admitting: Family Medicine

## 2021-05-13 ENCOUNTER — Other Ambulatory Visit: Payer: Self-pay

## 2021-05-13 ENCOUNTER — Other Ambulatory Visit: Payer: Self-pay | Admitting: Family Medicine

## 2021-05-14 ENCOUNTER — Encounter: Payer: Self-pay | Admitting: Family Medicine

## 2021-05-14 ENCOUNTER — Ambulatory Visit (INDEPENDENT_AMBULATORY_CARE_PROVIDER_SITE_OTHER): Payer: Medicare Other | Admitting: Family Medicine

## 2021-05-14 VITALS — BP 132/50 | HR 95 | Temp 97.8°F | Wt 212.4 lb

## 2021-05-14 DIAGNOSIS — E1165 Type 2 diabetes mellitus with hyperglycemia: Secondary | ICD-10-CM

## 2021-05-14 LAB — POCT GLYCOSYLATED HEMOGLOBIN (HGB A1C): Hemoglobin A1C: 7.3 % — AB (ref 4.0–5.6)

## 2021-05-14 NOTE — Patient Instructions (Signed)
A1C is 7.3% today.  Continue with the weight loss efforts and hopefully we will see some additional improvements with the A1C soon!

## 2021-05-14 NOTE — Progress Notes (Signed)
Established Patient Office Visit  Subjective:  Patient ID: Richard Dalton, male    DOB: 02-14-1951  Age: 70 y.o. MRN: [id number]  CC:  Chief Complaint  Patient presents with   Follow-up    Diabetes     HPI [name] presents for medical follow-up regarding diabetes.  His other medical problems include history of obesity, hypertension, hyperlipidemia, chronic kidney disease.  He is followed by nephrology.  Last visit we had increased his Jardiance to 25 mg daily.  He has lost about 5 pounds.  He is walking some several days per week for exercise.  Home blood sugars been stable.  No hypoglycemic symptoms.  Lipids were checked last winter.  No recent chest pains.  No adverse side effects from his diabetic medications.  Past Medical History:  Diagnosis Date   ALLERGIC RHINITIS 11/12/2006   Allergy    Asthma 2009   patient denies this dx   DIABETES MELLITUS, TYPE II 08/07/2006   ECZEMA 11/08/2008   ERECTILE DYSFUNCTION 11/12/2006   GENERALIZED VACCINIA AS COMP MEDICAL CARE NEC 09/09/2010   GERD (gastroesophageal reflux disease)    HYPERLIPIDEMIA 08/07/2006   HYPERTENSION 08/07/2006   Neoplasm of uncertain behavior of skin 02/07/2009   OSTEOARTHRITIS 08/07/2006   hands   PLANTAR FASCIITIS 11/12/2006    Past Surgical History:  Procedure Laterality Date   COLONOSCOPY  08/2015   Perry-polyps   KNEE ARTHROSCOPY     right   TOENAIL EXCISION  10/2019   TONSILLECTOMY AND ADENOIDECTOMY  1955   WISDOM TOOTH EXTRACTION      Family History  Problem Relation Age of Onset   Diabetes Mother    Alzheimer's disease Father        died age 50   Diabetes Sister        40 sisters with diabetes   Diabetes Brother    Diabetes Daughter    Colon cancer Neg Hx    Esophageal cancer Neg Hx    Stomach cancer Neg Hx    Rectal cancer Neg Hx     Social History   Socioeconomic History   Marital status: Married    Spouse name: Not on file   Number of children: Not on file   Years of education: Not  on file   Highest education level: Not on file  Occupational History    Comment: retired  Tobacco Use   Smoking status: Former    Packs/day: 2.00    Years: 20.00    Pack years: 40.00    Types: Cigarettes    Quit date: 03/10/1987    Years since quitting: 34.2   Smokeless tobacco: Never  Vaping Use   Vaping Use: Never used  Substance and Sexual Activity   Alcohol use: Yes    Alcohol/week: 2.0 standard drinks    Types: 2 Standard drinks or equivalent per week   Drug use: No   Sexual activity: Not on file  Other Topics Concern   Not on file  Social History Narrative   Not on file   Social Determinants of Health   Financial Resource Strain: Low Risk    Difficulty of Paying Living Expenses: Not hard at all  Food Insecurity: No Food Insecurity   Worried About Charity fundraiser in the Last Year: Never true   [place] in the Last Year: Never true  Transportation Needs: No Transportation Needs   Lack of Transportation (Medical): No   Lack of Transportation (Non-Medical): No  Physical  Activity: Insufficiently Active   Days of Exercise per Week: 3 days   Minutes of Exercise per Session: 30 min  Stress: No Stress Concern Present   Feeling of Stress : Not at all  Social Connections: Moderately Integrated   Frequency of Communication with Friends and Family: More than three times a week   Frequency of Social Gatherings with Friends and Family: More than three times a week   Attends Religious Services: Never   Marine scientist or Organizations: Yes   Attends Music therapist: 1 to 4 times per year   Marital Status: Married  Human resources officer Violence: Not At Risk   Fear of Current or Ex-Partner: No   Emotionally Abused: No   Physically Abused: No   Sexually Abused: No    Outpatient Medications Prior to Visit  Medication Sig Dispense Refill   ADVAIR DISKUS 250-50 MCG/ACT AEPB INHALE 1 PUFF BY MOUTH TWICE DAILY 60 each 1   atorvastatin (LIPITOR) 20 MG  tablet TAKE 1 TABLET BY MOUTH DAILY 90 tablet 2   chlorpheniramine (CHLOR-TRIMETON) 4 MG tablet Take 4 mg by mouth every 8 (eight) hours.     empagliflozin (JARDIANCE) 25 MG TABS tablet Take 1 tablet (25 mg total) by mouth daily before breakfast. 90 tablet 1   famotidine (PEPCID) 20 MG tablet Take 20 mg by mouth daily.     fluticasone (FLONASE) 50 MCG/ACT nasal spray Place into both nostrils daily.     IRON PO Take 65 mg by mouth once.     levocetirizine (XYZAL) 5 MG tablet Take 5 mg by mouth every evening.     lisinopril-hydrochlorothiazide (ZESTORETIC) 10-12.5 MG tablet TAKE 1 TABLET BY MOUTH DAILY 90 tablet 1   metFORMIN (GLUCOPHAGE) 1000 MG tablet Take 1 tablet (1,000 mg total) by mouth 2 (two) times daily with a meal. 180 tablet 3   montelukast (SINGULAIR) 10 MG tablet TAKE 1 TABLET(10 MG) BY MOUTH AT BEDTIME 90 tablet 1   OZEMPIC, 0.25 OR 0.5 MG/DOSE, 2 MG/1.5ML SOPN START 0.25 MG SUBCUTANEOUSLY EVERY WEEK FOR 4 WEEKS AND THEN INCREASE TO 0.5 MG EVERY WEEK 4.5 mL 3   pioglitazone (ACTOS) 15 MG tablet TAKE 1 TABLET BY MOUTH DAILY 90 tablet 1   HYDROcodone bit-homatropine (HYCODAN) 5-1.5 MG/5ML syrup Take 5 mLs by mouth every 8 (eight) hours as needed for cough. 120 mL 0   No facility-administered medications prior to visit.    Allergies  Allergen Reactions   Tetracycline     REACTION: rash    ROS Review of Systems  Constitutional:  Negative for fatigue.  Eyes:  Negative for visual disturbance.  Respiratory:  Negative for cough, chest tightness and shortness of breath.   Cardiovascular:  Negative for chest pain, palpitations and leg swelling.  Endocrine: Negative for polydipsia and polyuria.  Neurological:  Negative for dizziness, syncope, weakness, light-headedness and headaches.     Objective:    Physical Exam Constitutional:      Appearance: He is well-developed.  Eyes:     Pupils: Pupils are equal, round, and reactive to light.  Neck:     Thyroid: No thyromegaly.   Cardiovascular:     Rate and Rhythm: Normal rate and regular rhythm.  Pulmonary:     Effort: Pulmonary effort is normal. No respiratory distress.     Breath sounds: Normal breath sounds. No wheezing or rales.  Musculoskeletal:     Cervical back: Neck supple.  Neurological:     Mental Status: He  is alert and oriented to person, place, and time.    BP (!) 132/50 (BP Location: Left Arm, Patient Position: Sitting, Cuff Size: Normal)   Pulse 95   Temp 97.8 F (36.6 C) (Oral)   Wt 212 lb 6.4 oz (96.3 kg)   SpO2 98%   BMI 32.30 kg/m  Wt Readings from Last 3 Encounters:  05/14/21 212 lb 6.4 oz (96.3 kg)  02/11/21 217 lb (98.4 kg)  01/29/21 216 lb 8 oz (98.2 kg)     Health Maintenance Due  Topic Date Due   Zoster Vaccines- Shingrix (1 of 2) Never done   COVID-19 Vaccine (5 - Booster for Moderna series) 11/18/2020   INFLUENZA VACCINE  05/06/2021    There are no preventive care reminders to display for this patient.  Lab Results  Component Value Date   TSH 1.60 11/20/2017   Lab Results  Component Value Date   WBC 5.6 11/20/2017   HGB 15.2 11/20/2017   HCT 45.8 11/20/2017   MCV 94.2 11/20/2017   PLT 231.0 11/20/2017   Lab Results  Component Value Date   NA 139 11/13/2020   K 4.2 11/13/2020   CO2 35 (H) 11/13/2020   GLUCOSE 132 (H) 11/13/2020   BUN 24 (H) 11/13/2020   CREATININE 1.52 (H) 11/13/2020   BILITOT 0.6 11/13/2020   ALKPHOS 92 11/13/2020   AST 17 11/13/2020   ALT 20 11/13/2020   PROT 6.7 11/13/2020   ALBUMIN 4.2 11/13/2020   CALCIUM 9.4 11/13/2020   GFR 45.97 (L) 11/13/2020   Lab Results  Component Value Date   CHOL 160 11/13/2020   Lab Results  Component Value Date   HDL 50.30 11/13/2020   Lab Results  Component Value Date   LDLCALC 76 11/13/2020   Lab Results  Component Value Date   TRIG 166.0 (H) 11/13/2020   Lab Results  Component Value Date   CHOLHDL 3 11/13/2020   Lab Results  Component Value Date   HGBA1C 7.3 (A) 05/14/2021       Assessment & Plan:   Type 2 diabetes.  Surprisingly, with his weight loss and recent change in medication A1c did not come down any and was 7.3% today.  We discussed options including 1 option of increasing his Ozempic to 1 mg weekly but at this point he wishes to wait and recheck in 3 months.  Hopefully he can lose some additional weight in the meantime.  -He has scheduled podiatry follow-up for his feet.  No foot issues at this time.   No orders of the defined types were placed in this encounter.   Follow-up: Return in about 3 months (around 08/14/2021).    Carolann Littler, MD

## 2021-06-11 ENCOUNTER — Encounter: Payer: Self-pay | Admitting: Family Medicine

## 2021-06-11 ENCOUNTER — Telehealth (INDEPENDENT_AMBULATORY_CARE_PROVIDER_SITE_OTHER): Payer: Medicare Other | Admitting: Family Medicine

## 2021-06-11 DIAGNOSIS — U071 COVID-19: Secondary | ICD-10-CM | POA: Diagnosis not present

## 2021-06-11 NOTE — Progress Notes (Signed)
Patient ID: Richard Dalton, male   DOB: 05-17-1951, 70 y.o.   MRN: [id number]  This visit type was conducted due to national recommendations for restrictions regarding the COVID-19 pandemic in an effort to limit this patient's exposure and mitigate transmission in our community.   Virtual Visit via Video Note  I connected with Richard Dalton on 06/11/21 at  2:30 PM EDT by a video enabled telemedicine application and verified that I am speaking with the correct person using two identifiers.  Location patient: home Location provider:work or home office Persons participating in the virtual visit: patient, provider  I discussed the limitations of evaluation and management by telemedicine and the availability of in person appointments. The patient expressed understanding and agreed to proceed.   HPI:  Richard Dalton has COVID-19.  He and his wife were traveling on their anniversary out of town last week.  They noticed onset Saturday of nasal congestion, sore throat, malaise, low-grade fever.  They tested positive Sunday.  He states he lost his taste for about 24 hours but this is back now.  His appetite is back.  Overall he feels much better.  Sore throat symptoms have virtually resolved.  Fever was only low-grade with highest around 99.7.  None since last night.  Fatigue improving.  Decrease nasal congestion.  Mild cough.  He has been fully vaccinated.  He has risk factors of age, hypertension, type 2 diabetes, chronic kidney disease.   ROS: See pertinent positives and negatives per HPI.  Past Medical History:  Diagnosis Date   ALLERGIC RHINITIS 11/12/2006   Allergy    Asthma 2009   patient denies this dx   DIABETES MELLITUS, TYPE II 08/07/2006   ECZEMA 11/08/2008   ERECTILE DYSFUNCTION 11/12/2006   GENERALIZED VACCINIA AS COMP MEDICAL CARE NEC 09/09/2010   GERD (gastroesophageal reflux disease)    HYPERLIPIDEMIA 08/07/2006   HYPERTENSION 08/07/2006   Neoplasm of uncertain behavior of skin 02/07/2009    OSTEOARTHRITIS 08/07/2006   hands   PLANTAR FASCIITIS 11/12/2006    Past Surgical History:  Procedure Laterality Date   COLONOSCOPY  08/2015   Perry-polyps   KNEE ARTHROSCOPY     right   TOENAIL EXCISION  10/2019   TONSILLECTOMY AND ADENOIDECTOMY  1955   WISDOM TOOTH EXTRACTION      Family History  Problem Relation Age of Onset   Diabetes Mother    Alzheimer's disease Father        died age 77   Diabetes Sister        14 sisters with diabetes   Diabetes Brother    Diabetes Daughter    Colon cancer Neg Hx    Esophageal cancer Neg Hx    Stomach cancer Neg Hx    Rectal cancer Neg Hx     SOCIAL HX: Ex-smoker   Current Outpatient Medications:    ADVAIR DISKUS 250-50 MCG/ACT AEPB, INHALE 1 PUFF BY MOUTH TWICE DAILY, Disp: 60 each, Rfl: 1   atorvastatin (LIPITOR) 20 MG tablet, TAKE 1 TABLET BY MOUTH DAILY, Disp: 90 tablet, Rfl: 2   chlorpheniramine (CHLOR-TRIMETON) 4 MG tablet, Take 4 mg by mouth every 8 (eight) hours., Disp: , Rfl:    empagliflozin (JARDIANCE) 25 MG TABS tablet, Take 1 tablet (25 mg total) by mouth daily before breakfast., Disp: 90 tablet, Rfl: 1   famotidine (PEPCID) 20 MG tablet, Take 20 mg by mouth daily., Disp: , Rfl:    fluticasone (FLONASE) 50 MCG/ACT nasal spray, Place into both nostrils daily., Disp: ,  Rfl:    IRON PO, Take 65 mg by mouth once., Disp: , Rfl:    levocetirizine (XYZAL) 5 MG tablet, Take 5 mg by mouth every evening., Disp: , Rfl:    lisinopril-hydrochlorothiazide (ZESTORETIC) 10-12.5 MG tablet, TAKE 1 TABLET BY MOUTH DAILY, Disp: 90 tablet, Rfl: 1   metFORMIN (GLUCOPHAGE) 1000 MG tablet, Take 1 tablet (1,000 mg total) by mouth 2 (two) times daily with a meal., Disp: 180 tablet, Rfl: 3   montelukast (SINGULAIR) 10 MG tablet, TAKE 1 TABLET(10 MG) BY MOUTH AT BEDTIME, Disp: 90 tablet, Rfl: 1   OZEMPIC, 0.25 OR 0.5 MG/DOSE, 2 MG/1.5ML SOPN, START 0.25 MG SUBCUTANEOUSLY EVERY WEEK FOR 4 WEEKS AND THEN INCREASE TO 0.5 MG EVERY WEEK, Disp: 4.5 mL,  Rfl: 3   pioglitazone (ACTOS) 15 MG tablet, TAKE 1 TABLET BY MOUTH DAILY, Disp: 90 tablet, Rfl: 1  EXAM:  VITALS per patient if applicable:  GENERAL: alert, oriented, appears well and in no acute distress  HEENT: atraumatic, conjunttiva clear, no obvious abnormalities on inspection of external nose and ears  NECK: normal movements of the head and neck  LUNGS: on inspection no signs of respiratory distress, breathing rate appears normal, no obvious gross SOB, gasping or wheezing  CV: no obvious cyanosis  MS: moves all visible extremities without noticeable abnormality  PSYCH/NEURO: pleasant and cooperative, no obvious depression or anxiety, speech and thought processing grossly intact  ASSESSMENT AND PLAN:  Discussed the following assessment and plan:  COVID-19 day 3 of illness.  Patient significantly improved.  We did discuss potential antiviral therapies.  He declines at this time which seems reasonable given his rapid improvement.  -Plenty of fluids and rest. -Continue over-the-counter medications for his nasal congestive symptoms. -Follow-up immediately for any increased dyspnea or other concerns.     I discussed the assessment and treatment plan with the patient. The patient was provided an opportunity to ask questions and all were answered. The patient agreed with the plan and demonstrated an understanding of the instructions.   The patient was advised to call back or seek an in-person evaluation if the symptoms worsen or if the condition fails to improve as anticipated.     Carolann Littler, MD

## 2021-06-19 ENCOUNTER — Encounter: Payer: Self-pay | Admitting: Family Medicine

## 2021-06-26 DIAGNOSIS — D631 Anemia in chronic kidney disease: Secondary | ICD-10-CM | POA: Diagnosis not present

## 2021-06-26 DIAGNOSIS — N1831 Chronic kidney disease, stage 3a: Secondary | ICD-10-CM | POA: Diagnosis not present

## 2021-06-26 DIAGNOSIS — I129 Hypertensive chronic kidney disease with stage 1 through stage 4 chronic kidney disease, or unspecified chronic kidney disease: Secondary | ICD-10-CM | POA: Diagnosis not present

## 2021-06-26 DIAGNOSIS — N2581 Secondary hyperparathyroidism of renal origin: Secondary | ICD-10-CM | POA: Diagnosis not present

## 2021-06-26 DIAGNOSIS — N183 Chronic kidney disease, stage 3 unspecified: Secondary | ICD-10-CM | POA: Diagnosis not present

## 2021-07-18 DIAGNOSIS — H524 Presbyopia: Secondary | ICD-10-CM | POA: Diagnosis not present

## 2021-07-18 LAB — HM DIABETES EYE EXAM

## 2021-07-24 ENCOUNTER — Encounter: Payer: Self-pay | Admitting: Family Medicine

## 2021-08-05 ENCOUNTER — Other Ambulatory Visit: Payer: Self-pay | Admitting: Family Medicine

## 2021-08-13 ENCOUNTER — Other Ambulatory Visit: Payer: Self-pay

## 2021-08-14 ENCOUNTER — Ambulatory Visit (INDEPENDENT_AMBULATORY_CARE_PROVIDER_SITE_OTHER): Payer: Medicare Other | Admitting: Family Medicine

## 2021-08-14 VITALS — BP 120/58 | HR 93 | Temp 98.4°F | Wt 211.7 lb

## 2021-08-14 DIAGNOSIS — E1165 Type 2 diabetes mellitus with hyperglycemia: Secondary | ICD-10-CM | POA: Diagnosis not present

## 2021-08-14 DIAGNOSIS — N1831 Chronic kidney disease, stage 3a: Secondary | ICD-10-CM

## 2021-08-14 DIAGNOSIS — E785 Hyperlipidemia, unspecified: Secondary | ICD-10-CM | POA: Diagnosis not present

## 2021-08-14 DIAGNOSIS — I10 Essential (primary) hypertension: Secondary | ICD-10-CM

## 2021-08-14 LAB — POCT GLYCOSYLATED HEMOGLOBIN (HGB A1C): Hemoglobin A1C: 7.5 % — AB (ref 4.0–5.6)

## 2021-08-14 MED ORDER — OZEMPIC (1 MG/DOSE) 2 MG/1.5ML ~~LOC~~ SOPN: 1.0000 mg | PEN_INJECTOR | SUBCUTANEOUS | 3 refills | Status: DC

## 2021-08-14 NOTE — Progress Notes (Signed)
Established Patient Office Visit  Subjective:  Patient ID: Richard Dalton, male    DOB: 01-28-1951  Age: 70 y.o. MRN: [id number]  CC:  Chief Complaint  Patient presents with   Follow-up    HPI [name] presents for medical follow-up.  He has type 2 diabetes.  Is currently on combination therapy including Jardiance, Ozempic, metformin, Actos.  Ozempic doses 0.5 mg subcutaneous once weekly.  Tolerating well without side effects.  He had GI side effects when he was on metformin twice daily and is actually taking that once daily.  He is on maximum dose of Jardiance.  Last A1c 7.3%  Chronic kidney disease.  We referred to nephrology recently.  Recent labs there including CMP and CBC.  His GFR had actually improved to around 60.  No changes were made in his medications.  Blood pressure remains well controlled with lisinopril HCTZ.  No recent dizziness.  No chest pains.  He walks his dog for exercise.  He has hyperlipidemia treated with atorvastatin.  Lipids were checked last February and stable.  Past Medical History:  Diagnosis Date   ALLERGIC RHINITIS 11/12/2006   Allergy    Asthma 2009   patient denies this dx   DIABETES MELLITUS, TYPE II 08/07/2006   ECZEMA 11/08/2008   ERECTILE DYSFUNCTION 11/12/2006   GENERALIZED VACCINIA AS COMP MEDICAL CARE NEC 09/09/2010   GERD (gastroesophageal reflux disease)    HYPERLIPIDEMIA 08/07/2006   HYPERTENSION 08/07/2006   Neoplasm of uncertain behavior of skin 02/07/2009   OSTEOARTHRITIS 08/07/2006   hands   PLANTAR FASCIITIS 11/12/2006    Past Surgical History:  Procedure Laterality Date   COLONOSCOPY  08/2015   [place]-polyps   KNEE ARTHROSCOPY     right   TOENAIL EXCISION  10/2019   TONSILLECTOMY AND ADENOIDECTOMY  1955   WISDOM TOOTH EXTRACTION      Family History  Problem Relation Age of Onset   Diabetes Mother    Alzheimer's disease Father        died age 71   Diabetes Sister        54 sisters with diabetes   Diabetes Brother     Diabetes Daughter    Colon cancer Neg Hx    Esophageal cancer Neg Hx    Stomach cancer Neg Hx    Rectal cancer Neg Hx     Social History   Socioeconomic History   Marital status: Married    Spouse name: Not on file   Number of children: Not on file   Years of education: Not on file   Highest education level: Not on file  Occupational History    Comment: retired  Tobacco Use   Smoking status: Former    Packs/day: 2.00    Years: 20.00    Pack years: 40.00    Types: Cigarettes    Quit date: 03/10/1987    Years since quitting: 34.4   Smokeless tobacco: Never  Vaping Use   Vaping Use: Never used  Substance and Sexual Activity   Alcohol use: Yes    Alcohol/week: 2.0 standard drinks    Types: 2 Standard drinks or equivalent per week   Drug use: No   Sexual activity: Not on file  Other Topics Concern   Not on file  Social History Narrative   Not on file   Social Determinants of Health   Financial Resource Strain: Low Risk    Difficulty of Paying Living Expenses: Not hard at all  Food Insecurity: No  Food Insecurity   Worried About Charity fundraiser in the Last Year: Never true   Ran Out of Food in the Last Year: Never true  Transportation Needs: No Transportation Needs   Lack of Transportation (Medical): No   Lack of Transportation (Non-Medical): No  Physical Activity: Insufficiently Active   Days of Exercise per Week: 3 days   Minutes of Exercise per Session: 30 min  Stress: No Stress Concern Present   Feeling of Stress : Not at all  Social Connections: Moderately Integrated   Frequency of Communication with Friends and Family: More than three times a week   Frequency of Social Gatherings with Friends and Family: More than three times a week   Attends Religious Services: Never   Marine scientist or Organizations: Yes   Attends Music therapist: 1 to 4 times per year   Marital Status: Married  Human resources officer Violence: Not At Risk   Fear of  Current or Ex-Partner: No   Emotionally Abused: No   Physically Abused: No   Sexually Abused: No    Outpatient Medications Prior to Visit  Medication Sig Dispense Refill   ADVAIR DISKUS 250-50 MCG/ACT AEPB INHALE 1 PUFF BY MOUTH TWICE DAILY 60 each 1   atorvastatin (LIPITOR) 20 MG tablet TAKE 1 TABLET BY MOUTH DAILY 90 tablet 2   chlorpheniramine (CHLOR-TRIMETON) 4 MG tablet Take 4 mg by mouth every 8 (eight) hours.     empagliflozin (JARDIANCE) 25 MG TABS tablet Take 1 tablet (25 mg total) by mouth daily before breakfast. 90 tablet 1   famotidine (PEPCID) 20 MG tablet Take 20 mg by mouth daily.     fluticasone (FLONASE) 50 MCG/ACT nasal spray Place into both nostrils daily.     IRON PO Take 65 mg by mouth once.     levocetirizine (XYZAL) 5 MG tablet Take 5 mg by mouth every evening.     lisinopril-hydrochlorothiazide (ZESTORETIC) 10-12.5 MG tablet TAKE 1 TABLET BY MOUTH DAILY 90 tablet 1   metFORMIN (GLUCOPHAGE) 1000 MG tablet Take 1 tablet (1,000 mg total) by mouth 2 (two) times daily with a meal. (Patient taking differently: Take 1,000 mg by mouth daily with breakfast.) 180 tablet 3   montelukast (SINGULAIR) 10 MG tablet TAKE 1 TABLET(10 MG) BY MOUTH AT BEDTIME 90 tablet 1   pioglitazone (ACTOS) 15 MG tablet TAKE 1 TABLET BY MOUTH DAILY 90 tablet 1   OZEMPIC, 0.25 OR 0.5 MG/DOSE, 2 MG/1.5ML SOPN START 0.25 MG SUBCUTANEOUSLY EVERY WEEK FOR 4 WEEKS AND THEN INCREASE TO 0.5 MG EVERY WEEK (Patient taking differently: Inject 1 mg into the skin once a week.) 4.5 mL 3   No facility-administered medications prior to visit.    Allergies  Allergen Reactions   Tetracycline     REACTION: rash    ROS Review of Systems  Constitutional:  Negative for fatigue and unexpected weight change.  Eyes:  Negative for visual disturbance.  Respiratory:  Negative for cough, chest tightness and shortness of breath.   Cardiovascular:  Negative for chest pain, palpitations and leg swelling.  Endocrine:  Negative for polydipsia and polyuria.  Neurological:  Negative for dizziness, syncope, weakness, light-headedness and headaches.     Objective:    Physical Exam Constitutional:      Appearance: He is well-developed.  Neck:     Thyroid: No thyromegaly.  Cardiovascular:     Rate and Rhythm: Normal rate and regular rhythm.  Pulmonary:     Effort: Pulmonary  effort is normal. No respiratory distress.     Breath sounds: Normal breath sounds. No wheezing or rales.  Musculoskeletal:     Cervical back: Neck supple.     Right lower leg: No edema.     Left lower leg: No edema.  Neurological:     Mental Status: He is alert and oriented to person, place, and time.    BP (!) 120/58 (BP Location: Left Arm, Patient Position: Sitting, Cuff Size: Normal)   Pulse 93   Temp 98.4 F (36.9 C) (Oral)   Wt 211 lb 11.2 oz (96 kg)   SpO2 99%   BMI 32.19 kg/m  Wt Readings from Last 3 Encounters:  08/14/21 211 lb 11.2 oz (96 kg)  05/14/21 212 lb 6.4 oz (96.3 kg)  02/11/21 217 lb (98.4 kg)     Health Maintenance Due  Topic Date Due   COVID-19 Vaccine (5 - Booster for Moderna series) 09/12/2020   Zoster Vaccines- Shingrix (2 of 2) 07/21/2021    There are no preventive care reminders to display for this patient.  Lab Results  Component Value Date   TSH 1.60 11/20/2017   Lab Results  Component Value Date   WBC 5.6 11/20/2017   HGB 15.2 11/20/2017   HCT 45.8 11/20/2017   MCV 94.2 11/20/2017   PLT 231.0 11/20/2017   Lab Results  Component Value Date   NA 139 11/13/2020   K 4.2 11/13/2020   CO2 35 (H) 11/13/2020   GLUCOSE 132 (H) 11/13/2020   BUN 24 (H) 11/13/2020   CREATININE 1.52 (H) 11/13/2020   BILITOT 0.6 11/13/2020   ALKPHOS 92 11/13/2020   AST 17 11/13/2020   ALT 20 11/13/2020   PROT 6.7 11/13/2020   ALBUMIN 4.2 11/13/2020   CALCIUM 9.4 11/13/2020   GFR 45.97 (L) 11/13/2020   Lab Results  Component Value Date   CHOL 160 11/13/2020   Lab Results  Component Value  Date   HDL 50.30 11/13/2020   Lab Results  Component Value Date   LDLCALC 76 11/13/2020   Lab Results  Component Value Date   TRIG 166.0 (H) 11/13/2020   Lab Results  Component Value Date   CHOLHDL 3 11/13/2020   Lab Results  Component Value Date   HGBA1C 7.5 (A) 08/14/2021      Assessment & Plan:   #1 type 2 diabetes suboptimal control with A1c 7.5%.  We will titrate Ozempic to 1 mg subcutaneous once weekly with prescription rewritten.  Recheck in 3 months  #2 hypertension stable and well-controlled.  Continue lisinopril HCTZ  #3 chronic kidney disease stage III.  Numbers are actually improved with recent renal profile through nephrology.  Continue close follow-up with nephrology  #4 dyslipidemia.  Patient on atorvastatin.  Tolerating well.  Continue current dose of atorvastatin and recheck lipids at 78-month follow-up   Meds ordered this encounter  Medications   Semaglutide, 1 MG/DOSE, (OZEMPIC, 1 MG/DOSE,) 2 MG/1.5ML SOPN    Sig: Inject 1 mg into the skin once a week.    Dispense:  9 mL    Refill:  3    Follow-up: Return in about 3 months (around 11/14/2021).    Carolann Littler, MD

## 2021-08-14 NOTE — Patient Instructions (Signed)
Go ahead and increase the Ozempic to 1 mg [place] weekly

## 2021-08-15 ENCOUNTER — Other Ambulatory Visit: Payer: Self-pay | Admitting: Internal Medicine

## 2021-09-09 ENCOUNTER — Ambulatory Visit
Admission: EM | Admit: 2021-09-09 | Discharge: 2021-09-09 | Disposition: A | Discharge status: Home / Self Care | Payer: Medicare Other | Attending: Emergency Medicine | Admitting: Emergency Medicine

## 2021-09-09 ENCOUNTER — Other Ambulatory Visit: Payer: Self-pay

## 2021-09-09 DIAGNOSIS — H6122 Impacted cerumen, left ear: Secondary | ICD-10-CM | POA: Diagnosis not present

## 2021-09-09 NOTE — ED Triage Notes (Signed)
Pt presents with c/o L ear blockage x 1 week. Pt states he was recently sick and states he has a hx of ear fullness.

## 2021-09-09 NOTE — ED Provider Notes (Addendum)
[facility]    CSN: [id number] Arrival date & time: 09/09/21  1027    HISTORY   Chief Complaint  Patient presents with   Ear Fullness   HPI Richard Dalton is a 70 y.o. male. Pt presents with c/o L ear blockage x 1 week. Pt states he was recently sick and states he has a hx of ear fullness.  Patient states he is attempted to clean it multiple times with Q-tips.  Patient states he has also been putting over-the-counter earwax removal drops in his ears and has attempted to flush it with a bulb syringe several times, even had his wife attempted but has not been able to remove all the wax in his ears.  Patient states he got a little bit out but not enough to relieve the sensation of fullness that he is currently experiencing.  Patient denies pain in ear, loss of hearing.  The history is provided by the patient.  Past Medical History:  Diagnosis Date   ALLERGIC RHINITIS 11/12/2006   Allergy    Asthma 2009   patient denies this dx   DIABETES MELLITUS, TYPE II 08/07/2006   ECZEMA 11/08/2008   ERECTILE DYSFUNCTION 11/12/2006   GENERALIZED VACCINIA AS COMP MEDICAL CARE NEC 09/09/2010   GERD (gastroesophageal reflux disease)    HYPERLIPIDEMIA 08/07/2006   HYPERTENSION 08/07/2006   Neoplasm of uncertain behavior of skin 02/07/2009   OSTEOARTHRITIS 08/07/2006   hands   PLANTAR FASCIITIS 11/12/2006   Patient Active Problem List   Diagnosis Date Noted   CKD (chronic kidney disease) stage 3, GFR 30-59 ml/min ([place]) 03/14/2020   Obesity (BMI 30-39.9) 09/14/2013   GENERALIZED VACCINIA AS COMP MEDICAL CARE NEC 09/09/2010   NEOPLASM OF UNCERTAIN BEHAVIOR OF SKIN 02/07/2009   ECZEMA 11/08/2008   EXTRINSIC ASTHMA, UNSPECIFIED 05/11/2008   ERECTILE DYSFUNCTION 11/12/2006   ALLERGIC RHINITIS 11/12/2006   Type 2 diabetes mellitus with hyperglycemia ([place]) 08/07/2006   Hyperlipidemia 08/07/2006   Essential hypertension 08/07/2006   Osteoarthritis 08/07/2006   Past Surgical History:  Procedure  Laterality Date   COLONOSCOPY  08/2015   Perry-polyps   KNEE ARTHROSCOPY     right   TOENAIL EXCISION  10/2019   TONSILLECTOMY AND ADENOIDECTOMY  1955   WISDOM TOOTH EXTRACTION      Home Medications    Prior to Admission medications   Medication Sig Start Date End Date Taking? Authorizing Provider  ADVAIR DISKUS 250-50 MCG/ACT AEPB INHALE 1 PUFF BY MOUTH TWICE DAILY 08/05/21   Burchette, Alinda Sierras, MD  atorvastatin (LIPITOR) 20 MG tablet TAKE 1 TABLET BY MOUTH DAILY 12/24/20   Burchette, Alinda Sierras, MD  chlorpheniramine (CHLOR-TRIMETON) 4 MG tablet Take 4 mg by mouth every 8 (eight) hours.    [provider]  empagliflozin (JARDIANCE) 25 MG TABS tablet Take 1 tablet (25 mg total) by mouth daily before breakfast. 03/06/21   Burchette, Alinda Sierras, MD  famotidine (PEPCID) 20 MG tablet Take 20 mg by mouth daily.    [provider]  fluticasone (FLONASE) 50 MCG/ACT nasal spray Place into both nostrils daily.    [provider]  IRON PO Take 65 mg by mouth once.    [provider]  levocetirizine (XYZAL) 5 MG tablet Take 5 mg by mouth every evening.    [provider]  lisinopril-hydrochlorothiazide (ZESTORETIC) 10-12.5 MG tablet TAKE 1 TABLET BY MOUTH DAILY 08/15/21   Burchette, Alinda Sierras, MD  metFORMIN (GLUCOPHAGE) 1000 MG tablet Take 1 tablet (1,000 mg total)  by mouth 2 (two) times daily with a meal. Patient taking differently: Take 1,000 mg by mouth daily with breakfast. 02/11/21   Burchette, Alinda Sierras, MD  montelukast (SINGULAIR) 10 MG tablet TAKE 1 TABLET(10 MG) BY MOUTH AT BEDTIME 02/25/21   Burchette, Alinda Sierras, MD  pioglitazone (ACTOS) 15 MG tablet TAKE 1 TABLET BY MOUTH DAILY 05/13/21   Burchette, Alinda Sierras, MD  Semaglutide, 1 MG/DOSE, (OZEMPIC, 1 MG/DOSE,) 2 MG/1.5ML SOPN Inject 1 mg into the skin once a week. 08/14/21   Burchette, Alinda Sierras, MD   Family History Family History  Problem Relation Age of Onset   Diabetes Mother    Alzheimer's disease Father         died age 28   Diabetes Sister        24 sisters with diabetes   Diabetes Brother    Diabetes Daughter    Colon cancer Neg Hx    Esophageal cancer Neg Hx    Stomach cancer Neg Hx    Rectal cancer Neg Hx    Social History Social History   Tobacco Use   Smoking status: Former    Packs/day: 2.00    Years: 20.00    Pack years: 40.00    Types: Cigarettes    Quit date: 03/10/1987    Years since quitting: 34.5   Smokeless tobacco: Never  Vaping Use   Vaping Use: Never used  Substance Use Topics   Alcohol use: Yes    Alcohol/week: 2.0 standard drinks    Types: 2 Standard drinks or equivalent per week   Drug use: No   Allergies   Tetracycline  Review of Systems Review of Systems Pertinent findings noted in history of present illness.   Physical Exam Triage Vital Signs ED Triage Vitals  Enc Vitals Group     BP 08/02/21 0827 (!) 147/82     Pulse Rate 08/02/21 0827 72     Resp 08/02/21 0827 18     Temp 08/02/21 0827 98.3 F (36.8 C)     Temp Source 08/02/21 0827 Oral     SpO2 08/02/21 0827 98 %     Weight --      Height --      Head Circumference --      Peak Flow --      Pain Score 08/02/21 0826 5     Pain Loc --      Pain Edu? --      Excl. in [place]? --   No data found.  Updated Vital Signs BP 111/71 (BP Location: Left Arm)   Pulse (!) 101   Temp 97.9 F (36.6 C) (Oral)   Resp 17   SpO2 95%   Physical Exam Vitals and nursing note reviewed.  Constitutional:      General: He is not in acute distress.    Appearance: Normal appearance. He is not ill-appearing.  HENT:     Head: Normocephalic and atraumatic.     Right Ear: Hearing and ear canal normal. There is impacted cerumen.     Left Ear: Hearing, tympanic membrane and ear canal normal.     Ears:     Weber exam findings: Does not lateralize. Eyes:     General: Lids are normal.        Right eye: No discharge.        Left eye: No discharge.     Extraocular Movements: Extraocular movements intact.      Conjunctiva/sclera: Conjunctivae normal.  Right eye: Right conjunctiva is not injected.     Left eye: Left conjunctiva is not injected.  Neck:     Trachea: Trachea and phonation normal.  Cardiovascular:     Rate and Rhythm: Normal rate and regular rhythm.     Pulses: Normal pulses.     Heart sounds: Normal heart sounds. No murmur heard.   No friction rub. No gallop.  Pulmonary:     Effort: Pulmonary effort is normal. No accessory muscle usage, prolonged expiration or respiratory distress.     Breath sounds: Normal breath sounds. No stridor, decreased air movement or transmitted upper airway sounds. No decreased breath sounds, wheezing, rhonchi or rales.  Chest:     Chest wall: No tenderness.  Musculoskeletal:        General: Normal range of motion.     Cervical back: Normal range of motion and neck supple. Normal range of motion.  Lymphadenopathy:     Cervical: No cervical adenopathy.  Skin:    General: Skin is warm and dry.     Findings: No erythema or rash.  Neurological:     General: No focal deficit present.     Mental Status: He is alert and oriented to person, place, and time.  Psychiatric:        Mood and Affect: Mood normal.        Behavior: Behavior normal.    Visual Acuity Right Eye Distance:   Left Eye Distance:   Bilateral Distance:    Right Eye Near:   Left Eye Near:    Bilateral Near:     [facility] Couse / Diagnostics / Procedures:    EKG  Radiology No results found.  Procedures Ear Cerumen Removal  Date/Time: 09/09/2021 1:52 PM Performed by: Johna Sheriff, RN Authorized by: Lynden Oxford Scales, PA-C   Consent:    Consent obtained:  Verbal   Consent given by:  Patient   Risks discussed:  Bleeding, TM perforation, incomplete removal, pain, infection and dizziness   Alternatives discussed:  No treatment, delayed treatment, alternative treatment, observation and referral Universal protocol:    Procedure explained and questions answered to  patient or proxy's satisfaction: yes     Patient identity confirmed:  Verbally with patient Procedure details:    Location:  L ear   Procedure type: irrigation     Procedure outcomes: cerumen removed   Post-procedure details:    Inspection:  No bleeding   Hearing quality:  Normal   Procedure completion:  Tolerated Comments:     Initially, provider attempted to remove cerumen with curette/nasal illuminator, cerumen was not removed but solid mass of cerumen was broken into smaller pieces.  Wax was most satisfactorily removed with irrigation, ear canal was again inspected by me and was without any residual cerumen, patient tolerated no complications, ear canal normal in appearance without any maceration or erythema.  Patient advised to discontinue use of Q-tips in ears. (including critical care time)  UC Diagnoses / Final Clinical Impressions(s)   I have reviewed the triage vital signs and the nursing notes.  Pertinent labs & imaging results that were available during my care of the patient were reviewed by me and considered in my medical decision making (see chart for details).   Final diagnoses:  Impacted cerumen of left ear   Patient was discouraged from using Q-tips to clean his ears, recommend only covering tip of finger with Kleenex and wiping ear out after showering.  ED Prescriptions   None  PDMP not reviewed this encounter.  Pending results:  Labs Reviewed - No data to display  Medications Ordered in UC: Medications - No data to display  Disposition Upon Discharge:  Condition: stable for discharge home Home: take medications as prescribed; routine discharge instructions as discussed; follow up as advised.  Patient presented with an acute illness with associated systemic symptoms and significant discomfort requiring urgent management. In my opinion, this is a condition that a prudent lay person (someone who possesses an average knowledge of health and medicine) may  potentially expect to result in complications if not addressed urgently such as respiratory distress, impairment of bodily function or dysfunction of bodily organs.   Routine symptom specific, illness specific and/or disease specific instructions were discussed with the patient and/or caregiver at length.   As such, the patient has been evaluated and assessed, work-up was performed and treatment was provided in alignment with urgent care protocols and evidence based medicine.  Patient/parent/caregiver has been advised that the patient may require follow up for further testing and treatment if the symptoms continue in spite of treatment, as clinically indicated and appropriate.  The patient was tested for COVID-19, Influenza and/or RSV, then the patient/parent/guardian was advised to isolate at home pending the results of his/her diagnostic coronavirus test and potentially longer if they're positive. I have also advised pt that if his/her COVID-19 test returns positive, it's recommended to self-isolate for at least 10 days after symptoms first appeared AND until fever-free for 24 hours without fever reducer AND other symptoms have improved or resolved. Discussed self-isolation recommendations as well as instructions for household member/close contacts as per the [facility] and [place] [facility], and also gave patient the [place] packet with this information.  Patient/parent/caregiver has been advised to return to the [facility] or PCP in 3-5 days if no better; to PCP or the Emergency Department if new signs and symptoms develop, or if the current signs or symptoms continue to change or worsen for further workup, evaluation and treatment as clinically indicated and appropriate  The patient will follow up with their current PCP if and as advised. If the patient does not currently have a PCP we will assist them in obtaining one.   The patient may need specialty follow up if the symptoms continue, in spite of conservative treatment  and management, for further workup, evaluation, consultation and treatment as clinically indicated and appropriate.  Patient/parent/caregiver verbalized understanding and agreement of plan as discussed.  All questions were addressed during visit.  Please see discharge instructions below for further details of plan.  Discharge Instructions:   Discharge Instructions      Please, please, please do not use Q-tips to clean your ears.  Using a Kleenex around the tip of your finger after showering to remove what you can is all that is needed to keep ears clean and healthy.  Thank you for visiting urgent care today.  Please do not hesitate to return for follow-up if you should need anything further.  Per my observation, your right ear canal is clean and free of wax, does not require any attention at this time.        Lynden Oxford Scales, PA-C 09/09/21 1410    Lynden Oxford Scales, [place] 09/09/21 1410

## 2021-09-09 NOTE — Discharge Instructions (Signed)
Please, please, please do not use Q-tips to clean your ears.  Using a Kleenex around the tip of your finger after showering to remove what you can is all that is needed to keep ears clean and healthy.  Thank you for visiting urgent care today.  Please do not hesitate to return for follow-up if you should need anything further.  Per my observation, your right ear canal is clean and free of wax, does not require any attention at this time.

## 2021-09-19 ENCOUNTER — Other Ambulatory Visit: Payer: Self-pay | Admitting: Family Medicine

## 2021-10-06 ENCOUNTER — Encounter: Payer: Self-pay | Admitting: Podiatry

## 2021-10-07 NOTE — Telephone Encounter (Signed)
FYI

## 2021-10-26 ENCOUNTER — Other Ambulatory Visit: Payer: Self-pay | Admitting: Family Medicine

## 2021-10-29 ENCOUNTER — Ambulatory Visit: Payer: Medicare Other | Admitting: Podiatry

## 2021-10-29 ENCOUNTER — Ambulatory Visit (INDEPENDENT_AMBULATORY_CARE_PROVIDER_SITE_OTHER): Payer: Medicare Other

## 2021-10-29 ENCOUNTER — Other Ambulatory Visit: Payer: Self-pay

## 2021-10-29 DIAGNOSIS — M19079 Primary osteoarthritis, unspecified ankle and foot: Secondary | ICD-10-CM

## 2021-10-29 DIAGNOSIS — I739 Peripheral vascular disease, unspecified: Secondary | ICD-10-CM | POA: Diagnosis not present

## 2021-10-29 DIAGNOSIS — I998 Other disorder of circulatory system: Secondary | ICD-10-CM

## 2021-10-29 DIAGNOSIS — M2021 Hallux rigidus, right foot: Secondary | ICD-10-CM | POA: Diagnosis not present

## 2021-10-29 NOTE — Addendum Note (Signed)
Addended by: Hardie Pulley on: 10/29/2021 05:48 PM   Modules accepted: Level of Service

## 2021-10-29 NOTE — Progress Notes (Signed)
°  Subjective:  Patient ID: Richard Dalton, male    DOB: [birth year],  MRN: [id number]  Chief Complaint  Patient presents with   debride    Pt is here for yearly foot care/trim   71 y.o. male presents with the above complaint. History confirmed with patient.  We will also discuss his previous orthotics that he has as well as discussed surgical intervention that was briefly discussed at his last visit.  He still having pain in his right foot.  He is diabetic with last A1c of 7.3 and did not check his sugars in the a.m.  Last saw his PCP [name], MD in December 2012.  He denies pain status today.  Denies numbness tingling burning cramping in his foot.  Objective:  Physical Exam: warm, good capillary refill, nail exam normal nails without lesions, no trophic changes or ulcerative lesions. DP pulses palpable, PT pulses palpable and protective sensation intact Left Foot: normal exam, no swelling, tenderness, instability; ligaments intact, full range of motion of all ankle/foot joints  Right Foot: Pain palpation about the first metatarsal phalangeal joint.  Joint clinically stiff with large dorsal osteophyte  No images are attached to the encounter.  Assessment:   1. Arthritis of big toe   2. Vascular calcification   3. PAD (peripheral artery disease) (HCC)   4. Hallux rigidus of right foot     Plan:  Patient was evaluated and treated and all questions answered.  Diabetes -Courtesy nail debridement today  Hallux rigidus right lower extremity -X-rays taken reviewed.  Significant degeneration of the right first metatarsal phalangeal joint.  He has a large dorsal osteophyte.  Additionally has vascular calcifications.  We discussed possible right first metatarsophalangeal joint replacement however in order to this he would have to have adequate blood flow.  He does have palpable PT and DP pulses but I will order vascular studies to evaluate these further.  Should vascular test be  adequate for perfusion could consider surgical intervention.  He may benefit from vascular invention if the calcifications are severe enough.  Until then recommend he continue to use his orthotics.  I do think he would benefit from some new orthotics but the old ones are not of perfect match to his arch and are slightly worn.  We will follow-up in about a month to review vascular studies to determine whether surgical intervention is safe and warranted  Return in about 4 weeks (around 11/26/2021) for Vascular study f/u, possible surgical planning.

## 2021-11-04 ENCOUNTER — Other Ambulatory Visit: Payer: Self-pay

## 2021-11-04 ENCOUNTER — Ambulatory Visit (HOSPITAL_COMMUNITY)
Admission: RE | Admit: 2021-11-04 | Discharge: 2021-11-04 | Disposition: A | Discharge status: Home / Self Care | Payer: Medicare Other | Source: Ambulatory Visit | Attending: Podiatry | Admitting: Podiatry

## 2021-11-04 DIAGNOSIS — I998 Other disorder of circulatory system: Secondary | ICD-10-CM

## 2021-11-05 ENCOUNTER — Encounter: Payer: Self-pay | Admitting: Podiatry

## 2021-11-06 LAB — HM DIABETES FOOT EXAM: HM Diabetic Foot Exam: NORMAL

## 2021-11-08 ENCOUNTER — Encounter: Payer: Self-pay | Admitting: Family Medicine

## 2021-11-08 NOTE — Telephone Encounter (Signed)
Noted  

## 2021-11-12 ENCOUNTER — Other Ambulatory Visit: Payer: Self-pay | Admitting: Family Medicine

## 2021-11-12 ENCOUNTER — Ambulatory Visit (INDEPENDENT_AMBULATORY_CARE_PROVIDER_SITE_OTHER): Payer: Medicare Other | Admitting: Podiatry

## 2021-11-12 DIAGNOSIS — I739 Peripheral vascular disease, unspecified: Secondary | ICD-10-CM | POA: Diagnosis not present

## 2021-11-12 DIAGNOSIS — I998 Other disorder of circulatory system: Secondary | ICD-10-CM

## 2021-11-12 NOTE — Progress Notes (Signed)
Virtual Visit via Telephone Note  I connected with [name] on 11/12/21 at  1:00 PM EST by telephone and verified that I am speaking with the correct person using two identifiers.  Location: Richard Dalton: Richard Dalton's home Provider: [facility]   I discussed the limitations, risks, security and privacy concerns of performing an evaluation and management service by telephone and the availability of in person appointments. I also discussed with the Richard Dalton that there may be a Richard Dalton responsible charge related to this service. The Richard Dalton expressed understanding and agreed to proceed.  History of Present Illness: Richard Dalton here for discussion of vascular studies.    Observations/Objective: Study Result    LOWER EXTREMITY DOPPLER STUDY   Richard Dalton Name:  Richard Dalton  Date of Exam:   11/04/2021  Medical Rec #: [id number]      Accession #:    [id number]  Date of Birth: Jun 01, 1951      Richard Dalton Gender: M  Richard Dalton Age:   71 years  Exam Location:  [facility]  Procedure:      VAS [place] ABI WITH/WO TBI  Referring Phys: Kinley Dozier    ---------------------------------------------------------------------------  -----     Indications: Calcifications seen on X-ray.   High Risk Factors: Hypertension, hyperlipidemia, Diabetes.     Comparison Study: None   Performing Technologist: Ivan Croft      Examination Guidelines: A complete evaluation includes at minimum, Doppler  waveform signals and systolic blood pressure reading at the level of  bilateral  brachial, anterior tibial, and posterior tibial arteries, when vessel  segments  are accessible. Bilateral testing is considered an integral part of a  complete  examination. Photoelectric Plethysmograph (PPG) waveforms and toe systolic  pressure readings are included as required and additional duplex testing  as  needed. Limited examinations for reoccurring indications may be performed  as  noted.       ABI Findings:  +---------+------------------+-----+---------+--------+   Right     Rt Pressure (mmHg) Index Waveform  Comment    +---------+------------------+-----+---------+--------+   Brachial  129                                           +---------+------------------+-----+---------+--------+   PTA       [place]                       triphasic            +---------+------------------+-----+---------+--------+   DP        161                1.18  triphasic            +---------+------------------+-----+---------+--------+   Great Toe 70                 0.51                       +---------+------------------+-----+---------+--------+   +---------+------------------+-----+---------+-------+   Left      Lt Pressure (mmHg) Index Waveform  Comment   +---------+------------------+-----+---------+-------+   Brachial  137                                          +---------+------------------+-----+---------+-------+  PTA       Towner                       triphasic           +---------+------------------+-----+---------+-------+   DP        146                1.07  triphasic           +---------+------------------+-----+---------+-------+   Great Toe 56                 0.41                      +---------+------------------+-----+---------+-------+   +-------+-----------+-----------+------------+------------+   ABI/TBI Today's ABI Today's TBI Previous ABI Previous TBI   +-------+-----------+-----------+------------+------------+   Right   [place]          0.51                                    +-------+-----------+-----------+------------+------------+   Left              0.41                                    +-------+-----------+-----------+------------+------------+         Arterial wall calcification precludes accurate ankle pressures and ABIs.     Summary:  Right: Resting right ankle-brachial index indicates noncompressible right  lower extremity arteries. The right  toe-brachial index is abnormal.   Left: Resting left ankle-brachial index indicates noncompressible left  lower extremity arteries. The left toe-brachial index is abnormal.      Assessment and Plan: Reviewed vascular studies with Richard Dalton. Discussed that healing is reduced and would pose a risk of healing from surgery. We did discuss seeing a vascular doctor to see if his blood flow could be optimized thus allowing [place] to proceed with surgery. Richard Dalton agrees with plan.  Discussed additional non-operative options including CMO modification and injections, though based upon the minimal joint space the latter may be of little long-term efficacy.  Follow Up Instructions: Refer to vascular surgery - f/u after appointment or message to discuss further.    I discussed the assessment and treatment plan with the Richard Dalton. The Richard Dalton was provided an opportunity to ask questions and all were answered. The Richard Dalton agreed with the plan and demonstrated an understanding of the instructions.   The Richard Dalton was advised to call back or seek an in-person evaluation if the symptoms worsen or if the condition fails to improve as anticipated.  I provided 9 minutes of non-face-to-face time during this encounter. 5 additional minutes spent reviewing vascular studies and imaging prior to encounter, as well as documentation.  Evelina Bucy, DPM

## 2021-11-15 ENCOUNTER — Other Ambulatory Visit: Payer: Self-pay | Admitting: Podiatry

## 2021-11-15 ENCOUNTER — Encounter: Payer: Self-pay | Admitting: Podiatry

## 2021-11-15 ENCOUNTER — Ambulatory Visit (INDEPENDENT_AMBULATORY_CARE_PROVIDER_SITE_OTHER): Payer: Medicare Other | Admitting: Family Medicine

## 2021-11-15 VITALS — BP 130/56 | HR 85 | Temp 97.6°F | Wt 211.6 lb

## 2021-11-15 DIAGNOSIS — E785 Hyperlipidemia, unspecified: Secondary | ICD-10-CM

## 2021-11-15 DIAGNOSIS — K635 Polyp of colon: Secondary | ICD-10-CM | POA: Diagnosis not present

## 2021-11-15 DIAGNOSIS — I998 Other disorder of circulatory system: Secondary | ICD-10-CM

## 2021-11-15 DIAGNOSIS — E1165 Type 2 diabetes mellitus with hyperglycemia: Secondary | ICD-10-CM

## 2021-11-15 DIAGNOSIS — I739 Peripheral vascular disease, unspecified: Secondary | ICD-10-CM

## 2021-11-15 LAB — LIPID PANEL
Cholesterol: 121 mg/dL (ref 0–200)
HDL: 49.2 mg/dL (ref >39.00)
LDL Cholesterol: 55 mg/dL (ref 0–99)
NonHDL: 72.02
Total CHOL/HDL Ratio: 2
Triglycerides: 85 mg/dL (ref 0.0–149.0)
VLDL: 17 mg/dL (ref 0.0–40.0)

## 2021-11-15 LAB — POCT GLYCOSYLATED HEMOGLOBIN (HGB A1C): Hemoglobin A1C: 7.2 % — AB (ref 4.0–5.6)

## 2021-11-15 NOTE — Progress Notes (Signed)
Established Patient Office Visit  Subjective:  Patient ID: Richard Dalton, male    DOB: 05/12/1951  Age: 71 y.o. MRN: [id number]  CC:  Chief Complaint  Patient presents with   Follow-up    HPI Richard Dalton presents for medical follow-up.  He has type 2 diabetes, hypertension, hyperlipidemia.  Last A1c was 7.5%.  We increased his Ozempic from 0.5 mg to 1 mg subcutaneous once weekly.  Tolerating well.  Weight down another pound.  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  Recently saw his podiatrist.  He had arterial Dopplers with noncompressible ABI and reduced TBI on the right 0.51 and left 0.41.  He was referred to vascular surgery and is waiting to hear back from them.  Non-smoker.  No claudication symptoms.  He has history of colon polyps.  Last colonoscopy was 2020.  Has not been contacted and requesting referral back to GI.  No recent change in bowel habits.  Past Medical History:  Diagnosis Date   ALLERGIC RHINITIS 11/12/2006   Allergy    Asthma 2009   patient denies this dx   DIABETES MELLITUS, TYPE II 08/07/2006   ECZEMA 11/08/2008   ERECTILE DYSFUNCTION 11/12/2006   GENERALIZED VACCINIA AS COMP MEDICAL CARE NEC 09/09/2010   GERD (gastroesophageal reflux disease)    HYPERLIPIDEMIA 08/07/2006   HYPERTENSION 08/07/2006   Neoplasm of uncertain behavior of skin 02/07/2009   OSTEOARTHRITIS 08/07/2006   hands   PLANTAR FASCIITIS 11/12/2006    Past Surgical History:  Procedure Laterality Date   COLONOSCOPY  08/2015   [place]-polyps   KNEE ARTHROSCOPY     right   TOENAIL EXCISION  10/2019   TONSILLECTOMY AND ADENOIDECTOMY  1955   WISDOM TOOTH EXTRACTION      Family History  Problem Relation Age of Onset   Diabetes Mother    Alzheimer's disease Father        died age 61   Diabetes Sister        72 sisters with diabetes   Diabetes Brother    Diabetes Daughter    Colon  cancer Neg Hx    Esophageal cancer Neg Hx    Stomach cancer Neg Hx    Rectal cancer Neg Hx     Social History   Socioeconomic History   Marital status: Married    Spouse name: Not on file   Number of children: Not on file   Years of education: Not on file   Highest education level: Bachelor's degree (e.g., BA, AB, BS)  Occupational History    Comment: retired  Tobacco Use   Smoking status: Former    Packs/day: 2.00    Years: 20.00    Pack years: 40.00    Types: Cigarettes    Quit date: 03/10/1987    Years since quitting: 34.7   Smokeless tobacco: Never  Vaping Use   Vaping Use: Never used  Substance and Sexual Activity   Alcohol use: Yes    Alcohol/week: 2.0 standard drinks    Types: 2 Standard drinks or equivalent per week   Drug use: No   Sexual activity: Not on file  Other Topics Concern   Not on file  Social History Narrative   Not on file   Social Determinants of Health   Financial Resource Strain: Low Risk    Difficulty of Paying Living Expenses: Not very hard  Food Insecurity: No  Food Insecurity   Worried About Charity fundraiser in the Last Year: Never true   Ran Out of Food in the Last Year: Never true  Transportation Needs: No Transportation Needs   Lack of Transportation (Medical): No   Lack of Transportation (Non-Medical): No  Physical Activity: Insufficiently Active   Days of Exercise per Week: 4 days   Minutes of Exercise per Session: 20 min  Stress: No Stress Concern Present   Feeling of Stress : Not at all  Social Connections: Unknown   Frequency of Communication with Friends and Family: Three times a week   Frequency of Social Gatherings with Friends and Family: Twice a week   Attends Religious Services: Patient refused   Marine scientist or Organizations: Yes   Attends Music therapist: More than 4 times per year   Marital Status: Married  Human resources officer Violence: Not At Risk   Fear of Current or Ex-Partner: No    Emotionally Abused: No   Physically Abused: No   Sexually Abused: No    Outpatient Medications Prior to Visit  Medication Sig Dispense Refill   ADVAIR DISKUS 250-50 MCG/ACT AEPB INHALE 1 PUFF BY MOUTH TWICE DAILY 60 each 1   atorvastatin (LIPITOR) 20 MG tablet TAKE 1 TABLET BY MOUTH DAILY 90 tablet 2   chlorpheniramine (CHLOR-TRIMETON) 4 MG tablet Take 4 mg by mouth every 8 (eight) hours.     famotidine (PEPCID) 20 MG tablet Take 20 mg by mouth daily.     fluticasone (FLONASE) 50 MCG/ACT nasal spray Place into both nostrils daily.     IRON PO Take 65 mg by mouth once.     JARDIANCE 25 MG TABS tablet TAKE 1 TABLET(25 MG) BY MOUTH DAILY BEFORE AND BREAKFAST 90 tablet 1   levocetirizine (XYZAL) 5 MG tablet Take 5 mg by mouth every evening.     lisinopril-hydrochlorothiazide (ZESTORETIC) 10-12.5 MG tablet TAKE 1 TABLET BY MOUTH DAILY 90 tablet 3   metFORMIN (GLUCOPHAGE) 1000 MG tablet Take 1 tablet (1,000 mg total) by mouth 2 (two) times daily with a meal. (Patient taking differently: Take 1,000 mg by mouth daily with breakfast.) 180 tablet 3   montelukast (SINGULAIR) 10 MG tablet TAKE 1 TABLET(10 MG) BY MOUTH AT BEDTIME 90 tablet 1   pioglitazone (ACTOS) 15 MG tablet TAKE 1 TABLET BY MOUTH DAILY 90 tablet 1   Semaglutide, 1 MG/DOSE, (OZEMPIC, 1 MG/DOSE,) 2 MG/1.5ML SOPN Inject 1 mg into the skin once a week. 9 mL 3   No facility-administered medications prior to visit.    Allergies  Allergen Reactions   Tetracycline     REACTION: rash    ROS Review of Systems  Constitutional:  Negative for fatigue.  Eyes:  Negative for visual disturbance.  Respiratory:  Negative for cough, chest tightness and shortness of breath.   Cardiovascular:  Negative for chest pain, palpitations and leg swelling.  Endocrine: Negative for polydipsia and polyuria.  Neurological:  Negative for dizziness, syncope, weakness, light-headedness and headaches.     Objective:    Physical Exam Constitutional:       Appearance: He is well-developed.  HENT:     Right Ear: External ear normal.     Left Ear: External ear normal.  Eyes:     Pupils: Pupils are equal, round, and reactive to light.  Neck:     Thyroid: No thyromegaly.  Cardiovascular:     Rate and Rhythm: Normal rate and regular rhythm.  Comments: Both feet are warm to touch.  He has fairly good capillary refill in both feet.  Dorsalis pedis pulses are palpable although fainter on the left side. Pulmonary:     Effort: Pulmonary effort is normal. No respiratory distress.     Breath sounds: Normal breath sounds. No wheezing or rales.  Musculoskeletal:     Cervical back: Neck supple.  Neurological:     Mental Status: He is alert and oriented to person, place, and time.    BP (!) 130/56 (BP Location: Left Arm, Patient Position: Sitting, Cuff Size: Normal)    Pulse 85    Temp 97.6 F (36.4 C) (Oral)    Wt 211 lb 9.6 oz (96 kg)    SpO2 100%    BMI 32.17 kg/m  Wt Readings from Last 3 Encounters:  11/15/21 211 lb 9.6 oz (96 kg)  08/14/21 211 lb 11.2 oz (96 kg)  05/14/21 212 lb 6.4 oz (96.3 kg)     Health Maintenance Due  Topic Date Due   Zoster Vaccines- Shingrix (2 of 2) 07/21/2021   FOOT EXAM  10/30/2021    There are no preventive care reminders to display for this patient.  Lab Results  Component Value Date   TSH 1.60 11/20/2017   Lab Results  Component Value Date   WBC 5.6 11/20/2017   HGB 15.2 11/20/2017   HCT 45.8 11/20/2017   MCV 94.2 11/20/2017   PLT 231.0 11/20/2017   Lab Results  Component Value Date   NA 139 11/13/2020   K 4.2 11/13/2020   CO2 35 (H) 11/13/2020   GLUCOSE 132 (H) 11/13/2020   BUN 24 (H) 11/13/2020   CREATININE 1.52 (H) 11/13/2020   BILITOT 0.6 11/13/2020   ALKPHOS 92 11/13/2020   AST 17 11/13/2020   ALT 20 11/13/2020   PROT 6.7 11/13/2020   ALBUMIN 4.2 11/13/2020   CALCIUM 9.4 11/13/2020   GFR 45.97 (L) 11/13/2020   Lab Results  Component Value Date   CHOL 160 11/13/2020   Lab  Results  Component Value Date   HDL 50.30 11/13/2020   Lab Results  Component Value Date   LDLCALC 76 11/13/2020   Lab Results  Component Value Date   TRIG 166.0 (H) 11/13/2020   Lab Results  Component Value Date   CHOLHDL 3 11/13/2020   Lab Results  Component Value Date   HGBA1C 7.2 (A) 11/15/2021      Assessment & Plan:   #1 type 2 diabetes improved with A1c 7.2%.  We discussed further titration but at this point he would like to wait another 3 months as he anticipates increasing his physical activity levels substantially over the next few months.  We will recommend 11-month follow-up  #2 dyslipidemia.  Would like to see his LDL less than 70.  Currently on atorvastatin 20 mg daily.  Recheck lipids and consider further titration of atorvastatin  #3 hypertension stable.  Continue current medications.  #4 peripheral vascular disease with recent studies as above with reduced TBI bilaterally.  No claudication symptoms.  Pending follow-up with vascular surgery   No orders of the defined types were placed in this encounter.   Follow-up: Return in about 3 months (around 02/12/2022).    Carolann Littler, MD

## 2021-11-26 ENCOUNTER — Ambulatory Visit: Payer: Medicare Other | Admitting: Podiatry

## 2021-12-04 ENCOUNTER — Other Ambulatory Visit: Payer: Self-pay

## 2021-12-04 ENCOUNTER — Encounter: Payer: Self-pay | Admitting: Vascular Surgery

## 2021-12-04 ENCOUNTER — Ambulatory Visit: Payer: Medicare Other | Admitting: Vascular Surgery

## 2021-12-04 VITALS — BP 127/74 | HR 85 | Temp 98.4°F | Resp 14 | Ht 69.0 in | Wt 214.6 lb

## 2021-12-04 DIAGNOSIS — I739 Peripheral vascular disease, unspecified: Secondary | ICD-10-CM

## 2021-12-04 NOTE — Progress Notes (Signed)
Vascular and Vein Specialist of [place]  Patient name: Richard Dalton MRN: [id number] DOB: [birth year] Sex: male  REASON FOR CONSULT: Discuss lower extremity arterial disease and abnormal vascular lab studies  HPI: Poseidon Schrauth is a 71 y.o. male, who is here today for discussion of recent noninvasive studies.  He is a very active gentleman walks a great deal and reports no claudication type symptoms.  He has been diagnosed with hallux rigidus of the right great toe and is contemplating surgery for correction of this with Dr. Samuella Cota.  He underwent preoperative noninvasive studies which suggested medial calcification is here for further discussion he does not have any history of arterial rest pain, intermittent claudication, lower extremity tissue loss.  Past Medical History:  Diagnosis Date   ALLERGIC RHINITIS 11/12/2006   Allergy    Asthma 2009   patient denies this dx   DIABETES MELLITUS, TYPE II 08/07/2006   ECZEMA 11/08/2008   ERECTILE DYSFUNCTION 11/12/2006   GENERALIZED VACCINIA AS COMP MEDICAL CARE NEC 09/09/2010   GERD (gastroesophageal reflux disease)    HYPERLIPIDEMIA 08/07/2006   HYPERTENSION 08/07/2006   Neoplasm of uncertain behavior of skin 02/07/2009   OSTEOARTHRITIS 08/07/2006   hands   PLANTAR FASCIITIS 11/12/2006    Family History  Problem Relation Age of Onset   Diabetes Mother    Alzheimer's disease Father        died age 59   Diabetes Sister        4 sisters with diabetes   Diabetes Brother    Diabetes Daughter    Colon cancer Neg Hx    Esophageal cancer Neg Hx    Stomach cancer Neg Hx    Rectal cancer Neg Hx     SOCIAL HISTORY: Social History   Socioeconomic History   Marital status: Married    Spouse name: Not on file   Number of children: Not on file   Years of education: Not on file   Highest education level: Bachelor's degree (e.g., BA, AB, BS)  Occupational History    Comment: retired  Tobacco Use   Smoking  status: Former    Packs/day: 2.00    Years: 20.00    Pack years: 40.00    Types: Cigarettes    Quit date: 03/10/1987    Years since quitting: 34.7   Smokeless tobacco: Never  Vaping Use   Vaping Use: Never used  Substance and Sexual Activity   Alcohol use: Yes    Alcohol/week: 2.0 standard drinks    Types: 2 Standard drinks or equivalent per week   Drug use: No   Sexual activity: Not on file  Other Topics Concern   Not on file  Social History Narrative   Not on file   Social Determinants of Health   Financial Resource Strain: Low Risk    Difficulty of Paying Living Expenses: Not very hard  Food Insecurity: No Food Insecurity   Worried About Running Out of Food in the Last Year: Never true   Ran Out of Food in the Last Year: Never true  Transportation Needs: No Transportation Needs   Lack of Transportation (Medical): No   Lack of Transportation (Non-Medical): No  Physical Activity: Insufficiently Active   Days of Exercise per Week: 4 days   Minutes of Exercise per Session: 20 min  Stress: No Stress Concern Present   Feeling of Stress : Not at all  Social Connections: Unknown   Frequency of Communication with Friends and Family: Three times a  week   Frequency of Social Gatherings with Friends and Family: Twice a week   Attends Religious Services: Patient refused   Database administrator or Organizations: Yes   Attends Engineer, structural: More than 4 times per year   Marital Status: Married  Catering manager Violence: Not At Risk   Fear of Current or Ex-Partner: No   Emotionally Abused: No   Physically Abused: No   Sexually Abused: No    Allergies  Allergen Reactions   Tetracycline     REACTION: rash    Current Outpatient Medications  Medication Sig Dispense Refill   ADVAIR DISKUS 250-50 MCG/ACT AEPB INHALE 1 PUFF BY MOUTH TWICE DAILY 60 each 1   atorvastatin (LIPITOR) 20 MG tablet TAKE 1 TABLET BY MOUTH DAILY 90 tablet 2   chlorpheniramine  (CHLOR-TRIMETON) 4 MG tablet Take 4 mg by mouth every 8 (eight) hours.     famotidine (PEPCID) 20 MG tablet Take 20 mg by mouth daily.     fluticasone (FLONASE) 50 MCG/ACT nasal spray Place into both nostrils daily.     IRON PO Take 65 mg by mouth once.     JARDIANCE 25 MG TABS tablet TAKE 1 TABLET(25 MG) BY MOUTH DAILY BEFORE AND BREAKFAST 90 tablet 1   levocetirizine (XYZAL) 5 MG tablet Take 5 mg by mouth every evening.     lisinopril-hydrochlorothiazide (ZESTORETIC) 10-12.5 MG tablet TAKE 1 TABLET BY MOUTH DAILY 90 tablet 3   metFORMIN (GLUCOPHAGE) 1000 MG tablet Take 1 tablet (1,000 mg total) by mouth 2 (two) times daily with a meal. (Patient taking differently: Take 1,000 mg by mouth daily with breakfast.) 180 tablet 3   montelukast (SINGULAIR) 10 MG tablet TAKE 1 TABLET(10 MG) BY MOUTH AT BEDTIME 90 tablet 1   pioglitazone (ACTOS) 15 MG tablet TAKE 1 TABLET BY MOUTH DAILY 90 tablet 1   Semaglutide, 1 MG/DOSE, (OZEMPIC, 1 MG/DOSE,) 2 MG/1.5ML SOPN Inject 1 mg into the skin once a week. 9 mL 3   No current facility-administered medications for this visit.    REVIEW OF SYSTEMS:  [X]  denotes positive finding, [ ]  denotes negative finding Cardiac  Comments:  Chest pain or chest pressure:    Shortness of breath upon exertion:    Short of breath when lying flat:    Irregular heart rhythm:        Vascular    Pain in calf, thigh, or hip brought on by ambulation:    Pain in feet at night that wakes you up from your sleep:     Blood clot in your veins:    Leg swelling:         Pulmonary    Oxygen at home:    Productive cough:     Wheezing:         Neurologic    Sudden weakness in arms or legs:     Sudden numbness in arms or legs:     Sudden onset of difficulty speaking or slurred speech:    Temporary loss of vision in one eye:     Problems with dizziness:         Gastrointestinal    Blood in stool:     Vomited blood:         Genitourinary    Burning when urinating:     Blood  in urine:        Psychiatric    Major depression:         Hematologic  Bleeding problems:    Problems with blood clotting too easily:        Skin    Rashes or ulcers:        Constitutional    Fever or chills:      PHYSICAL EXAM: Vitals:   12/04/21 0928  BP: 127/74  Pulse: 85  Resp: 14  Temp: 98.4 F (36.9 C)  TempSrc: Temporal  SpO2: 98%  Weight: 214 lb 9.6 oz (97.3 kg)  Height: 5\' 9"  (1.753 m)    GENERAL: The patient is a well-nourished male, in no acute distress. The vital signs are documented above. CARDIOVASCULAR: 2+ radial pulses bilaterally.  2+ femoral, 2+ popliteal and 2+ dorsalis pedis pulses bilaterally.  1+ left Hyster tibial and I do not palpate a right posterior tibial pulse PULMONARY: There is good air exchange  MUSCULOSKELETAL: There are no major deformities or cyanosis. NEUROLOGIC: No focal weakness or paresthesias are detected. SKIN: There are no ulcers or rashes noted. PSYCHIATRIC: The patient has a normal affect.  DATA:  Noninvasive studies from 11/04/2021 were reviewed with the patient.  This shows noncompressible calcified vessels making ankle arm index unreliable.  He did have diminished toe brachial index bilaterally.  Normal triphasic waveforms bilaterally  MEDICAL ISSUES: I discussed the findings at length with the patient.  He does not have any symptoms suggestive of arterial insufficiency and has normal dorsalis pedis pulses bilaterally.  He has normal triphasic waveforms.  I did discuss his medial calcification most likely related to longstanding history of diabetes.  Explained that this is not normal arterial flow but he has no restriction of flow to the level of his foot.  I explained that in all likelihood he does have microvascular disease but should not have any contraindication from a vascular standpoint for foot surgery if indicated.  He was reassured with this discussion will see [place] again on an as-needed basis   Larina Earthly, MD  [facility] and [facility] Specialists of [facility] [phone] Pager [phone]  Note: Portions of this report may have been transcribed using voice recognition software.  Every effort has been made to ensure accuracy; however, inadvertent computerized transcription errors may still be present.

## 2021-12-11 ENCOUNTER — Encounter: Payer: Self-pay | Admitting: Internal Medicine

## 2021-12-31 IMAGING — US US RENAL
1 series · 14 of 25 positions shown · non-contrast
Comparison: None.

CLINICAL DATA: Chronic kidney disease, stage III A.

EXAM:
RENAL / URINARY TRACT ULTRASOUND COMPLETE

[Series 1: us renal · 0.23mm/px · 14 of 35 slices shown]
[im 1/35]
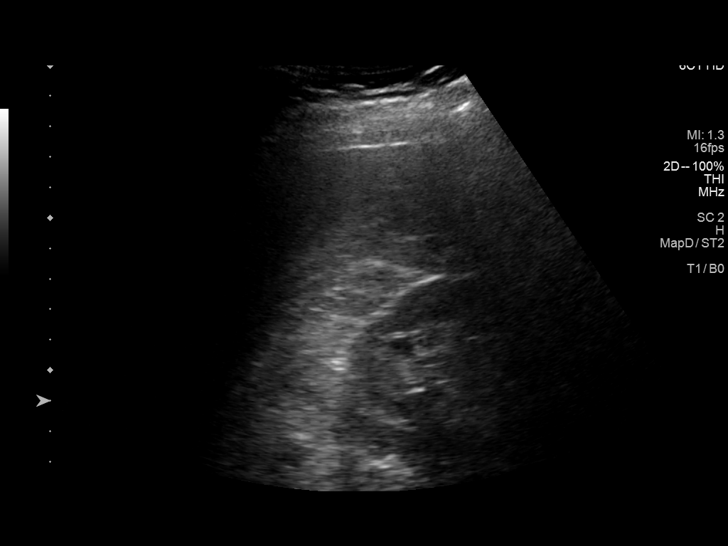
[im 3/35]
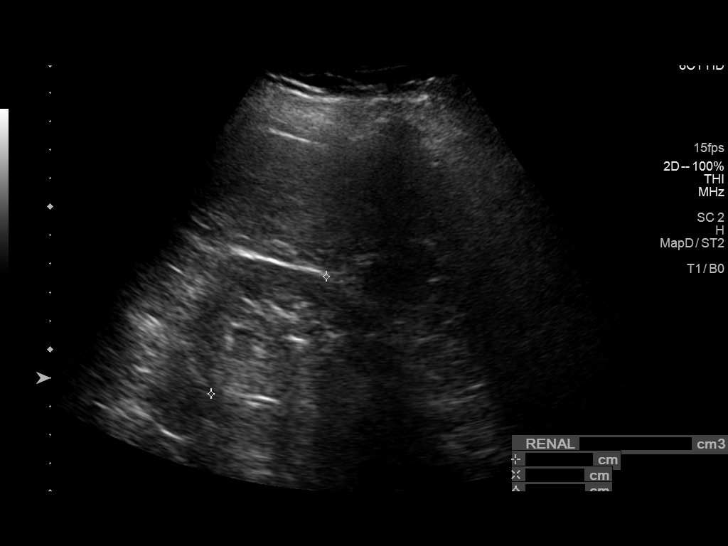
[im 6/35]
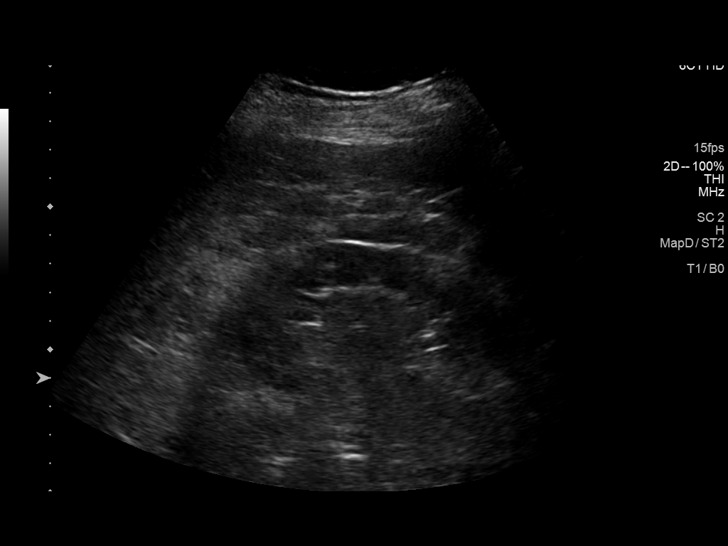
[im 9/35]
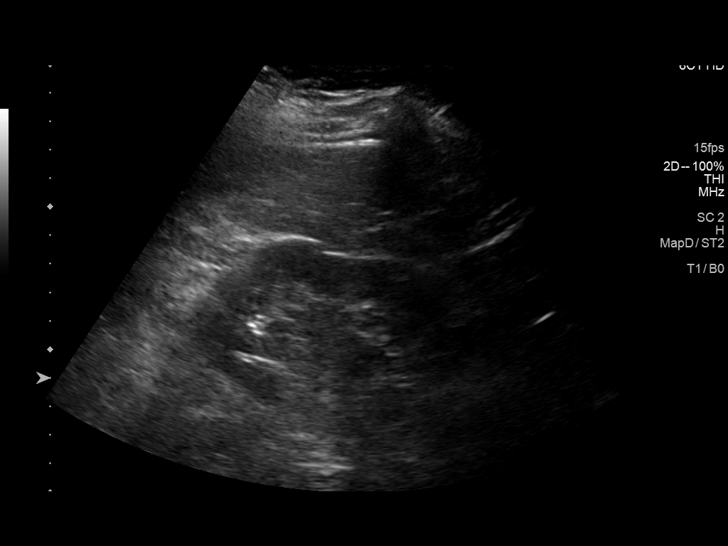
[im 12/35]
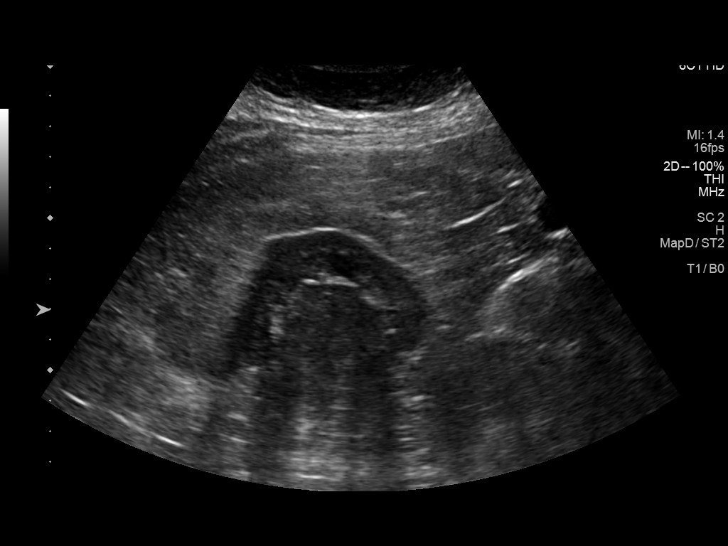
[im 13/35]
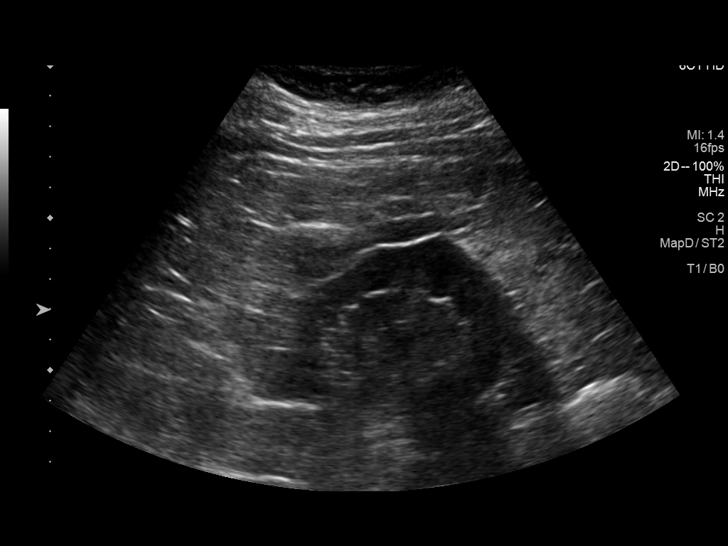
[im 16/35]
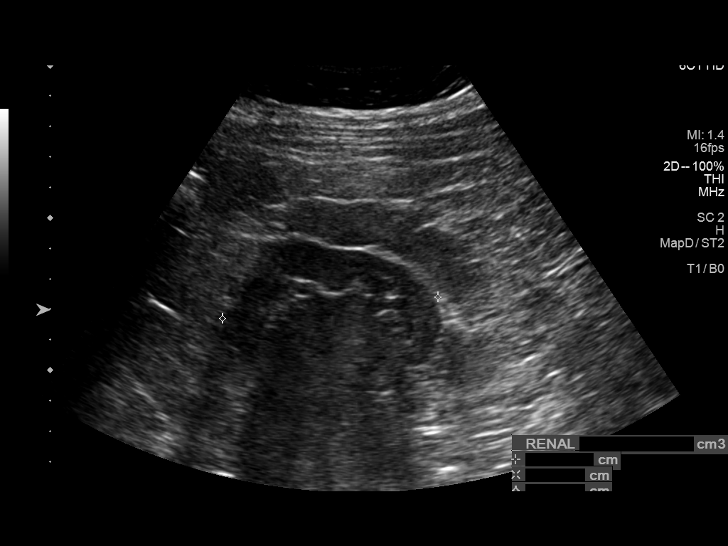
[im 19/35]
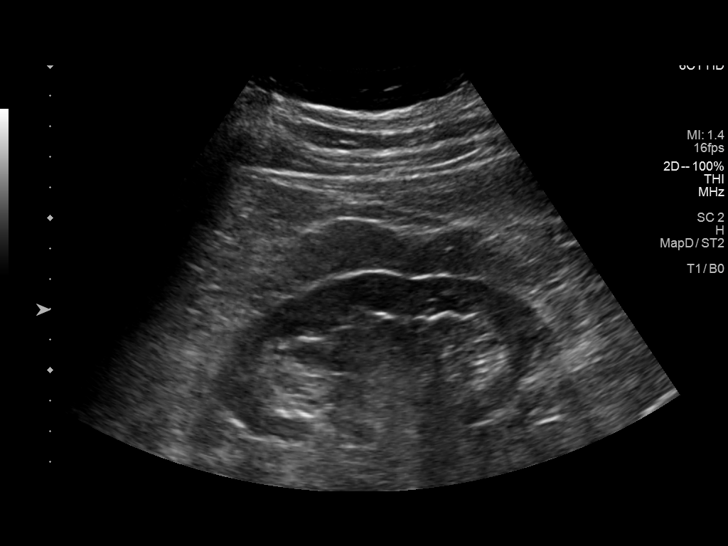
[im 22/35]
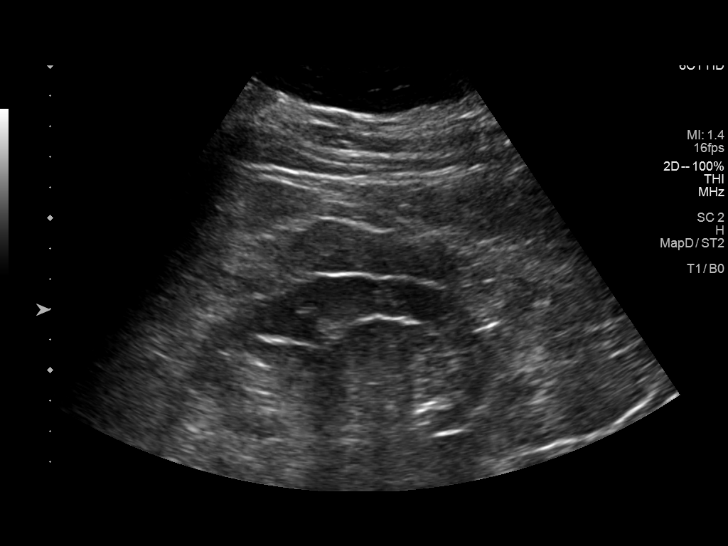
[im 23/35]
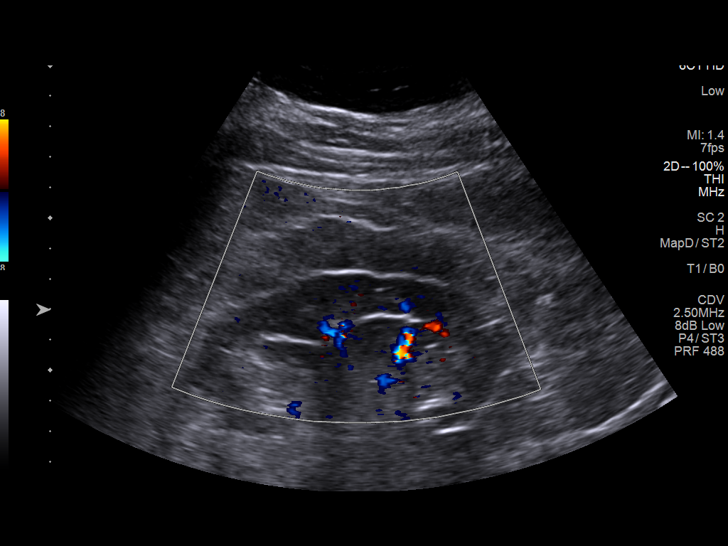
[im 26/35]
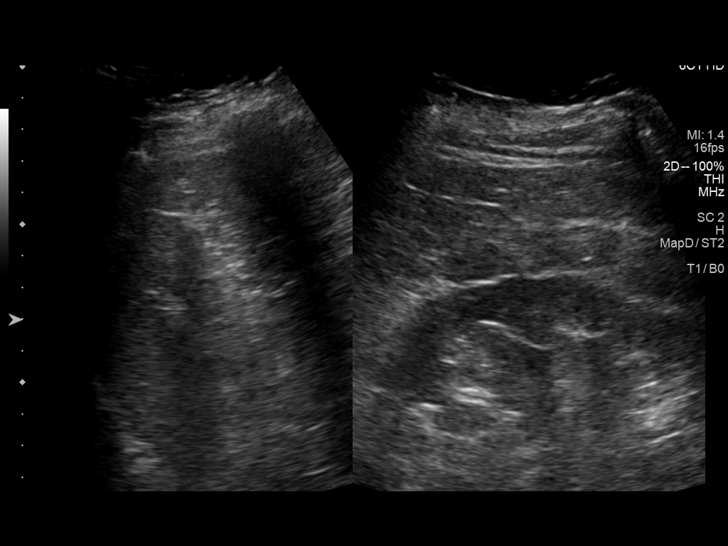
[im 29/35]
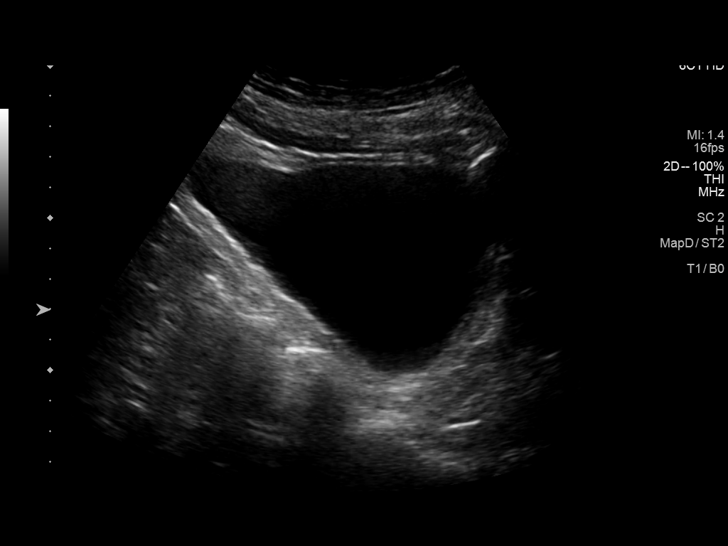
[im 32/35]
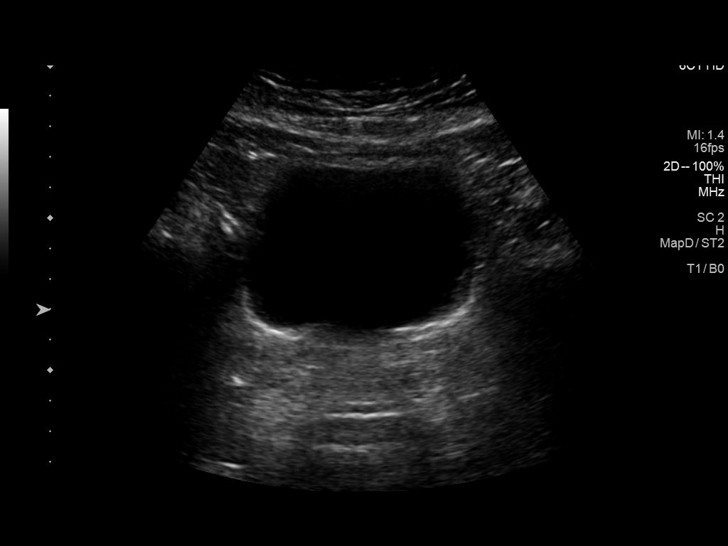
[im 35/35]
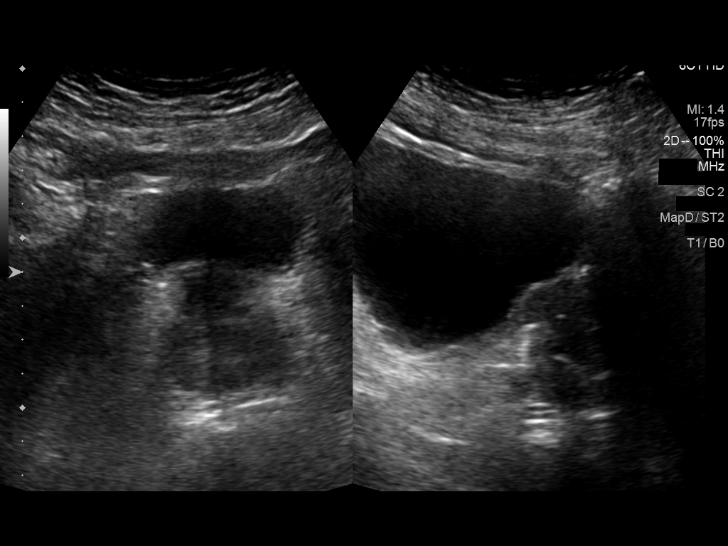

[14 of 25 positions shown; findings below may reference images not displayed]

FINDINGS: Right Kidney:

Renal measurements: 12.2 x 5.8 x 5.8 cm = volume: 213.1 mL.
Echogenicity within normal limits. No mass or hydronephrosis
visualized.

Left Kidney:

Renal measurements: 12.0 x 5.0 x 7.1 cm = volume: 221.4 mL.
Echogenicity within normal limits. No mass or hydronephrosis
visualized.

Bladder:

The bladder appears normal for its degree of distention. A right
ureteral jet is visualized.

Other:

The prostate gland appears mildly enlarged with an estimated volume
of 34.1 cc.
IMPRESSION: Both kidneys are normal in size without hydronephrosis or
significant cortical thinning.

## 2022-01-03 ENCOUNTER — Other Ambulatory Visit: Payer: Self-pay | Admitting: Family Medicine

## 2022-01-17 ENCOUNTER — Encounter: Payer: Self-pay | Admitting: Family Medicine

## 2022-01-19 MED ORDER — ADVAIR DISKUS 250-50 MCG/ACT IN AEPB: 1.0000 | INHALATION_SPRAY | Freq: Two times a day (BID) | RESPIRATORY_TRACT | 11 refills | Status: DC

## 2022-02-02 ENCOUNTER — Encounter: Payer: Self-pay | Admitting: Family Medicine

## 2022-02-04 ENCOUNTER — Ambulatory Visit: Payer: Medicare Other

## 2022-02-09 ENCOUNTER — Encounter: Payer: Self-pay | Admitting: Family Medicine

## 2022-02-10 ENCOUNTER — Encounter: Payer: Self-pay | Admitting: Internal Medicine

## 2022-02-10 ENCOUNTER — Ambulatory Visit: Payer: Medicare Other | Admitting: Podiatrist

## 2022-02-10 ENCOUNTER — Encounter: Payer: Self-pay | Admitting: Podiatrist

## 2022-02-10 ENCOUNTER — Other Ambulatory Visit: Payer: Self-pay | Admitting: *Deleted

## 2022-02-10 DIAGNOSIS — M7751 Other enthesopathy of right foot: Secondary | ICD-10-CM

## 2022-02-10 DIAGNOSIS — M2021 Hallux rigidus, right foot: Secondary | ICD-10-CM

## 2022-02-10 MED ORDER — METFORMIN HCL 1000 MG PO TABS: 1000.0000 mg | ORAL_TABLET | Freq: Two times a day (BID) | ORAL | 3 refills | Status: DC

## 2022-02-10 NOTE — Patient Instructions (Signed)
Call if you would like to discuss a joint surgery for your right foot-  try to come in about a month prior to the date you are considering for surgery. ? ?If you would like orthotics, feel free to call our office and they will put you on the schedule to see Aaron Edelman.  He will get you fixed up. ? ? ?

## 2022-02-10 NOTE — Progress Notes (Signed)
?Chief Complaint  ?Patient presents with  ? Foot Pain  ?  The right great toe has some bone spurs which are getting bigger and there is not any change  ?  ? ?HPI: Patient is 71 y.o. male who presents today for follow up of bone spurs and pain of the right great toe joint.  He was seeing Dr. March Rummage who ordered a vascular study.  Dr. Donnetta Hutching saw him 12/04/21 and advised that he does have adequate blood flow for healing of a foot surgery.  He does have some calcification of vessels but his blood flow is good to his foot overall.  ? ?Patient Active Problem List  ? Diagnosis Date Noted  ? CKD (chronic kidney disease) stage 3, GFR 30-59 ml/min (HCC) 03/14/2020  ? Obesity (BMI 30-39.9) 09/14/2013  ? GENERALIZED VACCINIA AS COMP MEDICAL CARE NEC 09/09/2010  ? NEOPLASM OF UNCERTAIN BEHAVIOR OF SKIN 02/07/2009  ? ECZEMA 11/08/2008  ? EXTRINSIC ASTHMA, UNSPECIFIED 05/11/2008  ? ERECTILE DYSFUNCTION 11/12/2006  ? ALLERGIC RHINITIS 11/12/2006  ? Type 2 diabetes mellitus with hyperglycemia ([place]) 08/07/2006  ? Hyperlipidemia 08/07/2006  ? Essential hypertension 08/07/2006  ? Osteoarthritis 08/07/2006  ? ? ?Current Outpatient Medications on File Prior to Visit  ?Medication Sig Dispense Refill  ? ADVAIR DISKUS 250-50 MCG/ACT AEPB Inhale 1 puff into the lungs 2 (two) times daily. Patient requests brand name only- no generics. 60 each 11  ? atorvastatin (LIPITOR) 20 MG tablet TAKE 1 TABLET BY MOUTH DAILY 90 tablet 2  ? chlorpheniramine (CHLOR-TRIMETON) 4 MG tablet Take 4 mg by mouth every 8 (eight) hours.    ? famotidine (PEPCID) 20 MG tablet Take 20 mg by mouth daily.    ? fluticasone (FLONASE) 50 MCG/ACT nasal spray Place into both nostrils daily.    ? IRON PO Take 65 mg by mouth once.    ? JARDIANCE 25 MG TABS tablet TAKE 1 TABLET(25 MG) BY MOUTH DAILY BEFORE AND BREAKFAST 90 tablet 1  ? levocetirizine (XYZAL) 5 MG tablet Take 5 mg by mouth every evening.    ? lisinopril-hydrochlorothiazide (ZESTORETIC) 10-12.5 MG tablet TAKE 1 TABLET  BY MOUTH DAILY 90 tablet 3  ? metFORMIN (GLUCOPHAGE) 1000 MG tablet Take 1 tablet (1,000 mg total) by mouth 2 (two) times daily with a meal. 180 tablet 3  ? montelukast (SINGULAIR) 10 MG tablet TAKE 1 TABLET(10 MG) BY MOUTH AT BEDTIME 90 tablet 1  ? pioglitazone (ACTOS) 15 MG tablet TAKE 1 TABLET BY MOUTH DAILY 90 tablet 1  ? Semaglutide, 1 MG/DOSE, (OZEMPIC, 1 MG/DOSE,) 2 MG/1.5ML SOPN Inject 1 mg into the skin once a week. 9 mL 3  ? ?No current facility-administered medications on file prior to visit.  ? ? ?Allergies  ?Allergen Reactions  ? Tetracycline   ?  REACTION: rash  ? ? ?Review of Systems ?No fevers, chills, nausea, muscle aches, no difficulty breathing, no calf pain, no chest pain or shortness of breath. ? ? ?Physical Exam ? ?GENERAL APPEARANCE: Alert, conversant. Appropriately groomed. No acute distress.  ? ?VASCULAR: Pedal pulses palpable 2/4 DP and unable to palpate PT right  Capillary refill time is immediate to all digits,  Proximal to distal cooling it warm to warm.  Digital perfusion adequate with digital hair growth noted on the right foot.  ? ?NEUROLOGIC: sensation is intact to 5.07 monofilament at 5/5 sites bilateral.  Light touch is intact bilateral, vibratory sensation intact bilateral ? ?MUSCULOSKELETAL: acceptable muscle strength, tone and stability bilateral.  Large bone  spur on the dorsum of the first metatarsal is noted.  Significantly reduced range of motion of the first MPJ right is noted consistent with hallux limitus/ arthritis.  ? ?DERMATOLOGIC: skin is warm, supple, and dry.  Color, texture, and turgor of skin within normal limits.  No open wounds are noted.  No preulcerative lesions are seen.  Digital nails are asymptomatic.   ? ?Xrays reviewed from his last visit and show significant arthritis at the first MPJ with dorsal spurring noted.  No bunion deformity present.  ? ? ? ?Assessment  ? ?  ICD-10-CM   ?1. Hallux rigidus of right foot  M20.21   ?  ?2. Bone spur of right foot   M77.51   ?  ? ? ? ?Plan ? ?Discussed exam and xray findings with Richard Dalton today.  Discussed in detal his current orthotics with a rigid extension and they look to be in good shape.  Discussed his conservative options and the possibility of steroid injections around the joint should the joint start to be painful . I do not recommend them today as he has minimal pain at the joint today.  Discussed that the bone spurs will likely keep getting larger until he has the surgery on the great toe joint.  He is interested in surgery but would like to wait until he has less travel plans coming up.  He will continue to wear the sketchers walking shoes along with his custom orthotics.  He will call 4-6 weeks prior to an ideal time for surgery if he would like to consider speaking with a surgeon about his foot and options available.  He will also call if he would like new orthotics and he can see Aaron Edelman for these.  If the joint becomes painful, and he would like an injection he will let us know. Otherwise he will be seen back as needed for follow up.  ?

## 2022-02-11 ENCOUNTER — Ambulatory Visit (AMBULATORY_SURGERY_CENTER): Payer: Self-pay

## 2022-02-11 ENCOUNTER — Other Ambulatory Visit: Payer: Self-pay

## 2022-02-11 VITALS — Ht 69.0 in | Wt 215.0 lb

## 2022-02-11 DIAGNOSIS — Z86010 Personal history of colonic polyps: Secondary | ICD-10-CM

## 2022-02-11 MED ORDER — NA SULFATE-K SULFATE-MG SULF 17.5-3.13-1.6 GM/177ML PO SOLN: 1.0000 | Freq: Once | ORAL | 0 refills | Status: AC

## 2022-02-11 NOTE — Progress Notes (Signed)
Denies allergies to eggs or soy products. Denies complication of anesthesia or sedation. Denies use of weight loss medication. Denies use of O2.   Emmi instructions given for colonoscopy.  

## 2022-02-18 ENCOUNTER — Other Ambulatory Visit: Payer: Self-pay | Admitting: Family Medicine

## 2022-03-07 ENCOUNTER — Encounter: Payer: Self-pay | Admitting: Family Medicine

## 2022-03-24 ENCOUNTER — Encounter: Payer: Self-pay | Admitting: Family Medicine

## 2022-03-24 ENCOUNTER — Ambulatory Visit (INDEPENDENT_AMBULATORY_CARE_PROVIDER_SITE_OTHER): Payer: Medicare Other | Admitting: Family Medicine

## 2022-03-24 ENCOUNTER — Ambulatory Visit (INDEPENDENT_AMBULATORY_CARE_PROVIDER_SITE_OTHER): Payer: Medicare Other

## 2022-03-24 VITALS — BP 128/64 | HR 92 | Temp 99.2°F | Ht 69.0 in | Wt 213.3 lb

## 2022-03-24 VITALS — BP 128/60 | HR 94 | Temp 99.2°F | Ht 69.0 in | Wt 213.0 lb

## 2022-03-24 DIAGNOSIS — I10 Essential (primary) hypertension: Secondary | ICD-10-CM | POA: Diagnosis not present

## 2022-03-24 DIAGNOSIS — Z0000 Encounter for general adult medical examination without abnormal findings: Secondary | ICD-10-CM | POA: Diagnosis not present

## 2022-03-24 DIAGNOSIS — E785 Hyperlipidemia, unspecified: Secondary | ICD-10-CM | POA: Diagnosis not present

## 2022-03-24 DIAGNOSIS — E1165 Type 2 diabetes mellitus with hyperglycemia: Secondary | ICD-10-CM | POA: Diagnosis not present

## 2022-03-24 LAB — POCT GLYCOSYLATED HEMOGLOBIN (HGB A1C): Hemoglobin A1C: 7.1 % — AB (ref 4.0–5.6)

## 2022-03-24 NOTE — Progress Notes (Signed)
Established Patient Office Visit  Subjective   Patient ID: Danyal Whitenack, male    DOB: 1951/04/01  Age: 71 y.o. MRN: [id number]  Chief Complaint  Patient presents with   Follow-up    HPI   Aaban is here for routine medical follow-up.  He had Medicare wellness exam earlier today.  He has hypertension, type 2 diabetes, chronic kidney disease, hyperlipidemia.  He is followed by nephrology for his chronic kidney disease and states he has had a renal profile within the past year.  He plans to send those lab results along to [place].  He had lipid panel done in February and lipids at goal with LDL 55.  He has managed to increase his steps about 8000/day over the past couple months.  He has been helping his son with a building project up in [place].  Denies any recent chest pains.  No dizziness.  Compliant with all medications.  Past Medical History:  Diagnosis Date   ALLERGIC RHINITIS 11/12/2006   Allergy    Anemia    Asthma 2009   patient denies this dx   Chronic kidney disease    COPD (chronic obstructive pulmonary disease) ([place])    DIABETES MELLITUS, TYPE II 08/07/2006   ECZEMA 11/08/2008   ERECTILE DYSFUNCTION 11/12/2006   GENERALIZED VACCINIA AS COMP MEDICAL CARE NEC 09/09/2010   GERD (gastroesophageal reflux disease)    Heart murmur    HYPERLIPIDEMIA 08/07/2006   HYPERTENSION 08/07/2006   Neoplasm of uncertain behavior of skin 02/07/2009   OSTEOARTHRITIS 08/07/2006   hands   PLANTAR FASCIITIS 11/12/2006   Sleep apnea    Past Surgical History:  Procedure Laterality Date   COLONOSCOPY  08/2015   [place]-polyps   KNEE ARTHROSCOPY     right   TOENAIL EXCISION  10/2019   TONSILLECTOMY AND ADENOIDECTOMY  1955   WISDOM TOOTH EXTRACTION      reports that he quit smoking about 35 years ago. His smoking use included cigarettes. He has a 40.00 pack-year smoking history. He has never used smokeless tobacco. He reports current alcohol use of about 2.0 standard drinks of alcohol per  week. He reports that he does not use drugs. family history includes Alzheimer's disease in his father; Diabetes in his brother, daughter, mother, and sister. Allergies  Allergen Reactions   Tetracycline     REACTION: rash    Review of Systems  Constitutional:  Negative for malaise/fatigue.  Eyes:  Negative for blurred vision.  Respiratory:  Negative for shortness of breath.   Cardiovascular:  Negative for chest pain.  Gastrointestinal:  Negative for abdominal pain.  Neurological:  Negative for dizziness, weakness and headaches.      Objective:     BP 128/60 (BP Location: Left Arm, Patient Position: Sitting, Cuff Size: Normal)   Pulse 94   Temp 99.2 F (37.3 C) (Oral)   Ht '5\' 9"'$  (1.753 m)   Wt 213 lb (96.6 kg)   SpO2 98%   BMI 31.45 kg/m    Physical Exam Constitutional:      Appearance: He is well-developed.  HENT:     Right Ear: External ear normal.     Left Ear: External ear normal.  Eyes:     Pupils: Pupils are equal, round, and reactive to light.  Neck:     Thyroid: No thyromegaly.  Cardiovascular:     Rate and Rhythm: Normal rate and regular rhythm.  Pulmonary:     Effort: Pulmonary effort is normal. No respiratory distress.  Breath sounds: Normal breath sounds. No wheezing or rales.  Musculoskeletal:     Cervical back: Neck supple.     Right lower leg: No edema.     Left lower leg: No edema.     Comments: Varicose veins prominent left calf.  Nontender.  Neurological:     Mental Status: He is alert and oriented to person, place, and time.      Results for orders placed or performed in visit on 03/24/22  POCT glycosylated hemoglobin (Hb A1C)  Result Value Ref Range   Hemoglobin A1C 7.1 (A) 4.0 - 5.6 %   HbA1c POC (<> result, manual entry)     HbA1c, POC (prediabetic range)     HbA1c, POC (controlled diabetic range)        The ASCVD Risk score (Arnett DK, et al., 2019) failed to calculate for the following reasons:   The valid total  cholesterol range is 130 to 320 mg/dL    Assessment & Plan:   #1 type 2 diabetes stable with A1c 7.1%.  Continue current regimen.  GI symptoms have improved since reducing metformin to once daily.  Increase exercise and set up 44-monthfollow-up  #2 hyperlipidemia.  He remains on Lipitor 20 mg daily.  Lipids were checked in February and stable.  #3 hypertension stable and at goal.  Continue Zestoretic 10/12.5 mg 1 daily  BEulas PostMD [facility]   Return in about 3 months (around 06/24/2022).    BCarolann Littler MD

## 2022-03-24 NOTE — Patient Instructions (Addendum)
Richard Dalton , Thank you for taking time to come for your Medicare Wellness Visit. I appreciate your ongoing commitment to your health goals. Please review the following plan we discussed and let me know if I can assist you in the future.   These are the goals we discussed:  Goals       Patient Stated (pt-stated)      Stay healthy        This is a list of the screening recommended for you and due dates:  Health Maintenance  Topic Date Due   Colon Cancer Screening  03/25/2023*   Flu Shot  05/06/2022   Hemoglobin A1C  05/15/2022   Eye exam for diabetics  07/18/2022   Complete foot exam   11/06/2022   Tetanus Vaccine  06/13/2025   Pneumonia Vaccine  Completed   COVID-19 Vaccine  Completed   Hepatitis C Screening: USPSTF Recommendation to screen - Ages 18-79 yo.  Completed   Zoster (Shingles) Vaccine  Completed   HPV Vaccine  Aged Out  *Topic was postponed. The date shown is not the original due date.   Advanced directives: Yes  Conditions/risks identified: None  Next appointment: Follow up in one year for your annual wellness visit.    Preventive Care 14 Years and Older, Male Preventive care refers to lifestyle choices and visits with your health care provider that can promote health and wellness. What does preventive care include? A yearly physical exam. This is also called an annual well check. Dental exams once or twice a year. Routine eye exams. Ask your health care provider how often you should have your eyes checked. Personal lifestyle choices, including: Daily care of your teeth and gums. Regular physical activity. Eating a healthy diet. Avoiding tobacco and drug use. Limiting alcohol use. Practicing safe sex. Taking low doses of aspirin every day. Taking vitamin and mineral supplements as recommended by your health care provider. What happens during an annual well check? The services and screenings done by your health care provider during your annual well  check will depend on your age, overall health, lifestyle risk factors, and family history of disease. Counseling  Your health care provider may ask you questions about your: Alcohol use. Tobacco use. Drug use. Emotional well-being. Home and relationship well-being. Sexual activity. Eating habits. History of falls. Memory and ability to understand (cognition). Work and work Statistician. Screening  You may have the following tests or measurements: Height, weight, and BMI. Blood pressure. Lipid and cholesterol levels. These may be checked every 5 years, or more frequently if you are over 94 years old. Skin check. Lung cancer screening. You may have this screening every year starting at age 69 if you have a 30-pack-year history of smoking and currently smoke or have quit within the past 15 years. Fecal occult blood test (FOBT) of the stool. You may have this test every year starting at age 42. Flexible sigmoidoscopy or colonoscopy. You may have a sigmoidoscopy every 5 years or a colonoscopy every 10 years starting at age 76. Prostate cancer screening. Recommendations will vary depending on your family history and other risks. Hepatitis C blood test. Hepatitis B blood test. Sexually transmitted disease (STD) testing. Diabetes screening. This is done by checking your blood sugar (glucose) after you have not eaten for a while (fasting). You may have this done every 1-3 years. Abdominal aortic aneurysm (AAA) screening. You may need this if you are a current or former smoker. Osteoporosis. You may be screened starting  at age 49 if you are at high risk. Talk with your health care provider about your test results, treatment options, and if necessary, the need for more tests. Vaccines  Your health care provider may recommend certain vaccines, such as: Influenza vaccine. This is recommended every year. Tetanus, diphtheria, and acellular pertussis (Tdap, Td) vaccine. You may need a Td booster  every 10 years. Zoster vaccine. You may need this after age 70. Pneumococcal 13-valent conjugate (PCV13) vaccine. One dose is recommended after age 50. Pneumococcal polysaccharide (PPSV23) vaccine. One dose is recommended after age 9. Talk to your health care provider about which screenings and vaccines you need and how often you need them. This information is not intended to replace advice given to you by your health care provider. Make sure you discuss any questions you have with your health care provider. Document Released: 10/19/2015 Document Revised: 06/11/2016 Document Reviewed: 07/24/2015 Elsevier Interactive Patient Education  2017 [place] Prevention in the Home Falls can cause injuries. They can happen to people of all ages. There are many things you can do to make your home safe and to help prevent falls. What can I do on the outside of my home? Regularly fix the edges of walkways and driveways and fix any cracks. Remove anything that might make you trip as you walk through a door, such as a raised step or threshold. Trim any bushes or trees on the path to your home. Use bright outdoor lighting. Clear any walking paths of anything that might make someone trip, such as rocks or tools. Regularly check to see if handrails are loose or broken. Make sure that both sides of any steps have handrails. Any raised decks and porches should have guardrails on the edges. Have any leaves, snow, or ice cleared regularly. Use sand or salt on walking paths during winter. Clean up any spills in your garage right away. This includes oil or grease spills. What can I do in the bathroom? Use night lights. Install grab bars by the toilet and in the tub and shower. Do not use towel bars as grab bars. Use non-skid mats or decals in the tub or shower. If you need to sit down in the shower, use a plastic, non-slip stool. Keep the floor dry. Clean up any water that spills on the floor as soon  as it happens. Remove soap buildup in the tub or shower regularly. Attach bath mats securely with double-sided non-slip rug tape. Do not have throw rugs and other things on the floor that can make you trip. What can I do in the bedroom? Use night lights. Make sure that you have a light by your bed that is easy to reach. Do not use any sheets or blankets that are too big for your bed. They should not hang down onto the floor. Have a firm chair that has side arms. You can use this for support while you get dressed. Do not have throw rugs and other things on the floor that can make you trip. What can I do in the kitchen? Clean up any spills right away. Avoid walking on wet floors. Keep items that you use a lot in easy-to-reach places. If you need to reach something above you, use a strong step stool that has a grab bar. Keep electrical cords out of the way. Do not use floor polish or wax that makes floors slippery. If you must use wax, use non-skid floor wax. Do not have throw rugs  and other things on the floor that can make you trip. What can I do with my stairs? Do not leave any items on the stairs. Make sure that there are handrails on both sides of the stairs and use them. Fix handrails that are broken or loose. Make sure that handrails are as long as the stairways. Check any carpeting to make sure that it is firmly attached to the stairs. Fix any carpet that is loose or worn. Avoid having throw rugs at the top or bottom of the stairs. If you do have throw rugs, attach them to the floor with carpet tape. Make sure that you have a light switch at the top of the stairs and the bottom of the stairs. If you do not have them, ask someone to add them for you. What else can I do to help prevent falls? Wear shoes that: Do not have high heels. Have rubber bottoms. Are comfortable and fit you well. Are closed at the toe. Do not wear sandals. If you use a stepladder: Make sure that it is fully  opened. Do not climb a closed stepladder. Make sure that both sides of the stepladder are locked into place. Ask someone to hold it for you, if possible. Clearly mark and make sure that you can see: Any grab bars or handrails. First and last steps. Where the edge of each step is. Use tools that help you move around (mobility aids) if they are needed. These include: Canes. Walkers. Scooters. Crutches. Turn on the lights when you go into a dark area. Replace any light bulbs as soon as they burn out. Set up your furniture so you have a clear path. Avoid moving your furniture around. If any of your floors are uneven, fix them. If there are any pets around you, be aware of where they are. Review your medicines with your doctor. Some medicines can make you feel dizzy. This can increase your chance of falling. Ask your doctor what other things that you can do to help prevent falls. This information is not intended to replace advice given to you by your health care provider. Make sure you discuss any questions you have with your health care provider. Document Released: 07/19/2009 Document Revised: 02/28/2016 Document Reviewed: 10/27/2014 Elsevier Interactive Patient Education  2017 [employer].

## 2022-03-24 NOTE — Progress Notes (Signed)
Subjective:   Richard Dalton is a 70 y.o. male who presents for Medicare Annual/Subsequent preventive examination.  Review of Systems      Cardiac Risk Factors include: advanced age (>31mn, >>39women);diabetes mellitus;hypertension;male gender     Objective:    Today's Vitals   03/24/22 0819  BP: 128/64  Pulse: 92  Temp: 99.2 F (37.3 C)  TempSrc: Oral  SpO2: 98%  Weight: 213 lb 4.8 oz (96.8 kg)  Height: '5\' 9"'  (1.753 m)   Body mass index is 31.5 kg/m.     03/24/2022    8:40 AM 01/29/2021    8:19 AM 08/21/2015   10:14 AM 08/07/2015    3:26 PM  Advanced Directives  Does Patient Have a Medical Advance Directive? Yes Yes Yes No  Type of [place] will Healthcare Power of [place]  Does patient want to make changes to medical advance directive? No - Patient declined     Copy of [place]? Yes - validated most recent copy scanned in chart (See row information) Yes - validated most recent copy scanned in chart (See row information)    Would patient like information on creating a medical advance directive?    No - patient declined information    Current Medications (verified) Outpatient Encounter Medications as of 03/24/2022  Medication Sig   ADVAIR DISKUS 250-50 MCG/ACT AEPB Inhale 1 puff into the lungs 2 (two) times daily. Patient requests brand name only- no generics.   atorvastatin (LIPITOR) 20 MG tablet TAKE 1 TABLET BY MOUTH DAILY   BINAXNOW COVID-19 AG HOME TEST KIT TEST AS DIRECTED TODAY (Patient not taking: Reported on 02/11/2022)   chlorpheniramine (CHLOR-TRIMETON) 4 MG tablet Take 4 mg by mouth every 8 (eight) hours.   famotidine (PEPCID) 20 MG tablet Take 20 mg by mouth daily.   fluticasone (FLONASE) 50 MCG/ACT nasal spray Place into both nostrils daily.   IRON PO Take 65 mg by mouth once.   JARDIANCE 25 MG TABS tablet TAKE 1 TABLET(25 MG) BY MOUTH DAILY BEFORE AND  BREAKFAST   levocetirizine (XYZAL) 5 MG tablet Take 5 mg by mouth every evening.   lisinopril-hydrochlorothiazide (ZESTORETIC) 10-12.5 MG tablet TAKE 1 TABLET BY MOUTH DAILY   metFORMIN (GLUCOPHAGE) 1000 MG tablet Take 1 tablet (1,000 mg total) by mouth 2 (two) times daily with a meal. (Patient taking differently: Take 1,000 mg by mouth daily with breakfast.)   montelukast (SINGULAIR) 10 MG tablet TAKE 1 TABLET(10 MG) BY MOUTH AT BEDTIME   pioglitazone (ACTOS) 15 MG tablet TAKE 1 TABLET BY MOUTH DAILY   Semaglutide, 1 MG/DOSE, (OZEMPIC, 1 MG/DOSE,) 2 MG/1.5ML SOPN Inject 1 mg into the skin once a week.   No facility-administered encounter medications on file as of 03/24/2022.    Allergies (verified) Tetracycline   History: Past Medical History:  Diagnosis Date   ALLERGIC RHINITIS 11/12/2006   Allergy    Anemia    Asthma 2009   patient denies this dx   Chronic kidney disease    COPD (chronic obstructive pulmonary disease) [place]    DIABETES MELLITUS, TYPE II 08/07/2006   ECZEMA 11/08/2008   ERECTILE DYSFUNCTION 11/12/2006   GENERALIZED VACCINIA AS COMP MEDICAL CARE NEC 09/09/2010   GERD (gastroesophageal reflux disease)    Heart murmur    HYPERLIPIDEMIA 08/07/2006   HYPERTENSION 08/07/2006   Neoplasm of uncertain behavior of skin 02/07/2009   OSTEOARTHRITIS 08/07/2006   hands  PLANTAR FASCIITIS 11/12/2006   Sleep apnea    Past Surgical History:  Procedure Laterality Date   COLONOSCOPY  08/2015   [place]-polyps   KNEE ARTHROSCOPY     right   TOENAIL EXCISION  10/2019   TONSILLECTOMY AND ADENOIDECTOMY  1955   WISDOM TOOTH EXTRACTION     Family History  Problem Relation Age of Onset   Diabetes Mother    Alzheimer's disease Father        died age 56   Diabetes Sister        58 sisters with diabetes   Diabetes Brother    Diabetes Daughter    Colon cancer Neg Hx    Esophageal cancer Neg Hx    Stomach cancer Neg Hx    Rectal cancer Neg Hx    Social History    Socioeconomic History   Marital status: Married    Spouse name: Not on file   Number of children: Not on file   Years of education: Not on file   Highest education level: Bachelor's degree (e.g., BA, AB, BS)  Occupational History    Comment: retired  Tobacco Use   Smoking status: Former    Packs/day: 2.00    Years: 20.00    Total pack years: 40.00    Types: Cigarettes    Quit date: 03/10/1987    Years since quitting: 35.0   Smokeless tobacco: Never  Vaping Use   Vaping Use: Never used  Substance and Sexual Activity   Alcohol use: Yes    Alcohol/week: 2.0 standard drinks of alcohol    Types: 2 Standard drinks or equivalent per week    Comment: weekly   Drug use: No   Sexual activity: Not on file  Other Topics Concern   Not on file  Social History Narrative   Not on file   Social Determinants of Health   Financial Resource Strain: Low Risk  (03/24/2022)   Overall Financial Resource Strain (CARDIA)    Difficulty of Paying Living Expenses: Not hard at all  Food Insecurity: No Food Insecurity (03/24/2022)   Hunger Vital Sign    Worried About Running Out of Food in the Last Year: Never true    Ran Out of Food in the Last Year: Never true  Transportation Needs: No Transportation Needs (03/24/2022)   PRAPARE - Hydrologist (Medical): No    Lack of Transportation (Non-Medical): No  Physical Activity: Insufficiently Active (03/24/2022)   Exercise Vital Sign    Days of Exercise per Week: 6 days    Minutes of Exercise per Session: 20 min  Stress: No Stress Concern Present (03/24/2022)   [place]    Feeling of Stress : Not at all  Social Connections: Moderately Integrated (03/24/2022)   Social Connection and Isolation Panel [NHANES]    Frequency of Communication with Friends and Family: More than three times a week    Frequency of Social Gatherings with Friends and Family: More than  three times a week    Attends Religious Services: Never    Marine scientist or Organizations: Yes    Attends Music therapist: More than 4 times per year    Marital Status: Married     Clinical Intake:  Pre-visit preparation completed: NoNutrition Risk Assessment:  Has the patient had any N/V/D within the last 2 months?  No  Does the patient have any non-healing wounds?  No  Has the patient had any unintentional weight loss or weight gain?  No   Diabetes:  Is the patient diabetic?  Yes  If diabetic, was a CBG obtained today?  Yes  CBG 189 Did the patient bring in their glucometer from home?  Yes  How often do you monitor your CBG's? PRN.   Financial Strains and Diabetes Management:  Are you having any financial strains with the device, your supplies or your medication? No .  Does the patient want to be seen by Chronic Care Management for management of their diabetes?  No  Would the patient like to be referred to a Nutritionist or for Diabetic Management?  No   Diabetic Exams:  Diabetic Eye Exam: Completed Yes. Overdue for diabetic eye exam. Pt has been advised about the importance in completing this exam. A referral has been placed today. Message sent to referral coordinator for scheduling purposes. Advised pt to expect a call from office referred to regarding appt.  Diabetic Foot Exam: Completed Yes. Pt has been advised about the importance in completing this exam. Pt is scheduled for diabetic foot exam on Followd by PCP.    Pain : No/denies pain  Diabetic?  Yes  Activities of Daily Living    03/24/2022    8:39 AM  In your present state of health, do you have any difficulty performing the following activities:  Hearing? 0  Vision? 0  Difficulty concentrating or making decisions? 0  Walking or climbing stairs? 0  Dressing or bathing? 0  Doing errands, shopping? 0  Preparing Food and eating ? N  Using the Toilet? N  In the past six months, have you  accidently leaked urine? N  Do you have problems with loss of bowel control? N  Managing your Medications? N  Managing your Finances? N  Housekeeping or managing your Housekeeping? N    Patient Care Team: [name], MD as PCP - General  Indicate any recent Medical Services you may have received from other than [facility] providers in the past year (date may be approximate).     Assessment:   This is a routine wellness examination for [place].  Hearing/Vision screen Hearing Screening - Comments:: No hearing difficulty Vision Screening - Comments:: Wears glasses. Followed by Dr Delman Cheadle  Dietary issues and exercise activities discussed: Exercise limited by: None identified   Goals Addressed               This Visit's Progress     Patient Stated (pt-stated)        Stay healthy       Depression Screen    03/24/2022    8:37 AM 11/15/2021    7:02 AM 01/29/2021    8:18 AM 10/17/2020    9:54 AM 03/14/2020    7:43 AM 11/22/2018    8:30 AM 08/19/2017    8:21 AM  PHQ 2/9 Scores  PHQ - 2 Score 0 0 0 0 0 0 0  PHQ- 9 Score      0     Fall Risk    03/24/2022    8:40 AM 11/15/2021    7:02 AM 11/11/2021    8:54 AM 01/29/2021    8:20 AM 10/17/2020    9:54 AM  Fall Risk   Falls in the past year? 0 0 0 0 0  Number falls in past yr: 0   0   Injury with Fall? 0   0   Risk for fall due to :  No Fall Risks   Impaired vision   Follow up    Falls prevention discussed     FALL RISK PREVENTION PERTAINING TO THE HOME:  Any stairs in or around the home? Yes  If so, are there any without handrails? No  Home free of loose throw rugs in walkways, pet beds, electrical cords, etc? Yes  Adequate lighting in your home to reduce risk of falls? Yes   ASSISTIVE DEVICES UTILIZED TO PREVENT FALLS:  Life alert? No  Use of a cane, walker or w/c? No  Grab bars in the bathroom? No  Shower chair or bench in shower? Yes  Elevated toilet seat or a handicapped toilet? No   TIMED UP AND GO:  Was the  test performed? Yes .  Length of time to ambulate 10 feet: 5 sec.   Gait steady and fast without use of assistive device  Cognitive Function:        03/24/2022    8:41 AM 01/29/2021    8:23 AM  6CIT Screen  What Year? 0 points 0 points  What month? 0 points 0 points  What time? 0 points   Count back from 20 0 points 0 points  Months in reverse 0 points 0 points  Repeat phrase 0 points 0 points  Total Score 0 points     Immunizations Immunization History  Administered Date(s) Administered   Fluad Quad(high Dose 65+) 06/05/2021   Influenza Split 07/10/2011, 06/04/2013   Influenza Whole 07/02/2006, 08/11/2007, 07/24/2008, 07/05/2010, 06/27/2012   Influenza, High Dose Seasonal PF 06/17/2016, 07/11/2017, 05/28/2019   Influenza-Unspecified 06/03/2014, 06/10/2015, 07/11/2017, 05/22/2018, 05/28/2019, 06/12/2020   Moderna Covid-19 Vaccine Bivalent Booster 20yr & up 08/26/2021   Moderna Sars-Covid-2 Vaccination 10/12/2019, 11/09/2019, 05/20/2020, 07/18/2020   Pneumococcal Conjugate-13 09/13/2014   Pneumococcal Polysaccharide-23 10/07/2007, 10/17/2016   Td 10/07/2003   Tdap 06/14/2015   Zoster Recombinat (Shingrix) 05/26/2001, 05/26/2021   Zoster, Live 11/05/2013    TDAP status: Up to date  Flu Vaccine status: Up to date  Pneumococcal vaccine status: Up to date  Covid-19 vaccine status: Completed vaccines  Qualifies for Shingles Vaccine? Yes   Zostavax completed Yes   Shingrix Completed?: Yes  Screening Tests Health Maintenance  Topic Date Due   COLONOSCOPY (Pts 45-472yrInsurance coverage will need to be confirmed)  03/25/2023 (Originally 11/18/2021)   INFLUENZA VACCINE  05/06/2022   HEMOGLOBIN A1C  05/15/2022   OPHTHALMOLOGY EXAM  07/18/2022   FOOT EXAM  11/06/2022   TETANUS/TDAP  06/13/2025   Pneumonia Vaccine 6535Years old  Completed   COVID-19 Vaccine  Completed   Hepatitis C Screening  Completed   Zoster Vaccines- Shingrix  Completed   HPV VACCINES  Aged Out     Health Maintenance  There are no preventive care reminders to display for this patient.   Colorectal cancer screening: Referral to GI placed Scheduled 04/10/22. Pt aware the office will call re: appt.  Lung Cancer Screening: (Low Dose CT Chest recommended if Age 72-80ears, 30 pack-year currently smoking OR have quit w/in 15years.) does not qualify.     Additional Screening:  Hepatitis C Screening: does qualify; Completed 11/20/17  Vision Screening: Recommended annual ophthalmology exams for early detection of glaucoma and other disorders of the eye. Is the patient up to date with their annual eye exam?  Yes  Who is the provider or what is the name of the office in which the patient attends annual eye exams? Dr GoDelman Cheadlef pt is not established with  a provider, would they like to be referred to a provider to establish care? No .   Dental Screening: Recommended annual dental exams for proper oral hygiene  Community Resource Referral / Chronic Care Management:   CRR required this visit?  No   CCM required this visit?  No      Plan:     I have personally reviewed and noted the following in the patient's chart:   Medical and social history Use of alcohol, tobacco or illicit drugs  Current medications and supplements including opioid prescriptions. Patient is not currently taking opioid prescriptions. Functional ability and status Nutritional status Physical activity Advanced directives List of other physicians Hospitalizations, surgeries, and ER visits in previous 12 months Vitals Screenings to include cognitive, depression, and falls Referrals and appointments  In addition, I have reviewed and discussed with patient certain preventive protocols, quality metrics, and best practice recommendations. A written personalized care plan for preventive services as well as general preventive health recommendations were provided to patient.     Criselda Peaches,  LPN   [id number]   Nurse Notes: None

## 2022-04-02 ENCOUNTER — Encounter: Payer: Self-pay | Admitting: Internal Medicine

## 2022-04-10 ENCOUNTER — Encounter: Payer: Self-pay | Admitting: Internal Medicine

## 2022-04-10 ENCOUNTER — Ambulatory Visit (AMBULATORY_SURGERY_CENTER): Payer: Medicare Other | Admitting: Internal Medicine

## 2022-04-10 VITALS — BP 124/64 | HR 83 | Temp 97.8°F | Resp 13 | Ht 69.0 in | Wt 214.0 lb

## 2022-04-10 DIAGNOSIS — Z86010 Personal history of colonic polyps: Secondary | ICD-10-CM

## 2022-04-10 DIAGNOSIS — D124 Benign neoplasm of descending colon: Secondary | ICD-10-CM | POA: Diagnosis not present

## 2022-04-10 DIAGNOSIS — Z09 Encounter for follow-up examination after completed treatment for conditions other than malignant neoplasm: Secondary | ICD-10-CM

## 2022-04-10 DIAGNOSIS — D122 Benign neoplasm of ascending colon: Secondary | ICD-10-CM

## 2022-04-10 DIAGNOSIS — G4733 Obstructive sleep apnea (adult) (pediatric): Secondary | ICD-10-CM | POA: Diagnosis not present

## 2022-04-10 MED ORDER — SODIUM CHLORIDE 0.9 % IV SOLN: 500.0000 mL | Freq: Once | INTRAVENOUS | Status: DC

## 2022-04-10 NOTE — Progress Notes (Signed)
Pt's states no medical or surgical changes since previsit or office visit. 

## 2022-04-10 NOTE — Op Note (Signed)
[place] Patient Name: Richard Dalton Procedure Date: 04/10/2022 7:06 AM MRN: [id number] Endoscopist: Docia Chuck. Henrene Pastor , MD Age: 71 Referring MD:  Date of Birth: 1950-10-20 Gender: Male Account #: [id number] Procedure:                Colonoscopy with cold snare polypectomy x 2 Indications:              High risk colon cancer surveillance: Personal                            history of multiple (3 or more) adenomas, High risk                            colon cancer surveillance: Personal history of                            sessile serrated colon polyp (10 mm or greater in                            size). Previous examinations 2005, 2016, 2020 Medicines:                Monitored Anesthesia Care Procedure:                Pre-Anesthesia Assessment:                           - Prior to the procedure, a History and Physical                            was performed, and patient medications and                            allergies were reviewed. The patient's tolerance of                            previous anesthesia was also reviewed. The risks                            and benefits of the procedure and the sedation                            options and risks were discussed with the patient.                            All questions were answered, and informed consent                            was obtained. Prior Anticoagulants: The patient has                            taken no previous anticoagulant or antiplatelet                            agents. After reviewing the risks and benefits, the  patient was deemed in satisfactory condition to                            undergo the procedure.                           After obtaining informed consent, the colonoscope                            was passed under direct vision. Throughout the                            procedure, the patient's blood pressure, pulse, and                            oxygen  saturations were monitored continuously. The                            Olympus CF-HQ190L (Serial# [id number]) Colonoscope was                            introduced through the anus and advanced to the the                            cecum, identified by appendiceal orifice and                            ileocecal valve. The ileocecal valve, appendiceal                            orifice, and rectum were photographed. The quality                            of the bowel preparation was good. The colonoscopy                            was performed without difficulty. The patient                            tolerated the procedure well. The bowel preparation                            used was SUPREP via split dose instruction. Scope In: 8:25:38 AM Scope Out: 8:45:12 AM Scope Withdrawal Time: 0 hours 16 minutes 3 seconds  Total Procedure Duration: 0 hours 19 minutes 34 seconds  Findings:                 Two polyps were found in the descending colon and                            ascending colon. The polyps were 2 to 4 mm in size.                           Multiple small and large-mouthed diverticula were  found in the colon.                           The exam was otherwise without abnormality on                            direct and retroflexion views. Complications:            No immediate complications. Estimated blood loss:                            None. Estimated Blood Loss:     Estimated blood loss: none. Impression:               - Two 2 to 4 mm polyps in the descending colon and                            in the ascending colon.                           - Diverticulosis.                           - The examination was otherwise normal on direct                            and retroflexion views.                           - No specimens collected. Recommendation:           - Repeat colonoscopy in 5 years for surveillance.                           - Patient has a  contact number available for                            emergencies. The signs and symptoms of potential                            delayed complications were discussed with the                            patient. Return to normal activities tomorrow.                            Written discharge instructions were provided to the                            patient.                           - Resume previous diet.                           - Continue present medications.                           -  Await pathology results. Docia Chuck. Henrene Pastor, MD 04/10/2022 9:17:26 AM This report has been signed electronically.

## 2022-04-10 NOTE — Progress Notes (Signed)
HISTORY OF PRESENT ILLNESS:  Richard Dalton is a 70 y.o. male with a history of multiple adenomatous colon polyps as well as large sessile serrated polyp.  He presents today for surveillance colonoscopy.  Last examination 2020.  No active complaints  REVIEW OF SYSTEMS:  All non-GI ROS negative. Past Medical History:  Diagnosis Date   ALLERGIC RHINITIS 11/12/2006   Allergy    Anemia    Asthma 2009   patient denies this dx   Chronic kidney disease    COPD (chronic obstructive pulmonary disease) ([place])    DIABETES MELLITUS, TYPE II 08/07/2006   ECZEMA 11/08/2008   ERECTILE DYSFUNCTION 11/12/2006   GENERALIZED VACCINIA AS COMP MEDICAL CARE NEC 09/09/2010   GERD (gastroesophageal reflux disease)    Heart murmur    HYPERLIPIDEMIA 08/07/2006   HYPERTENSION 08/07/2006   Neoplasm of uncertain behavior of skin 02/07/2009   OSTEOARTHRITIS 08/07/2006   hands   PLANTAR FASCIITIS 11/12/2006   Sleep apnea     Past Surgical History:  Procedure Laterality Date   COLONOSCOPY  08/2015   Richard Dalton-polyps   KNEE ARTHROSCOPY     right   TOENAIL EXCISION  10/2019   TONSILLECTOMY AND ADENOIDECTOMY  1955   WISDOM TOOTH EXTRACTION      Social History Richard Dalton  reports that he quit smoking about 35 years ago. His smoking use included cigarettes. He has a 40.00 pack-year smoking history. He has never used smokeless tobacco. He reports current alcohol use of about 2.0 standard drinks of alcohol per week. He reports that he does not use drugs.  family history includes Alzheimer's disease in his father; Diabetes in his brother, daughter, mother, and sister.  Allergies  Allergen Reactions   Tetracycline     REACTION: rash       PHYSICAL EXAMINATION: Vital signs: BP 135/69   Pulse 93   Temp 97.8 F (36.6 C) (Temporal)   Resp 13   Ht '5\' 9"'$  (1.753 m)   Wt 214 lb (97.1 kg)   SpO2 99%   BMI 31.60 kg/m  General: Well-developed, well-nourished, no acute distress HEENT: Sclerae are  anicteric, conjunctiva pink. Oral mucosa intact Lungs: Clear Heart: Regular Abdomen: soft, nontender, nondistended, no obvious ascites, no peritoneal signs, normal bowel sounds. No organomegaly. Extremities: No edema Psychiatric: alert and oriented x3. Cooperative      ASSESSMENT:  1.  Personal history of multiple adenomatous colon polyps and sessile serrated polyp (advanced).  Now for surveillance colonoscopy   PLAN: Surveillance colonoscopy

## 2022-04-10 NOTE — Patient Instructions (Signed)
Handout on polyps and diverticulosis given.  YOU HAD AN ENDOSCOPIC PROCEDURE TODAY AT [facility]:   Refer to the procedure report that was given to you for any specific questions about what was found during the examination.  If the procedure report does not answer your questions, please call your gastroenterologist to clarify.  If you requested that your care partner not be given the details of your procedure findings, then the procedure report has been included in a sealed envelope for you to review at your convenience later.  YOU SHOULD EXPECT: Some feelings of bloating in the abdomen. Passage of more gas than usual.  Walking can help get rid of the air that was put into your GI tract during the procedure and reduce the bloating. If you had a lower endoscopy (such as a colonoscopy or flexible sigmoidoscopy) you may notice spotting of blood in your stool or on the toilet paper. If you underwent a bowel prep for your procedure, you may not have a normal bowel movement for a few days.  Please Note:  You might notice some irritation and congestion in your nose or some drainage.  This is from the oxygen used during your procedure.  There is no need for concern and it should clear up in a day or so.  SYMPTOMS TO REPORT IMMEDIATELY:  Following lower endoscopy (colonoscopy or flexible sigmoidoscopy):  Excessive amounts of blood in the stool  Significant tenderness or worsening of abdominal pains  Swelling of the abdomen that is new, acute  Fever of 100F or higher   For urgent or emergent issues, a gastroenterologist can be reached at any hour by calling [phone]. Do not use MyChart messaging for urgent concerns.    DIET:  We do recommend a small meal at first, but then you may proceed to your regular diet.  Drink plenty of fluids but you should avoid alcoholic beverages for 24 hours.  ACTIVITY:  You should plan to take it easy for the rest of today and you should NOT DRIVE  or use heavy machinery until tomorrow (because of the sedation medicines used during the test).    FOLLOW UP: Our staff will call the number listed on your records the next business day following your procedure.  We will call around 7:15- 8:00 am to check on you and address any questions or concerns that you may have regarding the information given to you following your procedure. If we do not reach you, we will leave a message.  If you develop any symptoms (ie: fever, flu-like symptoms, shortness of breath, cough etc.) before then, please call [phone].  If you test positive for Covid 19 in the 2 weeks post procedure, please call and report this information to [place].    If any biopsies were taken you will be contacted by phone or by letter within the next 1-3 weeks.  Please call us at [phone] if you have not heard about the biopsies in 3 weeks.    SIGNATURES/CONFIDENTIALITY: You and/or your care partner have signed paperwork which will be entered into your electronic medical record.  These signatures attest to the fact that that the information above on your After Visit Summary has been reviewed and is understood.  Full responsibility of the confidentiality of this discharge information lies with you and/or your care-partner.

## 2022-04-10 NOTE — Progress Notes (Signed)
Called to room to assist during endoscopic procedure.  Patient ID and intended procedure confirmed with present staff. Received instructions for my participation in the procedure from the performing physician.  

## 2022-04-10 NOTE — Progress Notes (Signed)
Report to PACU, RN, vss, BBS= Clear.  

## 2022-04-11 ENCOUNTER — Telehealth: Payer: Self-pay

## 2022-04-11 NOTE — Telephone Encounter (Signed)
  Follow up Call-     04/10/2022    7:17 AM  Call back number  Post procedure Call Back phone  # [phone]  Permission to leave phone message Yes     Patient questions:  Do you have a fever, pain , or abdominal swelling? No. Pain Score  0 *  Have you tolerated food without any problems? Yes.    Have you been able to return to your normal activities? Yes.    Do you have any questions about your discharge instructions: Diet   No. Medications  No. Follow up visit  No.  Do you have questions or concerns about your Care? No.  Actions: * If pain score is 4 or above: No action needed, pain <4.

## 2022-04-15 ENCOUNTER — Encounter: Payer: Self-pay | Admitting: Internal Medicine

## 2022-04-16 ENCOUNTER — Encounter: Payer: Self-pay | Admitting: Family Medicine

## 2022-04-16 MED ORDER — EMPAGLIFLOZIN 25 MG PO TABS: ORAL_TABLET | ORAL | 3 refills | Status: DC

## 2022-04-23 ENCOUNTER — Ambulatory Visit
Admission: RE | Admit: 2022-04-23 | Discharge: 2022-04-23 | Disposition: A | Discharge status: Home / Self Care | Payer: Medicare Other | Source: Ambulatory Visit | Attending: Emergency Medicine | Admitting: Emergency Medicine

## 2022-04-23 VITALS — BP 113/71 | HR 97 | Temp 98.2°F | Resp 16

## 2022-04-23 DIAGNOSIS — M549 Dorsalgia, unspecified: Secondary | ICD-10-CM

## 2022-04-23 MED ORDER — BACLOFEN 10 MG PO TABS: 10.0000 mg | ORAL_TABLET | Freq: Three times a day (TID) | ORAL | 0 refills | Status: AC

## 2022-04-23 NOTE — ED Provider Notes (Signed)
[facility]    CSN: [id number] Arrival date & time: 04/23/22  4132    HISTORY   Chief Complaint  Patient presents with   Back Pain    Muscle spasms from fall - Entered by patient   HPI Richard Dalton is a pleasant, 71 y.o. male who presents to urgent care today. Patient states that 2 days ago he was trying to pull the cover over the back of his pickup truck, states that the upturned 5 gallon can that he used to step up to the back of his truck tipped over when he attempted to step down and he fell to the ground landing flat on his back.  Patient states at this time he has pain midthoracic mostly on the right side.  Patient states he has been applying ice and heat alternately as well as Biofreeze with some relief of his pain but then feels that the pain comes right back again.  Patient denies loss of range of motion, numbness tingling in upper or lower extremities.  Patient states he did hit his head however is confident he did not lose consciousness and his daughter immediately came to his aid after seeing him fall.  Patient states he fell on a gravel driveway, feels that he hit his elbow first and does have a little scratch on his elbow but states his elbow feels fine otherwise..  The history is provided by the patient.   Past Medical History:  Diagnosis Date   ALLERGIC RHINITIS 11/12/2006   Allergy    Anemia    Asthma 2009   patient denies this dx   Chronic kidney disease    COPD (chronic obstructive pulmonary disease) ([place])    DIABETES MELLITUS, TYPE II 08/07/2006   ECZEMA 11/08/2008   ERECTILE DYSFUNCTION 11/12/2006   GENERALIZED VACCINIA AS COMP MEDICAL CARE NEC 09/09/2010   GERD (gastroesophageal reflux disease)    Heart murmur    HYPERLIPIDEMIA 08/07/2006   HYPERTENSION 08/07/2006   Neoplasm of uncertain behavior of skin 02/07/2009   OSTEOARTHRITIS 08/07/2006   hands   PLANTAR FASCIITIS 11/12/2006   Sleep apnea    Patient Active Problem List   Diagnosis  Date Noted   CKD (chronic kidney disease) stage 3, GFR 30-59 ml/min ([place]) 03/14/2020   Obesity (BMI 30-39.9) 09/14/2013   GENERALIZED VACCINIA AS COMP MEDICAL CARE NEC 09/09/2010   NEOPLASM OF UNCERTAIN BEHAVIOR OF SKIN 02/07/2009   ECZEMA 11/08/2008   EXTRINSIC ASTHMA, UNSPECIFIED 05/11/2008   ERECTILE DYSFUNCTION 11/12/2006   ALLERGIC RHINITIS 11/12/2006   Type 2 diabetes mellitus with hyperglycemia ([place]) 08/07/2006   Hyperlipidemia 08/07/2006   Essential hypertension 08/07/2006   Osteoarthritis 08/07/2006   Past Surgical History:  Procedure Laterality Date   COLONOSCOPY  08/2015   [place]-polyps   KNEE ARTHROSCOPY     right   TOENAIL EXCISION  10/2019   TONSILLECTOMY AND ADENOIDECTOMY  1955   WISDOM TOOTH EXTRACTION      Home Medications    Prior to Admission medications   Medication Sig Start Date End Date Taking? Authorizing Provider  baclofen (LIORESAL) 10 MG tablet Take 1 tablet (10 mg total) by mouth 3 (three) times daily for 7 days. 04/23/22 04/30/22 Yes Lynden Oxford Scales, PA-C  ADVAIR DISKUS 250-50 MCG/ACT AEPB Inhale 1 puff into the lungs 2 (two) times daily. Patient requests brand name only- no generics. 01/19/22   Burchette, Alinda Sierras, MD  atorvastatin (LIPITOR) 20 MG tablet TAKE 1 TABLET BY MOUTH DAILY 02/18/22   Burchette,  Alinda Sierras, MD  chlorpheniramine (CHLOR-TRIMETON) 4 MG tablet Take 4 mg by mouth every 8 (eight) hours.    [provider]  empagliflozin (JARDIANCE) 25 MG TABS tablet TAKE 1 TABLET(25 MG) BY MOUTH DAILY BEFORE AND BREAKFAST 04/16/22   Burchette, Alinda Sierras, MD  famotidine (PEPCID) 20 MG tablet Take 20 mg by mouth daily.    [provider]  fluticasone (FLONASE) 50 MCG/ACT nasal spray Place into both nostrils daily.    [provider]  IRON PO Take 65 mg by mouth once.    [provider]  levocetirizine (XYZAL) 5 MG tablet Take 5 mg by mouth every evening.    [provider]  lisinopril-hydrochlorothiazide  (ZESTORETIC) 10-12.5 MG tablet TAKE 1 TABLET BY MOUTH DAILY 08/15/21   Burchette, Alinda Sierras, MD  metFORMIN (GLUCOPHAGE) 1000 MG tablet Take 1,000 mg by mouth daily with breakfast.    [provider]  montelukast (SINGULAIR) 10 MG tablet TAKE 1 TABLET(10 MG) BY MOUTH AT BEDTIME 11/13/21   Burchette, Alinda Sierras, MD  Multiple Vitamin (MULTIVITAMIN PO) Take by mouth.    [provider]  pioglitazone (ACTOS) 15 MG tablet TAKE 1 TABLET BY MOUTH DAILY 10/28/21   Burchette, Alinda Sierras, MD  Semaglutide, 1 MG/DOSE, (OZEMPIC, 1 MG/DOSE,) 2 MG/1.5ML SOPN Inject 1 mg into the skin once a week. 08/14/21   Burchette, Alinda Sierras, MD    Family History Family History  Problem Relation Age of Onset   Diabetes Mother    Alzheimer's disease Father        died age 38   Diabetes Sister        26 sisters with diabetes   Diabetes Brother    Diabetes Daughter    Colon cancer Neg Hx    Esophageal cancer Neg Hx    Stomach cancer Neg Hx    Rectal cancer Neg Hx    Social History Social History   Tobacco Use   Smoking status: Former    Packs/day: 2.00    Years: 20.00    Total pack years: 40.00    Types: Cigarettes    Quit date: 03/10/1987    Years since quitting: 35.1   Smokeless tobacco: Never  Vaping Use   Vaping Use: Never used  Substance Use Topics   Alcohol use: Yes    Alcohol/week: 2.0 standard drinks of alcohol    Types: 2 Standard drinks or equivalent per week    Comment: weekly   Drug use: No   Allergies   Tetracycline  Review of Systems Review of Systems Pertinent findings revealed after performing a 14 point review of systems has been noted in the history of present illness.  Physical Exam Triage Vital Signs ED Triage Vitals  Enc Vitals Group     BP 08/02/21 0827 (!) 147/82     Pulse Rate 08/02/21 0827 72     Resp 08/02/21 0827 18     Temp 08/02/21 0827 98.3 F (36.8 C)     Temp Source 08/02/21 0827 Oral     SpO2 08/02/21 0827 98 %     Weight --      Height --      Head  Circumference --      Peak Flow --      Pain Score 08/02/21 0826 5     Pain Loc --      Pain Edu? --      Excl. in [place]? --    Updated Vital Signs BP 113/71 (  BP Location: Left Arm)   Pulse 97   Temp 98.2 F (36.8 C) (Oral)   Resp 16   SpO2 97%   Physical Exam Vitals and nursing note reviewed.  Constitutional:      General: He is not in acute distress.    Appearance: Normal appearance. He is normal weight. He is not ill-appearing.  HENT:     Head: Normocephalic and atraumatic.  Eyes:     Extraocular Movements: Extraocular movements intact.     Conjunctiva/sclera: Conjunctivae normal.     Pupils: Pupils are equal, round, and reactive to light.  Cardiovascular:     Rate and Rhythm: Normal rate and regular rhythm.  Pulmonary:     Effort: Pulmonary effort is normal.     Breath sounds: Normal breath sounds.  Musculoskeletal:        General: Tenderness (Midthoracic right-sided paraspinous muscles tender to palpation with mild spasm) present. Normal range of motion.     Cervical back: Normal range of motion and neck supple.  Skin:    General: Skin is warm and dry.  Neurological:     General: No focal deficit present.     Mental Status: He is alert and oriented to person, place, and time. Mental status is at baseline.     Cranial Nerves: No cranial nerve deficit.     Sensory: No sensory deficit.     Motor: No weakness.     Coordination: Coordination normal.     Gait: Gait normal.     Deep Tendon Reflexes: Reflexes normal.  Psychiatric:        Mood and Affect: Mood normal.        Behavior: Behavior normal.        Thought Content: Thought content normal.        Judgment: Judgment normal.     UC Couse / Diagnostics / Procedures:     Radiology No results found.  Procedures Procedures (including critical care time) EKG  Pending results:  Labs Reviewed - No data to display  Medications Ordered in UC: Medications - No data to display  UC Diagnoses / Final Clinical  Impressions(s)   I have reviewed the triage vital signs and the nursing notes.  Pertinent labs & imaging results that were available during my care of the patient were reviewed by me and considered in my medical decision making (see chart for details).    Final diagnoses:  Mid back pain on right side   Patient reports a history of mild to moderate kidney failure, states over the past year and a half he has been advised that his kidney function has improved but does not recall his last GFR, patient states he has regular follow-up with a nephrologist.  Patient was advised that I believe it would be reasonable for him to use some of the Voltaren gel that he has at home a few times a day for the next few days which may be all that is required to relieve his current symptoms of pain in his mid back.  Patient was provided with prescription for baclofen and advised to rest, not attempt to stretch or strengthen the area until feeling completely better.  Return precautions advised. ED Prescriptions     Medication Sig Dispense Auth. Provider   baclofen (LIORESAL) 10 MG tablet Take 1 tablet (10 mg total) by mouth 3 (three) times daily for 7 days. 21 tablet Lynden Oxford Scales, PA-C      PDMP not reviewed this encounter.  Discharge Instructions:   Discharge Instructions      I sent a prescription for baclofen to your pharmacy.  You are welcome to take 1 tablet 3 times daily but if you find that it makes you very sleepy, you can certainly break your daytime tablets in half and take a full tablet at bedtime.  I would take these for the next several days until your pain is mostly resolved.  According to my pharmaceutical reference, Micromedex, the Voltaren 1% gel systemically absorbed over the period of 10 to 14 hours and the bioavailability is only 6% of the amount absorbed when you take Voltaren by mouth.  It is safe to use and mild to moderate kidney disease, your most recent kidney function testing  that I can see in our chart was from February 2022 which demonstrated that you were in stage IIIa chronic kidney failure.    Because you are seeing a kidney specialist at this time and have been told that you are kidney function has improved since she discontinued use of all NSAIDs, I personally believe that it would be safe for you to use Voltaren gel 3 times daily on your back for the next few days without causing any irreparable harm to your kidneys.  During the day, please set aside time to apply ice to the affected area 4 times daily for 20 minutes each application.  This can be achieved by using a bag of frozen peas or corn, a Ziploc bag filled with ice and water, or Ziploc bag filled with half rubbing alcohol and half Dawn dish detergent, frozen into a slush.  Please be careful not to apply ice directly to your skin, always place a soft cloth between you and the ice pack.   Please avoid attempts to stretch or strengthen the affected area until you are feeling completely pain-free.  Attempts to do so will only prolong the healing process.   Thank you for visiting urgent care today.  We appreciate the opportunity to participate in your care.     Disposition Upon Discharge:  Condition: stable for discharge home Home: take medications as prescribed; routine discharge instructions as discussed; follow up as advised.  Patient presented with an acute illness with associated systemic symptoms and significant discomfort requiring urgent management. In my opinion, this is a condition that a prudent lay person (someone who possesses an average knowledge of health and medicine) may potentially expect to result in complications if not addressed urgently such as respiratory distress, impairment of bodily function or dysfunction of bodily organs.   Routine symptom specific, illness specific and/or disease specific instructions were discussed with the patient and/or caregiver at length.   As such, the  patient has been evaluated and assessed, work-up was performed and treatment was provided in alignment with urgent care protocols and evidence based medicine.  Patient/parent/caregiver has been advised that the patient may require follow up for further testing and treatment if the symptoms continue in spite of treatment, as clinically indicated and appropriate.  Patient/parent/caregiver has been advised to report to orthopedic urgent care clinic or return to the [facility] or PCP in 3-5 days if no better; follow-up with orthopedics, PCP or the Emergency Department if new signs and symptoms develop or if the current signs or symptoms continue to change or worsen for further workup, evaluation and treatment as clinically indicated and appropriate  The patient will follow up with their current PCP if and as advised. If the patient does not currently have  a PCP we will have assisted them in obtaining one.   The patient may need specialty follow up if the symptoms continue, in spite of conservative treatment and management, for further workup, evaluation, consultation and treatment as clinically indicated and appropriate.  Patient/parent/caregiver verbalized understanding and agreement of plan as discussed.  All questions were addressed during visit.  Please see discharge instructions below for further details of plan.  This office note has been dictated using Museum/gallery curator.  Unfortunately, this method of dictation can sometimes lead to typographical or grammatical errors.  I apologize for your inconvenience in advance if this occurs.  Please do not hesitate to reach out to me if clarification is needed.      Lynden Oxford Scales, PA-C 04/23/22 1129

## 2022-04-23 NOTE — ED Triage Notes (Addendum)
The patient had a fall on Monday morning he is now having back spasms. The patient states the pain is worse on the right side.   Home interventions: Ice/heat packs & Biofreeze.

## 2022-04-23 NOTE — Discharge Instructions (Addendum)
I sent a prescription for baclofen to your pharmacy.  You are welcome to take 1 tablet 3 times daily but if you find that it makes you very sleepy, you can certainly break your daytime tablets in half and take a full tablet at bedtime.  I would take these for the next several days until your pain is mostly resolved.  According to my pharmaceutical reference, Micromedex, the Voltaren 1% gel systemically absorbed over the period of 10 to 14 hours and the bioavailability is only 6% of the amount absorbed when you take Voltaren by mouth.  It is safe to use and mild to moderate kidney disease, your most recent kidney function testing that I can see in our chart was from February 2022 which demonstrated that you were in stage IIIa chronic kidney failure.    Because you are seeing a kidney specialist at this time and have been told that you are kidney function has improved since she discontinued use of all NSAIDs, I personally believe that it would be safe for you to use Voltaren gel 3 times daily on your back for the next few days without causing any irreparable harm to your kidneys.  During the day, please set aside time to apply ice to the affected area 4 times daily for 20 minutes each application.  This can be achieved by using a bag of frozen peas or corn, a Ziploc bag filled with ice and water, or Ziploc bag filled with half rubbing alcohol and half Dawn dish detergent, frozen into a slush.  Please be careful not to apply ice directly to your skin, always place a soft cloth between you and the ice pack.   Please avoid attempts to stretch or strengthen the affected area until you are feeling completely pain-free.  Attempts to do so will only prolong the healing process.   Thank you for visiting urgent care today.  We appreciate the opportunity to participate in your care.

## 2022-04-30 MED ORDER — PIOGLITAZONE HCL 15 MG PO TABS: 15.0000 mg | ORAL_TABLET | Freq: Every day | ORAL | 1 refills | Status: DC

## 2022-04-30 NOTE — Addendum Note (Signed)
Addended by: Nilda Riggs on: 04/30/2022 08:15 AM   Modules accepted: Orders

## 2022-05-07 ENCOUNTER — Other Ambulatory Visit: Payer: Self-pay | Admitting: Family Medicine

## 2022-05-21 DIAGNOSIS — M79671 Pain in right foot: Secondary | ICD-10-CM | POA: Diagnosis not present

## 2022-05-21 DIAGNOSIS — M2021 Hallux rigidus, right foot: Secondary | ICD-10-CM | POA: Diagnosis not present

## 2022-06-11 DIAGNOSIS — N183 Chronic kidney disease, stage 3 unspecified: Secondary | ICD-10-CM | POA: Diagnosis not present

## 2022-06-17 ENCOUNTER — Encounter: Payer: Self-pay | Admitting: Family Medicine

## 2022-06-24 ENCOUNTER — Encounter: Payer: Self-pay | Admitting: Family Medicine

## 2022-06-24 ENCOUNTER — Ambulatory Visit (INDEPENDENT_AMBULATORY_CARE_PROVIDER_SITE_OTHER): Payer: Medicare Other | Admitting: Family Medicine

## 2022-06-24 VITALS — BP 124/60 | HR 95 | Temp 98.0°F | Ht 69.0 in | Wt 211.8 lb

## 2022-06-24 DIAGNOSIS — N1831 Chronic kidney disease, stage 3a: Secondary | ICD-10-CM

## 2022-06-24 DIAGNOSIS — E785 Hyperlipidemia, unspecified: Secondary | ICD-10-CM | POA: Diagnosis not present

## 2022-06-24 DIAGNOSIS — E1165 Type 2 diabetes mellitus with hyperglycemia: Secondary | ICD-10-CM | POA: Diagnosis not present

## 2022-06-24 DIAGNOSIS — I10 Essential (primary) hypertension: Secondary | ICD-10-CM

## 2022-06-24 LAB — POCT GLYCOSYLATED HEMOGLOBIN (HGB A1C): Hemoglobin A1C: 6.9 % — AB (ref 4.0–5.6)

## 2022-06-24 NOTE — Progress Notes (Signed)
Established Patient Office Visit  Subjective   Patient ID: Richard Dalton, male    DOB: March 09, 1951  Age: 71 y.o. MRN: [id number]  No chief complaint on file.   HPI   Richard Dalton is seen for medical follow-up.  He brings in some labs that were done recently through nephrologist.  Recent creatinine 1.55.  He sees nephrologist yearly.  He had some recent issues with right foot pain with severe osteoarthritis first MTP joint.  He has seen orthopedist and looking at probable fusion of that joint soon.  He has had some recent urine urgency but no slow stream.  Does not feel this is severe enough to warrant medications at this time.  Diabetes has been well controlled.  He is on combination therapy with metformin, Jardiance, Actos, Ozempic.  Most recent GFR 47.  His blood pressure has been very well controlled on lisinopril HCTZ.  His weight is stable.  He remains on atorvastatin 20 mg daily for hyperlipidemia.  No significant myalgias.  He has had multiple recent immunizations including flu vaccine, RSV, and COVID booster.  Past Medical History:  Diagnosis Date   ALLERGIC RHINITIS 11/12/2006   Allergy    Anemia    Asthma 2009   patient denies this dx   Chronic kidney disease    COPD (chronic obstructive pulmonary disease) ([place])    DIABETES MELLITUS, TYPE II 08/07/2006   ECZEMA 11/08/2008   ERECTILE DYSFUNCTION 11/12/2006   GENERALIZED VACCINIA AS COMP MEDICAL CARE NEC 09/09/2010   GERD (gastroesophageal reflux disease)    Heart murmur    HYPERLIPIDEMIA 08/07/2006   HYPERTENSION 08/07/2006   Neoplasm of uncertain behavior of skin 02/07/2009   OSTEOARTHRITIS 08/07/2006   hands   PLANTAR FASCIITIS 11/12/2006   Sleep apnea    Past Surgical History:  Procedure Laterality Date   COLONOSCOPY  08/2015   Perry-polyps   KNEE ARTHROSCOPY     right   TOENAIL EXCISION  10/2019   TONSILLECTOMY AND ADENOIDECTOMY  1955   WISDOM TOOTH EXTRACTION      reports that he quit smoking about 35 years  ago. His smoking use included cigarettes. He has a 40.00 pack-year smoking history. He has never used smokeless tobacco. He reports current alcohol use of about 2.0 standard drinks of alcohol per week. He reports that he does not use drugs. family history includes Alzheimer's disease in his father; Diabetes in his brother, daughter, mother, and sister. Allergies  Allergen Reactions   Tetracycline     REACTION: rash    Review of Systems  Constitutional:  Negative for chills, fever and malaise/fatigue.  Eyes:  Negative for blurred vision.  Respiratory:  Negative for shortness of breath.   Cardiovascular:  Negative for chest pain.  Genitourinary:  Positive for urgency.  Neurological:  Negative for dizziness, weakness and headaches.      Objective:     BP 124/60 (BP Location: Left Arm, Patient Position: Sitting, Cuff Size: Normal)   Pulse 95   Temp 98 F (36.7 C) (Oral)   Ht '5\' 9"'$  (1.753 m)   Wt 211 lb 12.8 oz (96.1 kg)   SpO2 99%   BMI 31.28 kg/m    Physical Exam Vitals reviewed.  Constitutional:      Appearance: He is well-developed.  HENT:     Right Ear: External ear normal.     Left Ear: External ear normal.  Eyes:     Pupils: Pupils are equal, round, and reactive to light.  Neck:  Thyroid: No thyromegaly.  Cardiovascular:     Rate and Rhythm: Normal rate and regular rhythm.  Pulmonary:     Effort: Pulmonary effort is normal. No respiratory distress.     Breath sounds: Normal breath sounds. No wheezing or rales.  Musculoskeletal:     Cervical back: Neck supple.  Neurological:     Mental Status: He is alert and oriented to person, place, and time.      Results for orders placed or performed in visit on 06/24/22  POCT glycosylated hemoglobin (Hb A1C)  Result Value Ref Range   Hemoglobin A1C 6.9 (A) 4.0 - 5.6 %   HbA1c POC (<> result, manual entry)     HbA1c, POC (prediabetic range)     HbA1c, POC (controlled diabetic range)        The ASCVD Risk  score (Arnett DK, et al., 2019) failed to calculate for the following reasons:   The valid total cholesterol range is 130 to 320 mg/dL    Assessment & Plan:   #1 type 2 diabetes stable and slightly improved with A1c today 6.9%.  Continue current regimen above.  Reassess in 4 months.  Check diabetic foot exam then.  Continue yearly eye exam.  Check urine microalbumin at follow-up We will need to continue to monitor renal function and if GFR dropping below 30 discontinue metformin  #2 hypertension stable and well-controlled.  Continue lisinopril HCTZ.  #3 chronic kidney disease followed by nephrology  #4 hyperlipidemia treated with atorvastatin 20 mg daily.  Recheck fasting lipid at follow-up in January   Return in about 4 months (around 10/24/2022).    Carolann Littler, MD

## 2022-06-25 DIAGNOSIS — N1831 Chronic kidney disease, stage 3a: Secondary | ICD-10-CM | POA: Diagnosis not present

## 2022-06-25 DIAGNOSIS — I129 Hypertensive chronic kidney disease with stage 1 through stage 4 chronic kidney disease, or unspecified chronic kidney disease: Secondary | ICD-10-CM | POA: Diagnosis not present

## 2022-06-25 DIAGNOSIS — D631 Anemia in chronic kidney disease: Secondary | ICD-10-CM | POA: Diagnosis not present

## 2022-06-25 DIAGNOSIS — N2581 Secondary hyperparathyroidism of renal origin: Secondary | ICD-10-CM | POA: Diagnosis not present

## 2022-07-21 DIAGNOSIS — H5213 Myopia, bilateral: Secondary | ICD-10-CM | POA: Diagnosis not present

## 2022-07-21 LAB — HM DIABETES EYE EXAM

## 2022-07-29 ENCOUNTER — Other Ambulatory Visit: Payer: Self-pay | Admitting: Family Medicine

## 2022-07-30 ENCOUNTER — Encounter: Payer: Self-pay | Admitting: Family Medicine

## 2022-07-30 MED ORDER — OZEMPIC (1 MG/DOSE) 2 MG/1.5ML ~~LOC~~ SOPN: 1.0000 mg | PEN_INJECTOR | SUBCUTANEOUS | 3 refills | Status: DC

## 2022-08-08 ENCOUNTER — Other Ambulatory Visit: Payer: Self-pay | Admitting: Family Medicine

## 2022-08-08 DIAGNOSIS — I10 Essential (primary) hypertension: Secondary | ICD-10-CM

## 2022-08-14 DIAGNOSIS — H5213 Myopia, bilateral: Secondary | ICD-10-CM | POA: Diagnosis not present

## 2022-08-25 ENCOUNTER — Encounter: Payer: Self-pay | Admitting: *Deleted

## 2022-08-25 ENCOUNTER — Telehealth: Payer: Self-pay | Admitting: *Deleted

## 2022-08-25 NOTE — Patient Instructions (Signed)
Visit Information  Thank you for taking time to visit with me today. Please don't hesitate to contact me if I can be of assistance to you.   Following are the goals we discussed today:   Goals Addressed               This Visit's Progress     COMPLETED: No needs (pt-stated)        Care Coordination Interventions: Reviewed medications with patient and discussed Adherence with no needed refills Reviewed scheduled/upcoming provider appointments including sufficient transportation Assessed social determinant of health barriers         Please call the care guide team at [phone] if you need to cancel or reschedule your appointment.   If you are experiencing a Mental Health or [place] or need someone to talk to, please call the Suicide and Crisis Lifeline: 988  Patient verbalizes understanding of instructions and care plan provided today and agrees to view in [place]. Active MyChart status and patient understanding of how to access instructions and care plan via MyChart confirmed with patient.     No further follow up required: No needs    Raina Mina, RN Care Management Coordinator [place] Office [phone]

## 2022-08-25 NOTE — Patient Outreach (Signed)
  Care Coordination   Initial Visit Note   08/25/2022 Name: Richard Dalton MRN: [id number] DOB: 14-Aug-1951  Richard Dalton is a 71 y.o. year old male who sees Burchette, Alinda Sierras, MD for primary care. I spoke with  Richard Dalton by phone today.  What matters to the patients health and wellness today?  No needs    Goals Addressed               This Visit's Progress     COMPLETED: No needs (pt-stated)        Care Coordination Interventions: Reviewed medications with patient and discussed Adherence with no needed refills Reviewed scheduled/upcoming provider appointments including sufficient transportation Assessed social determinant of health barriers         SDOH assessments and interventions completed:  Yes  SDOH Interventions Today    Flowsheet Row Most Recent Value  SDOH Interventions   Food Insecurity Interventions Intervention Not Indicated  Housing Interventions Intervention Not Indicated  Transportation Interventions Intervention Not Indicated  Utilities Interventions Intervention Not Indicated        Care Coordination Interventions Activated:  Yes  Care Coordination Interventions:  Yes, provided   Follow up plan: No further intervention required.   Encounter Outcome:  Pt. Visit Completed    Raina Mina, RN Care Management Coordinator [place] Office [phone]

## 2022-09-22 HISTORY — PX: TOE SURGERY: SHX1073

## 2022-09-23 DIAGNOSIS — M2021 Hallux rigidus, right foot: Secondary | ICD-10-CM | POA: Diagnosis not present

## 2022-10-17 ENCOUNTER — Other Ambulatory Visit: Payer: Self-pay

## 2022-10-17 MED ORDER — ATORVASTATIN CALCIUM 20 MG PO TABS: 20.0000 mg | ORAL_TABLET | Freq: Every day | ORAL | 2 refills | Status: DC

## 2022-10-24 ENCOUNTER — Ambulatory Visit: Payer: Medicare Other | Admitting: Family Medicine

## 2022-10-29 ENCOUNTER — Encounter: Payer: Self-pay | Admitting: Family Medicine

## 2022-10-29 ENCOUNTER — Ambulatory Visit (INDEPENDENT_AMBULATORY_CARE_PROVIDER_SITE_OTHER): Payer: Medicare Other | Admitting: Family Medicine

## 2022-10-29 VITALS — BP 124/60 | HR 97 | Temp 97.9°F | Ht 69.0 in | Wt 212.2 lb

## 2022-10-29 DIAGNOSIS — E1165 Type 2 diabetes mellitus with hyperglycemia: Secondary | ICD-10-CM | POA: Diagnosis not present

## 2022-10-29 DIAGNOSIS — E785 Hyperlipidemia, unspecified: Secondary | ICD-10-CM | POA: Diagnosis not present

## 2022-10-29 DIAGNOSIS — I10 Essential (primary) hypertension: Secondary | ICD-10-CM | POA: Diagnosis not present

## 2022-10-29 LAB — POCT GLYCOSYLATED HEMOGLOBIN (HGB A1C): Hemoglobin A1C: 7.5 % — AB (ref 4.0–5.6)

## 2022-10-29 MED ORDER — METFORMIN HCL 1000 MG PO TABS: 1000.0000 mg | ORAL_TABLET | Freq: Every day | ORAL | 3 refills | Status: DC

## 2022-10-29 MED ORDER — PIOGLITAZONE HCL 15 MG PO TABS: 15.0000 mg | ORAL_TABLET | Freq: Every day | ORAL | 3 refills | Status: DC

## 2022-10-29 MED ORDER — LISINOPRIL-HYDROCHLOROTHIAZIDE 10-12.5 MG PO TABS: 1.0000 | ORAL_TABLET | Freq: Every day | ORAL | 3 refills | Status: DC

## 2022-10-29 NOTE — Progress Notes (Signed)
Established Patient Office Visit  Subjective   Patient ID: Richard Dalton, male    DOB: 02/08/1951  Age: 72 y.o. MRN: [id number]  Chief Complaint  Patient presents with   Follow-up    HPI   Richard Dalton seen for medical follow-up.  Recent right foot surgery and is recovering from that.  That has limited his activity over the past few months.  He also has had perhaps worse compliance with diet during that time.  He is A1c is up to 7.5% today likely reflecting the above.  His diabetes medications remain unchanged.  He needs refills of several medications today including Actos, lisinopril, and metformin.  He has chronic kidney disease and is followed by nephrology.  He had recent labs and renal function stable.  He is due for follow-up lipids at this time.  He has asthma which is controlled with Advair.  His insurance will require generic this year.  Denies any recent chest pains.  Slowly starting to progress his walking.  Hopes to pick this up over the next few months.  Past Medical History:  Diagnosis Date   ALLERGIC RHINITIS 11/12/2006   Allergy    Anemia    Asthma 2009   patient denies this dx   Chronic kidney disease    COPD (chronic obstructive pulmonary disease) ([place])    DIABETES MELLITUS, TYPE II 08/07/2006   ECZEMA 11/08/2008   ERECTILE DYSFUNCTION 11/12/2006   GENERALIZED VACCINIA AS COMP MEDICAL CARE NEC 09/09/2010   GERD (gastroesophageal reflux disease)    Heart murmur    HYPERLIPIDEMIA 08/07/2006   HYPERTENSION 08/07/2006   Neoplasm of uncertain behavior of skin 02/07/2009   OSTEOARTHRITIS 08/07/2006   hands   PLANTAR FASCIITIS 11/12/2006   Sleep apnea    Past Surgical History:  Procedure Laterality Date   COLONOSCOPY  08/2015   Richard Dalton   KNEE ARTHROSCOPY     right   TOENAIL EXCISION  10/2019   TONSILLECTOMY AND ADENOIDECTOMY  1955   WISDOM TOOTH EXTRACTION      reports that he quit smoking about 35 years ago. His smoking use included cigarettes. He has  a 40.00 pack-year smoking history. He has never used smokeless tobacco. He reports current alcohol use of about 2.0 standard drinks of alcohol per week. He reports that he does not use drugs. family history includes Alzheimer's disease in his father; Diabetes in his brother, daughter, mother, and sister. Allergies  Allergen Reactions   Tetracycline     REACTION: rash    Review of Systems  Constitutional:  Negative for chills, fever and malaise/fatigue.  Eyes:  Negative for blurred vision.  Respiratory:  Negative for shortness of breath.   Cardiovascular:  Negative for chest pain.  Gastrointestinal:  Negative for abdominal pain.  Neurological:  Negative for dizziness, weakness and headaches.      Objective:     BP 124/60 (BP Location: Left Arm, Patient Position: Sitting, Cuff Size: Normal)   Pulse 97   Temp 97.9 F (36.6 C) (Oral)   Ht '5\' 9"'$  (1.753 m)   Wt 212 lb 3.2 oz (96.3 kg)   SpO2 98%   BMI 31.34 kg/m    Physical Exam Constitutional:      Appearance: He is well-developed.  HENT:     Right Ear: External ear normal.     Left Ear: External ear normal.  Eyes:     Pupils: Pupils are equal, round, and reactive to light.  Neck:     Thyroid: No thyromegaly.  Cardiovascular:     Rate and Rhythm: Normal rate and regular rhythm.  Pulmonary:     Effort: Pulmonary effort is normal. No respiratory distress.     Breath sounds: Normal breath sounds. No wheezing or rales.  Musculoskeletal:     Cervical back: Neck supple.     Comments: Right foot reveals healing wound over the first metatarsal joint.  He has small amount of eschar.  No cellulitis changes.  Minimal edema.  Skin:    Comments: Normal monofilament testing throughout.  Healing wound right foot otherwise no lesions.  No significant callus.  Neurological:     Mental Status: He is alert and oriented to person, place, and time.      Results for orders placed or performed in visit on 10/29/22  POCT glycosylated  hemoglobin (Hb A1C)  Result Value Ref Range   Hemoglobin A1C 7.5 (A) 4.0 - 5.6 %   HbA1c POC (<> result, manual entry)     HbA1c, POC (prediabetic range)     HbA1c, POC (controlled diabetic range)        The ASCVD Risk score (Arnett DK, et al., 2019) failed to calculate for the following reasons:   The valid total cholesterol range is 130 to 320 mg/dL    Assessment & Plan:   Problem List Items Addressed This Visit       Unprioritized   Type 2 diabetes mellitus with hyperglycemia ([place]) - Primary   Relevant Medications   pioglitazone (ACTOS) 15 MG tablet   lisinopril-hydrochlorothiazide (ZESTORETIC) 10-12.5 MG tablet   metFORMIN (GLUCOPHAGE) 1000 MG tablet   Other Relevant Orders   POCT glycosylated hemoglobin (Hb A1C) (Completed)   Hyperlipidemia   Relevant Medications   lisinopril-hydrochlorothiazide (ZESTORETIC) 10-12.5 MG tablet   Other Relevant Orders   Lipid panel   Hepatic function panel   Essential hypertension   Relevant Medications   lisinopril-hydrochlorothiazide (ZESTORETIC) 10-12.5 MG tablet  -Obtain follow-up labs with lipid and hepatic panel -His A1c today is up to 7.5%.  We discussed options and he would like to tighten up diet and work on lifestyle changes over the next 3 months and reassess before considering further titration of Ozempic.  We did refill his Actos and metformin for 1 year -Refill lisinopril HCTZ for 1 year  Return in about 3 months (around 01/28/2023).    Carolann Littler, MD

## 2022-10-31 ENCOUNTER — Other Ambulatory Visit (INDEPENDENT_AMBULATORY_CARE_PROVIDER_SITE_OTHER): Payer: Medicare Other

## 2022-10-31 DIAGNOSIS — E785 Hyperlipidemia, unspecified: Secondary | ICD-10-CM | POA: Diagnosis not present

## 2022-10-31 LAB — LIPID PANEL
Cholesterol: 148 mg/dL (ref 0–200)
HDL: 54.4 mg/dL (ref >39.00)
LDL Cholesterol: 73 mg/dL (ref 0–99)
NonHDL: 93.38
Total CHOL/HDL Ratio: 3
Triglycerides: 104 mg/dL (ref 0.0–149.0)
VLDL: 20.8 mg/dL (ref 0.0–40.0)

## 2022-10-31 LAB — HEPATIC FUNCTION PANEL
ALT: 19 U/L (ref 0–53)
AST: 19 U/L (ref 0–37)
Albumin: 4 g/dL (ref 3.5–5.2)
Alkaline Phosphatase: 88 U/L (ref 39–117)
Bilirubin, Direct: 0.1 mg/dL (ref 0.0–0.3)
Total Bilirubin: 0.6 mg/dL (ref 0.2–1.2)
Total Protein: 6.9 g/dL (ref 6.0–8.3)

## 2022-11-01 ENCOUNTER — Encounter: Payer: Self-pay | Admitting: Family Medicine

## 2022-11-04 ENCOUNTER — Other Ambulatory Visit: Payer: Self-pay | Admitting: Family Medicine

## 2022-11-06 ENCOUNTER — Other Ambulatory Visit (HOSPITAL_COMMUNITY): Payer: Self-pay

## 2022-11-06 MED ORDER — BUDESONIDE-FORMOTEROL FUMARATE 160-4.5 MCG/ACT IN AERO: 2.0000 | INHALATION_SPRAY | Freq: Two times a day (BID) | RESPIRATORY_TRACT | 3 refills | Status: DC

## 2022-11-06 NOTE — Telephone Encounter (Signed)
Pharmacy Patient Advocate Encounter  Prior Authorization for Ozempic has been approved.    PA# [id number] Effective dates: 11/06/2022 through 11/07/2023

## 2022-11-06 NOTE — Telephone Encounter (Signed)
Patient Advocate Encounter   Received notification from [facility] that prior authorization for Ozempic is required.   PA submitted on 11/06/2022 Key [id number] Status is pending

## 2022-11-07 DIAGNOSIS — M2021 Hallux rigidus, right foot: Secondary | ICD-10-CM | POA: Diagnosis not present

## 2022-11-17 NOTE — Telephone Encounter (Signed)
Noted  Xitlaly Ault W Bellarose Burtt MD  Primary [facility] at [facility]  

## 2022-11-26 DIAGNOSIS — K080 Exfoliation of teeth due to systemic causes: Secondary | ICD-10-CM | POA: Diagnosis not present

## 2022-12-05 DIAGNOSIS — M2021 Hallux rigidus, right foot: Secondary | ICD-10-CM | POA: Diagnosis not present

## 2022-12-15 DIAGNOSIS — N1831 Chronic kidney disease, stage 3a: Secondary | ICD-10-CM | POA: Diagnosis not present

## 2022-12-15 LAB — COMPREHENSIVE METABOLIC PANEL
Albumin: 4.2 (ref 3.5–5.0)
Calcium: 9.6 (ref 8.7–10.7)
eGFR: 52

## 2022-12-15 LAB — BASIC METABOLIC PANEL
BUN: 21 (ref 4–21)
CO2: 24 — AB (ref 13–22)
Chloride: 99 (ref 99–108)
Creatinine: 1.4 — AB (ref 0.6–1.3)
Glucose: 233
Potassium: 4.2 meq/L (ref 3.5–5.1)
Sodium: 141 (ref 137–147)

## 2022-12-15 LAB — CBC AND DIFFERENTIAL: Hemoglobin: 16 (ref 13.5–17.5)

## 2022-12-15 LAB — MICROALBUMIN / CREATININE URINE RATIO: Microalb Creat Ratio: 28

## 2022-12-15 LAB — PROTEIN / CREATININE RATIO, URINE
Albumin, U: 11.1
Creatinine, Urine: 40

## 2022-12-24 DIAGNOSIS — N1831 Chronic kidney disease, stage 3a: Secondary | ICD-10-CM | POA: Diagnosis not present

## 2022-12-24 DIAGNOSIS — N2581 Secondary hyperparathyroidism of renal origin: Secondary | ICD-10-CM | POA: Diagnosis not present

## 2022-12-24 DIAGNOSIS — D631 Anemia in chronic kidney disease: Secondary | ICD-10-CM | POA: Diagnosis not present

## 2022-12-24 DIAGNOSIS — I129 Hypertensive chronic kidney disease with stage 1 through stage 4 chronic kidney disease, or unspecified chronic kidney disease: Secondary | ICD-10-CM | POA: Diagnosis not present

## 2023-01-05 DIAGNOSIS — M2021 Hallux rigidus, right foot: Secondary | ICD-10-CM | POA: Diagnosis not present

## 2023-01-14 DIAGNOSIS — K080 Exfoliation of teeth due to systemic causes: Secondary | ICD-10-CM | POA: Diagnosis not present

## 2023-01-21 DIAGNOSIS — H524 Presbyopia: Secondary | ICD-10-CM | POA: Diagnosis not present

## 2023-01-27 ENCOUNTER — Ambulatory Visit (INDEPENDENT_AMBULATORY_CARE_PROVIDER_SITE_OTHER): Payer: Medicare Other | Admitting: Family

## 2023-01-27 ENCOUNTER — Encounter: Payer: Self-pay | Admitting: Family

## 2023-01-27 VITALS — BP 120/60 | HR 100 | Temp 98.3°F | Wt 211.8 lb

## 2023-01-27 DIAGNOSIS — E781 Pure hyperglyceridemia: Secondary | ICD-10-CM

## 2023-01-27 DIAGNOSIS — E1165 Type 2 diabetes mellitus with hyperglycemia: Secondary | ICD-10-CM

## 2023-01-27 DIAGNOSIS — I10 Essential (primary) hypertension: Secondary | ICD-10-CM | POA: Diagnosis not present

## 2023-01-27 LAB — POCT GLYCOSYLATED HEMOGLOBIN (HGB A1C): Hemoglobin A1C: 7 % — AB (ref 4.0–5.6)

## 2023-01-27 NOTE — Progress Notes (Signed)
Established Patient Office Visit  Subjective   Patient ID: Richard Dalton, male    DOB: Jun 22, 1951  Age: 72 y.o. MRN: [id number]  No chief complaint on file.   HPI 72 year old male patient of Dr. Caryl Never is in today for recheck of his chronic healthcare conditions that include type 2 diabetes, hyperlipidemia, hypertension.  He reports he is doing well.  Diet has been stable.  Exercises increased postsurgery.  He had surgery on his right great toe with a fusion in December that left him in a boot for 6 weeks.  He is back to walking his dog every day.  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  Denies any concerns today.  Declines labs today reports he had them last month and we will either locate a copy or he will send a copy to [place].  Review of Systems  All other systems reviewed and are negative. Past Medical History:  Diagnosis Date  . ALLERGIC RHINITIS 11/12/2006  . Allergy   . Anemia   . Asthma 2009   patient denies this dx  . Chronic kidney disease   . COPD (chronic obstructive pulmonary disease)   . DIABETES MELLITUS, TYPE II 08/07/2006  . ECZEMA 11/08/2008  . ERECTILE DYSFUNCTION 11/12/2006  . GENERALIZED VACCINIA AS COMP MEDICAL CARE NEC 09/09/2010  . GERD (gastroesophageal reflux disease)   . Heart murmur   . HYPERLIPIDEMIA 08/07/2006  . HYPERTENSION 08/07/2006  . Neoplasm of uncertain behavior of skin 02/07/2009  . OSTEOARTHRITIS 08/07/2006   hands  . PLANTAR FASCIITIS 11/12/2006  . Sleep apnea     Social History   Socioeconomic History  . Marital status: Married    Spouse name: Not on file  . Number of children: Not on file  . Years of education: Not on file  . Highest education level: Bachelor's degree (e.g., BA, AB, BS)  Occupational History    Comment: retired  Tobacco Use  . Smoking status: Former    Packs/day: 2.00    Years: 20.00    Additional pack years: 0.00    Total pack years: 40.00    Types: Cigarettes    Quit date: 03/10/1987    Years  since quitting: 35.9  . Smokeless tobacco: Never  Vaping Use  . Vaping Use: Never used  Substance and Sexual Activity  . Alcohol use: Yes    Alcohol/week: 2.0 standard drinks of alcohol    Types: 2 Standard drinks or equivalent per week    Comment: weekly  . Drug use: No  . Sexual activity: Yes  Other Topics Concern  . Not on file  Social History Narrative  . Not on file   Social Determinants of Health   Financial Resource Strain: Low Risk  (01/21/2023)   Overall Financial Resource Strain (CARDIA)   . Difficulty of Paying Living Expenses: Not hard at all  Food Insecurity: No Food Insecurity (01/21/2023)   Hunger Vital Sign   . Worried About Programme researcher, broadcasting/film/video in the Last Year: Never true   . Ran Out of Food in the Last Year: Never true  Transportation Needs: No Transportation Needs (01/21/2023)   PRAPARE - Transportation   . Lack of Transportation (Medical): No   . Lack of Transportation (Non-Medical): No  Physical Activity: Sufficiently Active (01/21/2023)   Exercise Vital Sign   . Days of Exercise per Week: 5 days   . Minutes of Exercise per Session: 30 min  Stress: No Stress Concern Present (01/21/2023)  [employer] of Occupational Health - Occupational Stress Questionnaire   . Feeling of Stress : Not at all  Social Connections: Moderately Integrated (01/21/2023)   Social Connection and Isolation Panel [NHANES]   . Frequency of Communication with Friends and Family: Three times a week   . Frequency of Social Gatherings with Friends and Family: More than three times a week   . Attends Religious Services: Never   . Active Member of Clubs or Organizations: Yes   . Attends Banker Meetings: More than 4 times per year   . Marital Status: Married  Catering manager Violence: Not At Risk (03/24/2022)   Humiliation, Afraid, Rape, and Kick questionnaire   . Fear of Current or Ex-Partner: No   . Emotionally Abused: No   . Physically Abused: No   . Sexually  Abused: No    Past Surgical History:  Procedure Laterality Date  . COLONOSCOPY  08/2015   Perry-polyps  . KNEE ARTHROSCOPY     right  . TOENAIL EXCISION  10/2019  . TONSILLECTOMY AND ADENOIDECTOMY  1955  . WISDOM TOOTH EXTRACTION      Family History  Problem Relation Age of Onset  . Diabetes Mother   . Alzheimer's disease Father        died age 91  . Diabetes Sister        4 sisters with diabetes  . Diabetes Brother   . Diabetes Daughter   . Colon cancer Neg Hx   . Esophageal cancer Neg Hx   . Stomach cancer Neg Hx   . Rectal cancer Neg Hx     Allergies  Allergen Reactions  . Tetracycline     REACTION: rash    Current Outpatient Medications on File Prior to Visit  Medication Sig Dispense Refill  . atorvastatin (LIPITOR) 20 MG tablet Take 1 tablet (20 mg total) by mouth daily. 90 tablet 2  . budesonide-formoterol (SYMBICORT) 160-4.5 MCG/ACT inhaler Inhale 2 puffs into the lungs 2 (two) times daily. 1 each 3  . chlorpheniramine (CHLOR-TRIMETON) 4 MG tablet Take 4 mg by mouth every 8 (eight) hours.    . empagliflozin (JARDIANCE) 25 MG TABS tablet TAKE 1 TABLET(25 MG) BY MOUTH DAILY BEFORE AND BREAKFAST 90 tablet 3  . fluticasone (FLONASE) 50 MCG/ACT nasal spray Place into both nostrils daily.    Marland Kitchen levocetirizine (XYZAL) 5 MG tablet Take 5 mg by mouth every evening.    Marland Kitchen lisinopril-hydrochlorothiazide (ZESTORETIC) 10-12.5 MG tablet Take 1 tablet by mouth daily. 90 tablet 3  . metFORMIN (GLUCOPHAGE) 1000 MG tablet Take 1 tablet (1,000 mg total) by mouth daily with breakfast. 90 tablet 3  . montelukast (SINGULAIR) 10 MG tablet TAKE 1 TABLET(10 MG) BY MOUTH AT BEDTIME 90 tablet 1  . Multiple Vitamin (MULTIVITAMIN PO) Take by mouth.    . pioglitazone (ACTOS) 15 MG tablet Take 1 tablet (15 mg total) by mouth daily. 90 tablet 3  . Semaglutide, 1 MG/DOSE, (OZEMPIC, 1 MG/DOSE,) 2 MG/1.5ML SOPN Inject 1 mg into the skin once a week. 9 mL 3   No current facility-administered  medications on file prior to visit.    BP 120/60 (BP Location: Left Arm, Patient Position: Sitting, Cuff Size: Normal)   Pulse 100   Temp 98.3 F (36.8 C) (Oral)   Wt 211 lb 12.8 oz (96.1 kg)   SpO2 97%   BMI 31.28 kg/m chart    Objective:     BP 120/60 (BP Location: Left Arm, Patient Position: Sitting,  Cuff Size: Normal)   Pulse 100   Temp 98.3 F (36.8 C) (Oral)   Wt 211 lb 12.8 oz (96.1 kg)   SpO2 97%   BMI 31.28 kg/m    Physical Exam Constitutional:      Appearance: Normal appearance. He is obese.  HENT:     Head: Normocephalic.     Right Ear: Tympanic membrane, ear canal and external ear normal.     Left Ear: Tympanic membrane, ear canal and external ear normal.     Nose: Nose normal.     Mouth/Throat:     Mouth: Mucous membranes are moist.  Eyes:     Extraocular Movements: Extraocular movements intact.     Conjunctiva/sclera: Conjunctivae normal.     Pupils: Pupils are equal, round, and reactive to light.  Cardiovascular:     Rate and Rhythm: Normal rate and regular rhythm.  Abdominal:     General: Abdomen is flat. Bowel sounds are normal.     Palpations: Abdomen is soft. There is no mass.     Tenderness: There is no abdominal tenderness. There is no guarding.     Hernia: A hernia is present.  Musculoskeletal:        General: Normal range of motion.  Feet:     Right foot:     Skin integrity: Skin integrity normal. No ulcer or blister.     Left foot:     Skin integrity: Skin integrity normal. No ulcer or blister.     Comments: Feet skin intact.  Monofilament intact Skin:    General: Skin is warm and dry.  Neurological:     General: No focal deficit present.     Mental Status: He is alert and oriented to person, place, and time.  Psychiatric:        Mood and Affect: Mood normal.        Behavior: Behavior normal.    Results for orders placed or performed in visit on 01/27/23  POC HgB A1c  Result Value Ref Range   Hemoglobin A1C 7.0 (A) 4.0 - 5.6 %    HbA1c POC (<> result, manual entry)     HbA1c, POC (prediabetic range)     HbA1c, POC (controlled diabetic range)        The 10-year ASCVD risk score (Arnett DK, et al., 2019) is: 34.6%    Assessment & Plan:   Problem List Items Addressed This Visit     Type 2 diabetes mellitus with hyperglycemia - Primary   Relevant Orders   POC HgB A1c (Completed)   Basic Metabolic Panel   Hepatic Function Panel   Hyperlipidemia   Relevant Orders   Basic Metabolic Panel   CBC with Differential/Platelets   Hepatic Function Panel   Lipid panel   Essential hypertension   Relevant Orders   Basic Metabolic Panel   CBC with Differential/Platelets   Hepatic Function Panel   Hold on labs today.  Follow-up in 3 months.  Continue exercising and following a healthy diet. Return in about 3 months (around 04/28/2023).    Eulis Foster, FNP

## 2023-01-28 ENCOUNTER — Ambulatory Visit: Payer: Medicare Other | Admitting: Family Medicine

## 2023-01-28 ENCOUNTER — Encounter: Payer: Self-pay | Admitting: Family Medicine

## 2023-03-27 ENCOUNTER — Ambulatory Visit (INDEPENDENT_AMBULATORY_CARE_PROVIDER_SITE_OTHER): Payer: Medicare Other

## 2023-03-27 VITALS — BP 120/60 | HR 90 | Temp 98.6°F | Ht 69.0 in | Wt 209.7 lb

## 2023-03-27 DIAGNOSIS — Z0000 Encounter for general adult medical examination without abnormal findings: Secondary | ICD-10-CM

## 2023-03-27 NOTE — Progress Notes (Signed)
Subjective:   Richard Dalton is a 72 y.o. male who presents for Medicare Annual/Subsequent preventive examination.  Visit Complete: In person  Patient Medicare AWV questionnaire was completed by the patient on ;03/20/23 I have confirmed that all information answered by patient is correct and no changes since this date.  Review of Systems     Cardiac Risk Factors include: advanced age (>50men, >77 women);diabetes mellitus;male gender;hypertension     Objective:    Today's Vitals   03/27/23 0802  BP: 120/60  Pulse: 90  Temp: 98.6 F (37 C)  TempSrc: Oral  SpO2: 97%  Weight: 209 lb 11.2 oz (95.1 kg)  Height: 5\' 9"  (1.753 m)   Body mass index is 30.97 kg/m.     03/27/2023    8:30 AM 03/24/2022    8:40 AM 01/29/2021    8:19 AM 08/21/2015   10:14 AM 08/07/2015    3:26 PM  Advanced Directives  Does Patient Have a Medical Advance Directive? Yes Yes Yes Yes No  Type of Estate agent of [place];Living will Healthcare Power of [place];Living will Healthcare Power of [employer] Power of Attorney   Does patient want to make changes to medical advance directive? No - Patient declined No - Patient declined     Copy of Healthcare Power of Attorney in Chart? Yes - validated most recent copy scanned in chart (See row information) Yes - validated most recent copy scanned in chart (See row information) Yes - validated most recent copy scanned in chart (See row information)    Would patient like information on creating a medical advance directive?     No - patient declined information    Current Medications (verified) Outpatient Encounter Medications as of 03/27/2023  Medication Sig   atorvastatin (LIPITOR) 20 MG tablet Take 1 tablet (20 mg total) by mouth daily.   budesonide-formoterol (SYMBICORT) 160-4.5 MCG/ACT inhaler Inhale 2 puffs into the lungs 2 (two) times daily.   chlorpheniramine (CHLOR-TRIMETON) 4 MG tablet Take 4 mg by mouth every 8 (eight) hours.    empagliflozin (JARDIANCE) 25 MG TABS tablet TAKE 1 TABLET(25 MG) BY MOUTH DAILY BEFORE AND BREAKFAST   fluticasone (FLONASE) 50 MCG/ACT nasal spray Place into both nostrils daily.   levocetirizine (XYZAL) 5 MG tablet Take 5 mg by mouth every evening.   lisinopril-hydrochlorothiazide (ZESTORETIC) 10-12.5 MG tablet Take 1 tablet by mouth daily.   metFORMIN (GLUCOPHAGE) 1000 MG tablet Take 1 tablet (1,000 mg total) by mouth daily with breakfast.   montelukast (SINGULAIR) 10 MG tablet TAKE 1 TABLET(10 MG) BY MOUTH AT BEDTIME   Multiple Vitamin (MULTIVITAMIN PO) Take by mouth.   pioglitazone (ACTOS) 15 MG tablet Take 1 tablet (15 mg total) by mouth daily.   Semaglutide, 1 MG/DOSE, (OZEMPIC, 1 MG/DOSE,) 2 MG/1.5ML SOPN Inject 1 mg into the skin once a week.   No facility-administered encounter medications on file as of 03/27/2023.    Allergies (verified) Tetracycline   History: Past Medical History:  Diagnosis Date   ALLERGIC RHINITIS 11/12/2006   Allergy    Anemia    Asthma 2009   patient denies this dx   Chronic kidney disease    COPD (chronic obstructive pulmonary disease) (HCC)    DIABETES MELLITUS, TYPE II 08/07/2006   ECZEMA 11/08/2008   ERECTILE DYSFUNCTION 11/12/2006   GENERALIZED VACCINIA AS COMP MEDICAL CARE NEC 09/09/2010   GERD (gastroesophageal reflux disease)    Heart murmur    HYPERLIPIDEMIA 08/07/2006   HYPERTENSION 08/07/2006  Neoplasm of uncertain behavior of skin 02/07/2009   OSTEOARTHRITIS 08/07/2006   hands   PLANTAR FASCIITIS 11/12/2006   Sleep apnea    Past Surgical History:  Procedure Laterality Date   COLONOSCOPY  08/2015   [place]-polyps   KNEE ARTHROSCOPY     right   TOE SURGERY Right 09/22/2022   TOENAIL EXCISION  10/2019   TONSILLECTOMY AND ADENOIDECTOMY  1955   WISDOM TOOTH EXTRACTION     Family History  Problem Relation Age of Onset   Diabetes Mother    Alzheimer's disease Father        died age 7   Diabetes Sister        4 sisters  with diabetes   Diabetes Brother    Diabetes Daughter    Colon cancer Neg Hx    Esophageal cancer Neg Hx    Stomach cancer Neg Hx    Rectal cancer Neg Hx    Social History   Socioeconomic History   Marital status: Married    Spouse name: Not on file   Number of children: Not on file   Years of education: Not on file   Highest education level: Bachelor's degree (e.g., BA, AB, BS)  Occupational History    Comment: retired  Tobacco Use   Smoking status: Former    Packs/day: 2.00    Years: 20.00    Additional pack years: 0.00    Total pack years: 40.00    Types: Cigarettes    Quit date: 03/10/1987    Years since quitting: 36.0   Smokeless tobacco: Never  Vaping Use   Vaping Use: Never used  Substance and Sexual Activity   Alcohol use: Yes    Alcohol/week: 2.0 standard drinks of alcohol    Types: 2 Standard drinks or equivalent per week    Comment: weekly   Drug use: No   Sexual activity: Yes  Other Topics Concern   Not on file  Social History Narrative   Not on file   Social Determinants of Health   Financial Resource Strain: Low Risk  (03/27/2023)   Overall Financial Resource Strain (CARDIA)    Difficulty of Paying Living Expenses: Not hard at all  Food Insecurity: No Food Insecurity (03/27/2023)   Hunger Vital Sign    Worried About Running Out of Food in the Last Year: Never true    Ran Out of Food in the Last Year: Never true  Transportation Needs: No Transportation Needs (03/27/2023)   PRAPARE - Administrator, Civil Service (Medical): No    Lack of Transportation (Non-Medical): No  Physical Activity: Sufficiently Active (03/27/2023)   Exercise Vital Sign    Days of Exercise per Week: 6 days    Minutes of Exercise per Session: 30 min  Stress: No Stress Concern Present (03/27/2023)   [employer] of Occupational Health - Occupational Stress Questionnaire    Feeling of Stress : Not at all  Social Connections: Socially Integrated (03/27/2023)    Social Connection and Isolation Panel [NHANES]    Frequency of Communication with Friends and Family: More than three times a week    Frequency of Social Gatherings with Friends and Family: More than three times a week    Attends Religious Services: More than 4 times per year    Active Member of [employer] or Organizations: Yes    Attends Engineer, structural: More than 4 times per year    Marital Status: Married    Tobacco Counseling Counseling  given: Not Answered   Clinical Intake:  Pre-visit preparation completed: Yes  Pain : No/denies pain     BMI - recorded: 30.97 Nutritional Status: BMI > 30  Obese Nutritional Risks: None Diabetes: Yes CBG done?: No Did pt. bring in CBG monitor from home?: No  How often do you need to have someone help you when you read instructions, pamphlets, or other written materials from your doctor or pharmacy?: 1 - Never  Interpreter Needed?: No  Information entered by :: Theresa Mulligan LPN   Activities of Daily Living    03/27/2023    8:29 AM 03/20/2023    3:11 PM  In your present state of health, do you have any difficulty performing the following activities:  Hearing? 0 0  Vision? 0 0  Difficulty concentrating or making decisions? 0 0  Walking or climbing stairs? 0 0  Dressing or bathing? 0 0  Doing errands, shopping? 0 0  Preparing Food and eating ? N N  Using the Toilet? N N  In the past six months, have you accidently leaked urine? N N  Do you have problems with loss of bowel control? N N  Managing your Medications? N N  Managing your Finances? N N  Housekeeping or managing your Housekeeping? N N    Patient Care Team: Kristian Covey, MD as PCP - General  Indicate any recent Medical Services you may have received from other than [facility] providers in the past year (date may be approximate).     Assessment:   This is a routine wellness examination for [place].  Hearing/Vision screen Hearing Screening - Comments::  Denies hearing difficulties   Vision Screening - Comments:: Wears rx glasses - up to date with routine eye exams with  Dr Emily Filbert  Dietary issues and exercise activities discussed:     Goals Addressed               This Visit's Progress     Lose weight (pt-stated)        Continue to lose weight.       Depression Screen    03/27/2023    8:10 AM 01/27/2023    8:15 AM 10/29/2022    7:23 AM 03/24/2022    8:37 AM 11/15/2021    7:02 AM 01/29/2021    8:18 AM 10/17/2020    9:54 AM  PHQ 2/9 Scores  PHQ - 2 Score 0 0 0 0 0 0 0    Fall Risk    03/27/2023    8:29 AM 03/20/2023    3:11 PM 01/27/2023    8:16 AM 01/21/2023    9:14 PM 10/29/2022    7:24 AM  Fall Risk   Falls in the past year? 0 0 0 0 0  Number falls in past yr: 0 0 0  0  Injury with Fall? 0 0 0  0  Risk for fall due to : No Fall Risks  No Fall Risks  No Fall Risks  Follow up Falls prevention discussed  Falls evaluation completed  Falls evaluation completed    MEDICARE RISK AT HOME:  Medicare Risk at Home - 03/27/23 0981     Any stairs in or around the home? Yes    If so, are there any without handrails? No    Home free of loose throw rugs in walkways, pet beds, electrical cords, etc? Yes    Adequate lighting in your home to reduce risk of falls? Yes    Life alert?  No    Use of a cane, walker or w/c? No    Grab bars in the bathroom? Yes    Shower chair or bench in shower? Yes    Elevated toilet seat or a handicapped toilet? No             TIMED UP AND GO:  Was the test performed?  Yes  Length of time to ambulate 10 feet: 10 sec Gait steady and fast without use of assistive device    Cognitive Function:        03/27/2023    8:30 AM 03/24/2022    8:41 AM 01/29/2021    8:23 AM  6CIT Screen  What Year? 0 points 0 points 0 points  What month? 0 points 0 points 0 points  What time? 0 points 0 points   Count back from 20 0 points 0 points 0 points  Months in reverse 0 points 0 points 0 points  Repeat  phrase 0 points 0 points 0 points  Total Score 0 points 0 points     Immunizations Immunization History  Administered Date(s) Administered   Covid-19, Mrna,Vaccine(Spikevax)48yrs and older 01/07/2023   Fluad Quad(high Dose 65+) 06/05/2021, 05/23/2022   Influenza Split 07/10/2011, 06/04/2013   Influenza Whole 07/02/2006, 08/11/2007, 07/24/2008, 07/05/2010, 06/27/2012   Influenza, High Dose Seasonal PF 06/17/2016, 07/11/2017, 05/28/2019   Influenza-Unspecified 06/03/2014, 06/10/2015, 07/11/2017, 05/22/2018, 05/28/2019, 06/12/2020   Moderna Covid-19 Vaccine Bivalent Booster 20yrs & up 08/26/2021   Moderna Sars-Covid-2 Vaccination 10/12/2019, 11/09/2019, 05/20/2020, 07/18/2020   PFIZER Comirnaty(Gray Top)Covid-19 Tri-Sucrose Vaccine 07/10/2022   Pneumococcal Conjugate-13 09/13/2014   Pneumococcal Polysaccharide-23 10/07/2007, 10/17/2016   Rsv, Bivalent, Protein Subunit Rsvpref,pf Verdis Frederickson) 06/11/2022   Td 10/07/2003   Tdap 06/14/2015   Zoster Recombinat (Shingrix) 05/26/2001, 05/26/2021   Zoster, Live 11/05/2013    TDAP status: Up to date  Flu Vaccine status: Up to date  Pneumococcal vaccine status: Up to date  Covid-19 vaccine status: Completed vaccines  Qualifies for Shingles Vaccine? Yes   Zostavax completed Yes    Shingrix Completed?: Yes  Screening Tests Health Maintenance  Topic Date Due   FOOT EXAM  11/06/2022   COVID-19 Vaccine (8 - 2023-24 season) 04/12/2023 (Originally 03/04/2023)   Diabetic kidney evaluation - eGFR measurement  06/27/2023 (Originally 11/13/2021)   Diabetic kidney evaluation - Urine ACR  10/30/2027 (Originally 04/06/2011)   INFLUENZA VACCINE  05/07/2023   OPHTHALMOLOGY EXAM  07/22/2023   HEMOGLOBIN A1C  07/29/2023   Medicare Annual Wellness (AWV)  03/26/2024   DTaP/Tdap/Td (3 - Td or Tdap) 06/13/2025   Colonoscopy  04/11/2027   Pneumonia Vaccine 15+ Years old  Completed   Hepatitis C Screening  Completed   Zoster Vaccines- Shingrix  Completed    HPV VACCINES  Aged Out    Health Maintenance  Health Maintenance Due  Topic Date Due   FOOT EXAM  11/06/2022    Colorectal cancer screening: Type of screening: Colonoscopy. Completed 04/10/22. Repeat every 5 years  Lung Cancer Screening: (Low Dose CT Chest recommended if Age 63-80 years, 20 pack-year currently smoking OR have quit w/in 15years.) does not qualify.     Additional Screening:  Hepatitis C Screening: does qualify; Completed 02/17/18  Vision Screening: Recommended annual ophthalmology exams for early detection of glaucoma and other disorders of the eye. Is the patient up to date with their annual eye exam?  Yes  Who is the provider or what is the name of the office in which the patient attends annual eye  exams? Dr Emily Filbert If pt is not established with a provider, would they like to be referred to a provider to establish care? No .   Dental Screening: Recommended annual dental exams for proper oral hygiene  Diabetic Foot Exam: Diabetic Foot Exam: Overdue, Pt has been advised about the importance in completing this exam. Pt is scheduled for diabetic foot exam on Followed by PCP.  Community Resource Referral / Chronic Care Management:  CRR required this visit?  No   CCM required this visit?  No     Plan:     I have personally reviewed and noted the following in the patient's chart:   Medical and social history Use of alcohol, tobacco or illicit drugs  Current medications and supplements including opioid prescriptions. Patient is not currently taking opioid prescriptions. Functional ability and status Nutritional status Physical activity Advanced directives List of other physicians Hospitalizations, surgeries, and ER visits in previous 12 months Vitals Screenings to include cognitive, depression, and falls Referrals and appointments  In addition, I have reviewed and discussed with patient certain preventive protocols, quality metrics, and best practice  recommendations. A written personalized care plan for preventive services as well as general preventive health recommendations were provided to patient.     Tillie Rung, LPN   [id number]   After Visit Summary: Given  Nurse Notes: Patient due Diabetic kidney evaluation-eGFR measurement

## 2023-03-27 NOTE — Patient Instructions (Addendum)
Mr. Richard Dalton , Thank you for taking time to come for your Medicare Wellness Visit. I appreciate your ongoing commitment to your health goals. Please review the following plan we discussed and let me know if I can assist you in the future.   These are the goals we discussed:  Goals       Lose weight (pt-stated)      Continue to lose weight.      Patient Stated (pt-stated)      Stay healthy        This is a list of the screening recommended for you and due dates:  Health Maintenance  Topic Date Due   Complete foot exam   11/06/2022   COVID-19 Vaccine (8 - 2023-24 season) 04/12/2023*   Yearly kidney function blood test for diabetes  06/27/2023*   Yearly kidney health urinalysis for diabetes  10/30/2027*   Flu Shot  05/07/2023   Eye exam for diabetics  07/22/2023   Hemoglobin A1C  07/29/2023   Medicare Annual Wellness Visit  03/26/2024   DTaP/Tdap/Td vaccine (3 - Td or Tdap) 06/13/2025   Colon Cancer Screening  04/11/2027   Pneumonia Vaccine  Completed   Hepatitis C Screening  Completed   Zoster (Shingles) Vaccine  Completed   HPV Vaccine  Aged Out  *Topic was postponed. The date shown is not the original due date.    Advanced directives: In Chart  Conditions/risks identified: None  Next appointment: Follow up in one year for your annual wellness visit.   Preventive Care 36 Years and Older, Male  Preventive care refers to lifestyle choices and visits with your health care provider that can promote health and wellness. What does preventive care include? A yearly physical exam. This is also called an annual well check. Dental exams once or twice a year. Routine eye exams. Ask your health care provider how often you should have your eyes checked. Personal lifestyle choices, including: Daily care of your teeth and gums. Regular physical activity. Eating a healthy diet. Avoiding tobacco and drug use. Limiting alcohol use. Practicing safe sex. Taking low doses of aspirin  every day. Taking vitamin and mineral supplements as recommended by your health care provider. What happens during an annual well check? The services and screenings done by your health care provider during your annual well check will depend on your age, overall health, lifestyle risk factors, and family history of disease. Counseling  Your health care provider may ask you questions about your: Alcohol use. Tobacco use. Drug use. Emotional well-being. Home and relationship well-being. Sexual activity. Eating habits. History of falls. Memory and ability to understand (cognition). Work and work Astronomer. Screening  You may have the following tests or measurements: Height, weight, and BMI. Blood pressure. Lipid and cholesterol levels. These may be checked every 5 years, or more frequently if you are over 95 years old. Skin check. Lung cancer screening. You may have this screening every year starting at age 54 if you have a 30-pack-year history of smoking and currently smoke or have quit within the past 15 years. Fecal occult blood test (FOBT) of the stool. You may have this test every year starting at age 71. Flexible sigmoidoscopy or colonoscopy. You may have a sigmoidoscopy every 5 years or a colonoscopy every 10 years starting at age 42. Prostate cancer screening. Recommendations will vary depending on your family history and other risks. Hepatitis C blood test. Hepatitis B blood test. Sexually transmitted disease (STD) testing. Diabetes screening. This is  done by checking your blood sugar (glucose) after you have not eaten for a while (fasting). You may have this done every 1-3 years. Abdominal aortic aneurysm (AAA) screening. You may need this if you are a current or former smoker. Osteoporosis. You may be screened starting at age 55 if you are at high risk. Talk with your health care provider about your test results, treatment options, and if necessary, the need for more  tests. Vaccines  Your health care provider may recommend certain vaccines, such as: Influenza vaccine. This is recommended every year. Tetanus, diphtheria, and acellular pertussis (Tdap, Td) vaccine. You may need a Td booster every 10 years. Zoster vaccine. You may need this after age 50. Pneumococcal 13-valent conjugate (PCV13) vaccine. One dose is recommended after age 84. Pneumococcal polysaccharide (PPSV23) vaccine. One dose is recommended after age 31. Talk to your health care provider about which screenings and vaccines you need and how often you need them. This information is not intended to replace advice given to you by your health care provider. Make sure you discuss any questions you have with your health care provider. Document Released: 10/19/2015 Document Revised: 06/11/2016 Document Reviewed: 07/24/2015 Elsevier Interactive Patient Education  2017 [place] Prevention in the Home Falls can cause injuries. They can happen to people of all ages. There are many things you can do to make your home safe and to help prevent falls. What can I do on the outside of my home? Regularly fix the edges of walkways and driveways and fix any cracks. Remove anything that might make you trip as you walk through a door, such as a raised step or threshold. Trim any bushes or trees on the path to your home. Use bright outdoor lighting. Clear any walking paths of anything that might make someone trip, such as rocks or tools. Regularly check to see if handrails are loose or broken. Make sure that both sides of any steps have handrails. Any raised decks and porches should have guardrails on the edges. Have any leaves, snow, or ice cleared regularly. Use sand or salt on walking paths during winter. Clean up any spills in your garage right away. This includes oil or grease spills. What can I do in the bathroom? Use night lights. Install grab bars by the toilet and in the tub and shower.  Do not use towel bars as grab bars. Use non-skid mats or decals in the tub or shower. If you need to sit down in the shower, use a plastic, non-slip stool. Keep the floor dry. Clean up any water that spills on the floor as soon as it happens. Remove soap buildup in the tub or shower regularly. Attach bath mats securely with double-sided non-slip rug tape. Do not have throw rugs and other things on the floor that can make you trip. What can I do in the bedroom? Use night lights. Make sure that you have a light by your bed that is easy to reach. Do not use any sheets or blankets that are too big for your bed. They should not hang down onto the floor. Have a firm chair that has side arms. You can use this for support while you get dressed. Do not have throw rugs and other things on the floor that can make you trip. What can I do in the kitchen? Clean up any spills right away. Avoid walking on wet floors. Keep items that you use a lot in easy-to-reach places. If you need  to reach something above you, use a strong step stool that has a grab bar. Keep electrical cords out of the way. Do not use floor polish or wax that makes floors slippery. If you must use wax, use non-skid floor wax. Do not have throw rugs and other things on the floor that can make you trip. What can I do with my stairs? Do not leave any items on the stairs. Make sure that there are handrails on both sides of the stairs and use them. Fix handrails that are broken or loose. Make sure that handrails are as long as the stairways. Check any carpeting to make sure that it is firmly attached to the stairs. Fix any carpet that is loose or worn. Avoid having throw rugs at the top or bottom of the stairs. If you do have throw rugs, attach them to the floor with carpet tape. Make sure that you have a light switch at the top of the stairs and the bottom of the stairs. If you do not have them, ask someone to add them for you. What else  can I do to help prevent falls? Wear shoes that: Do not have high heels. Have rubber bottoms. Are comfortable and fit you well. Are closed at the toe. Do not wear sandals. If you use a stepladder: Make sure that it is fully opened. Do not climb a closed stepladder. Make sure that both sides of the stepladder are locked into place. Ask someone to hold it for you, if possible. Clearly mark and make sure that you can see: Any grab bars or handrails. First and last steps. Where the edge of each step is. Use tools that help you move around (mobility aids) if they are needed. These include: Canes. Walkers. Scooters. Crutches. Turn on the lights when you go into a dark area. Replace any light bulbs as soon as they burn out. Set up your furniture so you have a clear path. Avoid moving your furniture around. If any of your floors are uneven, fix them. If there are any pets around you, be aware of where they are. Review your medicines with your doctor. Some medicines can make you feel dizzy. This can increase your chance of falling. Ask your doctor what other things that you can do to help prevent falls. This information is not intended to replace advice given to you by your health care provider. Make sure you discuss any questions you have with your health care provider. Document Released: 07/19/2009 Document Revised: 02/28/2016 Document Reviewed: 10/27/2014 Elsevier Interactive Patient Education  2017 [employer].

## 2023-04-11 ENCOUNTER — Other Ambulatory Visit: Payer: Self-pay | Admitting: Family Medicine

## 2023-04-13 ENCOUNTER — Encounter: Payer: Self-pay | Admitting: Family Medicine

## 2023-04-29 ENCOUNTER — Ambulatory Visit (INDEPENDENT_AMBULATORY_CARE_PROVIDER_SITE_OTHER): Payer: Medicare Other | Admitting: Family Medicine

## 2023-04-29 ENCOUNTER — Encounter: Payer: Self-pay | Admitting: Family Medicine

## 2023-04-29 VITALS — BP 110/60 | HR 100 | Temp 98.2°F | Ht 69.0 in | Wt 208.0 lb

## 2023-04-29 DIAGNOSIS — E785 Hyperlipidemia, unspecified: Secondary | ICD-10-CM

## 2023-04-29 DIAGNOSIS — E1165 Type 2 diabetes mellitus with hyperglycemia: Secondary | ICD-10-CM

## 2023-04-29 DIAGNOSIS — I10 Essential (primary) hypertension: Secondary | ICD-10-CM

## 2023-04-29 DIAGNOSIS — J301 Allergic rhinitis due to pollen: Secondary | ICD-10-CM

## 2023-04-29 DIAGNOSIS — Z7984 Long term (current) use of oral hypoglycemic drugs: Secondary | ICD-10-CM

## 2023-04-29 DIAGNOSIS — Z7985 Long-term (current) use of injectable non-insulin antidiabetic drugs: Secondary | ICD-10-CM

## 2023-04-29 LAB — POCT GLYCOSYLATED HEMOGLOBIN (HGB A1C): Hemoglobin A1C: 6.8 % — AB (ref 4.0–5.6)

## 2023-04-29 MED ORDER — OZEMPIC (1 MG/DOSE) 2 MG/1.5ML ~~LOC~~ SOPN: 1.0000 mg | PEN_INJECTOR | SUBCUTANEOUS | 3 refills | Status: DC

## 2023-04-29 MED ORDER — MONTELUKAST SODIUM 10 MG PO TABS: ORAL_TABLET | ORAL | 1 refills | Status: DC

## 2023-04-29 NOTE — Progress Notes (Signed)
Established Patient Office Visit  Subjective   Patient ID: Richard Dalton, male    DOB: 1951/06/09  Age: 72 y.o. MRN: [id number]  Chief Complaint  Patient presents with   Medical Management of Chronic Issues    HPI   Danial is seen for routine medical follow-up.  He has history of hypertension which is treated with lisinopril HCTZ, type 2 diabetes treated with Jardiance, Ozempic, metformin, and Actos.  He is currently on Ozempic 1 mg subcutaneous once weekly.  His A1c's have been slowly improving on Ozempic.  He has chronic kidney disease followed by nephrology.  Most recent creatinine 1.55 with a GFR of 47.  He has hyperlipidemia treated with atorvastatin.  He had lipids in January which were stable and fairly well-controlled with LDL cholesterol of 73.  Generally doing well.  He does request refills today of Ozempic and Singulair.  Denies any recent chest pains.  No dyspnea.  Compliant with all medications.  Past Medical History:  Diagnosis Date   ALLERGIC RHINITIS 11/12/2006   Allergy    Anemia    Asthma 2009   patient denies this dx   Chronic kidney disease    COPD (chronic obstructive pulmonary disease) (HCC)    DIABETES MELLITUS, TYPE II 08/07/2006   ECZEMA 11/08/2008   ERECTILE DYSFUNCTION 11/12/2006   GENERALIZED VACCINIA AS COMP MEDICAL CARE NEC 09/09/2010   GERD (gastroesophageal reflux disease)    Heart murmur    HYPERLIPIDEMIA 08/07/2006   HYPERTENSION 08/07/2006   Neoplasm of uncertain behavior of skin 02/07/2009   OSTEOARTHRITIS 08/07/2006   hands   PLANTAR FASCIITIS 11/12/2006   Sleep apnea    Past Surgical History:  Procedure Laterality Date   COLONOSCOPY  08/2015   Perry-polyps   KNEE ARTHROSCOPY     right   TOE SURGERY Right 09/22/2022   TOENAIL EXCISION  10/2019   TONSILLECTOMY AND ADENOIDECTOMY  1955   WISDOM TOOTH EXTRACTION      reports that he quit smoking about 36 years ago. His smoking use included cigarettes. He started smoking about 56  years ago. He has a 40 pack-year smoking history. He has never used smokeless tobacco. He reports current alcohol use of about 2.0 standard drinks of alcohol per week. He reports that he does not use drugs. family history includes Alzheimer's disease in his father; Diabetes in his brother, daughter, mother, and sister. Allergies  Allergen Reactions   Tetracycline     REACTION: rash    Review of Systems  Constitutional:  Negative for malaise/fatigue.  Eyes:  Negative for blurred vision.  Respiratory:  Negative for shortness of breath.   Cardiovascular:  Negative for chest pain.  Neurological:  Negative for dizziness, weakness and headaches.      Objective:     BP 110/60 (BP Location: Left Arm, Patient Position: Sitting, Cuff Size: Normal)   Pulse 100   Temp 98.2 F (36.8 C) (Oral)   Ht 5\' 9"  (1.753 m)   Wt 208 lb (94.3 kg)   SpO2 99%   BMI 30.72 kg/m  BP Readings from Last 3 Encounters:  04/29/23 110/60  03/27/23 120/60  01/27/23 120/60   Wt Readings from Last 3 Encounters:  04/29/23 208 lb (94.3 kg)  03/27/23 209 lb 11.2 oz (95.1 kg)  01/27/23 211 lb 12.8 oz (96.1 kg)      Physical Exam Vitals reviewed.  Constitutional:      Appearance: He is well-developed.  Eyes:     Pupils: Pupils are equal,  round, and reactive to light.  Neck:     Thyroid: No thyromegaly.  Cardiovascular:     Rate and Rhythm: Normal rate and regular rhythm.  Pulmonary:     Effort: Pulmonary effort is normal. No respiratory distress.     Breath sounds: Normal breath sounds. No wheezing or rales.  Musculoskeletal:     Cervical back: Neck supple.  Neurological:     Mental Status: He is alert and oriented to person, place, and time.      Results for orders placed or performed in visit on 04/29/23  POC HgB A1c  Result Value Ref Range   Hemoglobin A1C 6.8 (A) 4.0 - 5.6 %   HbA1c POC (<> result, manual entry)     HbA1c, POC (prediabetic range)     HbA1c, POC (controlled diabetic range)       Last CBC Lab Results  Component Value Date   WBC 5.6 11/20/2017   HGB 15.2 11/20/2017   HCT 45.8 11/20/2017   MCV 94.2 11/20/2017   RDW 14.2 11/20/2017   PLT 231.0 11/20/2017   Last metabolic panel Lab Results  Component Value Date   GLUCOSE 132 (H) 11/13/2020   NA 139 11/13/2020   K 4.2 11/13/2020   CL 99 11/13/2020   CO2 35 (H) 11/13/2020   BUN 24 (H) 11/13/2020   CREATININE 1.52 (H) 11/13/2020   GFR 45.97 (L) 11/13/2020   CALCIUM 9.4 11/13/2020   PROT 6.9 10/31/2022   ALBUMIN 4.0 10/31/2022   BILITOT 0.6 10/31/2022   ALKPHOS 88 10/31/2022   AST 19 10/31/2022   ALT 19 10/31/2022   Last lipids Lab Results  Component Value Date   CHOL 148 10/31/2022   HDL 54.40 10/31/2022   LDLCALC 73 10/31/2022   LDLDIRECT 80.0 11/15/2019   TRIG 104.0 10/31/2022   CHOLHDL 3 10/31/2022   Last hemoglobin A1c Lab Results  Component Value Date   HGBA1C 6.8 (A) 04/29/2023      The 10-year ASCVD risk score (Arnett DK, et al., 2019) is: 30.5%    Assessment & Plan:   #1 type 2 diabetes controlled with A1c 6.8%.  Continue Ozempic, Actos, metformin, Jardiance.  We did discuss the fact that we could titrate Ozempic to 2 mg but this would be more for weight control.  We have elected to keep dosage same and reassess in 3 to 4 months.  Continue yearly diabetic eye exam.  #2 hypertension stable and well-controlled with lisinopril HCTZ.  Will need electrolytes soon.  He plans to see nephrologist in September and will get basic metabolic panel then  #3 history of seasonal and perennial allergies.  Patient requesting refill Singulair.  This will be refilled for 6 months  #4 hyperlipidemia treated with atorvastatin.  Recent LDL cholesterol 73.  Continue atorvastatin 20 mg daily.  Continue low saturated fat diet. Return in about 3 months (around 07/30/2023).    Evelena Peat, MD

## 2023-05-07 ENCOUNTER — Encounter: Payer: Self-pay | Admitting: Family Medicine

## 2023-05-07 NOTE — Telephone Encounter (Signed)
Please advise if it is ok to schedule pt for an ov or to place orders

## 2023-05-13 ENCOUNTER — Ambulatory Visit: Payer: Medicare Other | Admitting: Podiatry

## 2023-05-13 DIAGNOSIS — Q667 Congenital pes cavus, unspecified foot: Secondary | ICD-10-CM

## 2023-05-13 DIAGNOSIS — M2021 Hallux rigidus, right foot: Secondary | ICD-10-CM

## 2023-05-13 DIAGNOSIS — E119 Type 2 diabetes mellitus without complications: Secondary | ICD-10-CM | POA: Diagnosis not present

## 2023-05-13 DIAGNOSIS — Q6671 Congenital pes cavus, right foot: Secondary | ICD-10-CM

## 2023-05-13 DIAGNOSIS — Q6672 Congenital pes cavus, left foot: Secondary | ICD-10-CM

## 2023-05-13 NOTE — Progress Notes (Signed)
Subjective: Richard Dalton presents today referred by Kristian Covey, MD for diabetic foot evaluation.  Patient relates many year history of diabetes.  Patient denies any history of foot wounds.  Patient denies any history of numbness, tingling, burning, pins/needles sensations.  Past Medical History:  Diagnosis Date   ALLERGIC RHINITIS 11/12/2006   Allergy    Anemia    Asthma 2009   patient denies this dx   Chronic kidney disease    COPD (chronic obstructive pulmonary disease) (HCC)    DIABETES MELLITUS, TYPE II 08/07/2006   ECZEMA 11/08/2008   ERECTILE DYSFUNCTION 11/12/2006   GENERALIZED VACCINIA AS COMP MEDICAL CARE NEC 09/09/2010   GERD (gastroesophageal reflux disease)    Heart murmur    HYPERLIPIDEMIA 08/07/2006   HYPERTENSION 08/07/2006   Neoplasm of uncertain behavior of skin 02/07/2009   OSTEOARTHRITIS 08/07/2006   hands   PLANTAR FASCIITIS 11/12/2006   Sleep apnea     Patient Active Problem List   Diagnosis Date Noted   CKD (chronic kidney disease) stage 3, GFR 30-59 ml/min (HCC) 03/14/2020   Obesity (BMI 30-39.9) 09/14/2013   GENERALIZED VACCINIA AS COMP MEDICAL CARE NEC 09/09/2010   NEOPLASM OF UNCERTAIN BEHAVIOR OF SKIN 02/07/2009   ECZEMA 11/08/2008   EXTRINSIC ASTHMA, UNSPECIFIED 05/11/2008   ERECTILE DYSFUNCTION 11/12/2006   Allergic rhinitis 11/12/2006   Type 2 diabetes mellitus with hyperglycemia (HCC) 08/07/2006   Hyperlipidemia 08/07/2006   Essential hypertension 08/07/2006   Osteoarthritis 08/07/2006    Past Surgical History:  Procedure Laterality Date   COLONOSCOPY  08/2015   Perry-polyps   KNEE ARTHROSCOPY     right   TOE SURGERY Right 09/22/2022   TOENAIL EXCISION  10/2019   TONSILLECTOMY AND ADENOIDECTOMY  1955   WISDOM TOOTH EXTRACTION      Current Outpatient Medications on File Prior to Visit  Medication Sig Dispense Refill   atorvastatin (LIPITOR) 20 MG tablet Take 1 tablet (20 mg total) by mouth daily. 90 tablet 2    budesonide-formoterol (SYMBICORT) 160-4.5 MCG/ACT inhaler Inhale 2 puffs into the lungs 2 (two) times daily. 1 each 3   chlorpheniramine (CHLOR-TRIMETON) 4 MG tablet Take 4 mg by mouth every 8 (eight) hours.     fluticasone (FLONASE) 50 MCG/ACT nasal spray Place into both nostrils daily.     JARDIANCE 25 MG TABS tablet TAKE 1 TABLET BY MOUTH EVERY DAY BEFORE BREAKFAST 90 tablet 1   levocetirizine (XYZAL) 5 MG tablet Take 5 mg by mouth every evening.     lisinopril-hydrochlorothiazide (ZESTORETIC) 10-12.5 MG tablet Take 1 tablet by mouth daily. 90 tablet 3   metFORMIN (GLUCOPHAGE) 1000 MG tablet Take 1 tablet (1,000 mg total) by mouth daily with breakfast. 90 tablet 3   montelukast (SINGULAIR) 10 MG tablet TAKE 1 TABLET(10 MG) BY MOUTH AT BEDTIME 90 tablet 1   Multiple Vitamin (MULTIVITAMIN PO) Take by mouth.     pioglitazone (ACTOS) 15 MG tablet Take 1 tablet (15 mg total) by mouth daily. 90 tablet 3   Semaglutide, 1 MG/DOSE, (OZEMPIC, 1 MG/DOSE,) 2 MG/1.5ML SOPN Inject 1 mg into the skin once a week. 9 mL 3   No current facility-administered medications on file prior to visit.     Allergies  Allergen Reactions   Tetracycline     REACTION: rash    Social History   Occupational History    Comment: retired  Tobacco Use   Smoking status: Former    Current packs/day: 0.00    Average packs/day: 2.0 packs/day  for 20.0 years (40.0 ttl pk-yrs)    Types: Cigarettes    Start date: 03/10/1967    Quit date: 03/10/1987    Years since quitting: 36.2   Smokeless tobacco: Never  Vaping Use   Vaping status: Never Used  Substance and Sexual Activity   Alcohol use: Yes    Alcohol/week: 2.0 standard drinks of alcohol    Types: 2 Standard drinks or equivalent per week    Comment: weekly   Drug use: No   Sexual activity: Yes    Family History  Problem Relation Age of Onset   Diabetes Mother    Alzheimer's disease Father        died age 82   Diabetes Sister        4 sisters with diabetes    Diabetes Brother    Diabetes Daughter    Colon cancer Neg Hx    Esophageal cancer Neg Hx    Stomach cancer Neg Hx    Rectal cancer Neg Hx     Immunization History  Administered Date(s) Administered   Covid-19, Mrna,Vaccine(Spikevax)56yrs and older 01/07/2023   Fluad Quad(high Dose 65+) 06/05/2021, 05/23/2022   Influenza Split 07/10/2011, 06/04/2013   Influenza Whole 07/02/2006, 08/11/2007, 07/24/2008, 07/05/2010, 06/27/2012   Influenza, High Dose Seasonal PF 06/17/2016, 07/11/2017, 05/28/2019   Influenza-Unspecified 06/03/2014, 06/10/2015, 07/11/2017, 05/22/2018, 05/28/2019, 06/12/2020   Moderna Covid-19 Vaccine Bivalent Booster 17yrs & up 08/26/2021   Moderna Sars-Covid-2 Vaccination 10/12/2019, 11/09/2019, 05/20/2020, 07/18/2020   PFIZER Comirnaty(Gray Top)Covid-19 Tri-Sucrose Vaccine 07/10/2022   Pneumococcal Conjugate-13 09/13/2014   Pneumococcal Polysaccharide-23 10/07/2007, 10/17/2016   Rsv, Bivalent, Protein Subunit Rsvpref,pf Verdis Frederickson) 06/11/2022   Td 10/07/2003   Tdap 06/14/2015   Zoster Recombinant(Shingrix) 05/26/2001, 05/26/2021   Zoster, Live 11/05/2013    Review of systems: Positive Findings in bold print.  Constitutional:  chills, fatigue, fever, sweats, weight change Communication: Nurse, learning disability, sign Presenter, broadcasting, hand writing, iPad/Android device Head: headaches, head injury Eyes: changes in vision, eye pain, glaucoma, cataracts, macular degeneration, diplopia, glare,  light sensitivity, eyeglasses or contacts, blindness Ears nose mouth throat: hearing impaired, hearing aids,  ringing in ears, deaf, sign language,  vertigo, nosebleeds,  rhinitis,  cold sores, snoring, swollen glands Cardiovascular: HTN, edema, arrhythmia, pacemaker in place, defibrillator in place, chest pain/tightness, chronic anticoagulation, blood clot, heart failure, MI Peripheral Vascular: leg cramps, varicose veins, blood clots, lymphedema, varicosities Respiratory:  asthma, difficulty  breathing, denies congestion, SOB, wheezing, cough, emphysema Gastrointestinal: change in appetite or weight, abdominal pain, constipation, diarrhea, nausea, vomiting, vomiting blood, change in bowel habits, abdominal pain, jaundice, rectal bleeding, hemorrhoids, GERD Genitourinary:  nocturia,  pain on urination, polyuria,  blood in urine, Foley catheter, urinary urgency, ESRD on hemodialysis Musculoskeletal: amputation, cramping, stiff joints, painful joints, decreased joint motion, fractures, OA, gout, hemiplegia, paraplegia, uses cane, wheelchair bound, uses walker, uses rollator Skin: +changes in toenails, color change, dryness, itching, mole changes,  rash, wound(s) Neurological: headaches, numbness in feet, paresthesias in feet, burning in feet, fainting,  seizures, change in speech, migraines, memory problems/poor historian, cerebral palsy, weakness, paralysis, CVA, TIA Endocrine: diabetes, hypothyroidism, hyperthyroidism,  goiter, dry mouth, flushing, heat intolerance, cold intolerance,  excessive thirst, denies polyuria,  nocturia Hematological:  easy bleeding, excessive bleeding, easy bruising, enlarged lymph nodes, on long term blood thinner, history of past transusions Allergy/immunological:  hives, eczema, frequent infections, multiple drug allergies, seasonal allergies, transplant recipient, multiple food allergies Psychiatric:  anxiety, depression, mood disorder, suicidal ideations, hallucinations, insomnia  Objective: There were no vitals filed for this  visit. Vascular Examination: Capillary refill time less than 3 seconds x 10 digits.  Dorsalis pedis pulses palpable 2 out of 4.  Posterior tibial pulses palpable 2 out of 4.  Digital hair not present x 10 digits.  Skin temperature gradient WNL b/l.  Dermatological Examination: Skin with normal turgor, texture and tone b/l  Toenails 1-5 b/l discolored, thick, dystrophic with subungual debris and pain with palpation to  nailbeds due to thickness of nails.  Musculoskeletal: Muscle strength 5/5 to all LE muscle groups.  Pes cavus foot structure noted with a rigid first metatarsophalangeal joint with a history of fusion  Neurological: Sensation intact with 10 gram monofilament.  Vibratory sensation intact.  Assessment: NIDDM Encounter for diabetic foot examination Pes cavus  Plan: Discussed diabetic foot care principles. Literature dispensed on today. Patient to continue soft, supportive shoe gear daily. Patient to report any pedal injuries to medical professional immediately. Follow up one year. Patient/POA to call should there be a concern in the interim.  I explained to patient etiology of pes cavus and wrist treatment options were discussed.  Given that patient's previous probe orthotics give him considerable support I believe he will benefit from another pair of orthotics with Morton's extension.  Sophomore Soft Morton's extension will be applied bilaterally.

## 2023-05-25 ENCOUNTER — Ambulatory Visit: Payer: Medicare Other

## 2023-05-25 NOTE — Progress Notes (Signed)
Patient presents today to pick up custom molded foot orthotics, diagnosed with Pes Cavus  by Dr. Hunt Oris .   Orthotics were dispensed and fit was satisfactory. Reviewed instructions for break-in and wear. Written instructions given to patient.  Patient will follow up as needed.   Richard Dalton Cped CFo, CFm

## 2023-06-13 ENCOUNTER — Encounter: Payer: Self-pay | Admitting: Family Medicine

## 2023-07-09 ENCOUNTER — Other Ambulatory Visit: Payer: Self-pay | Admitting: Family Medicine

## 2023-07-19 ENCOUNTER — Other Ambulatory Visit: Payer: Self-pay | Admitting: Family Medicine

## 2023-07-20 ENCOUNTER — Encounter: Payer: Self-pay | Admitting: Family Medicine

## 2023-07-20 MED ORDER — PIOGLITAZONE HCL 15 MG PO TABS: 15.0000 mg | ORAL_TABLET | Freq: Every day | ORAL | 1 refills | Status: DC

## 2023-07-27 DIAGNOSIS — E119 Type 2 diabetes mellitus without complications: Secondary | ICD-10-CM | POA: Diagnosis not present

## 2023-07-27 DIAGNOSIS — H31003 Unspecified chorioretinal scars, bilateral: Secondary | ICD-10-CM | POA: Diagnosis not present

## 2023-07-27 DIAGNOSIS — H2513 Age-related nuclear cataract, bilateral: Secondary | ICD-10-CM | POA: Diagnosis not present

## 2023-07-31 ENCOUNTER — Ambulatory Visit (INDEPENDENT_AMBULATORY_CARE_PROVIDER_SITE_OTHER): Payer: Medicare Other | Admitting: Family Medicine

## 2023-07-31 VITALS — BP 124/60 | HR 95 | Temp 97.9°F | Ht 69.0 in | Wt 212.1 lb

## 2023-07-31 DIAGNOSIS — N1831 Chronic kidney disease, stage 3a: Secondary | ICD-10-CM | POA: Diagnosis not present

## 2023-07-31 DIAGNOSIS — Z7985 Long-term (current) use of injectable non-insulin antidiabetic drugs: Secondary | ICD-10-CM

## 2023-07-31 DIAGNOSIS — I10 Essential (primary) hypertension: Secondary | ICD-10-CM

## 2023-07-31 DIAGNOSIS — E785 Hyperlipidemia, unspecified: Secondary | ICD-10-CM | POA: Diagnosis not present

## 2023-07-31 DIAGNOSIS — E1165 Type 2 diabetes mellitus with hyperglycemia: Secondary | ICD-10-CM

## 2023-07-31 DIAGNOSIS — Z7984 Long term (current) use of oral hypoglycemic drugs: Secondary | ICD-10-CM | POA: Diagnosis not present

## 2023-07-31 LAB — POCT GLYCOSYLATED HEMOGLOBIN (HGB A1C): Hemoglobin A1C: 7.2 % — AB (ref 4.0–5.6)

## 2023-07-31 NOTE — Progress Notes (Signed)
Established Patient Office Visit  Subjective   Patient ID: Richard Dalton, male    DOB: Oct 26, 1950  Age: 72 y.o. MRN: [id number]  Chief Complaint  Patient presents with   Medical Management of Chronic Issues    HPI   [name] seen for medical follow-up.  He has history of hypertension, hyperlipidemia, type 2 diabetes, chronic kidney disease.  Followed regularly by nephrology.  GFR around 45-47.  His wife just had total knee replacement he has been busy helping care for her.  They hope to visit [place] next year.  He remains on atorvastatin 20 mg daily for hyperlipidemia.  Lipids were checked back in January.  He remains on several medications for diabetes including Jardiance, metformin, Ozempic, and Actos.Marland Kitchen  His blood pressure is managed with lisinopril HCTZ.  No recent dizziness.  Denies any chest pains.  Flu vaccine already given.  He is followed by podiatrist regularly and just had recent eye exam earlier this week.  Past Medical History:  Diagnosis Date   ALLERGIC RHINITIS 11/12/2006   Allergy    Anemia    Asthma 2009   patient denies this dx   Chronic kidney disease    COPD (chronic obstructive pulmonary disease) (HCC)    DIABETES MELLITUS, TYPE II 08/07/2006   ECZEMA 11/08/2008   ERECTILE DYSFUNCTION 11/12/2006   GENERALIZED VACCINIA AS COMP MEDICAL CARE NEC 09/09/2010   GERD (gastroesophageal reflux disease)    Heart murmur    HYPERLIPIDEMIA 08/07/2006   HYPERTENSION 08/07/2006   Neoplasm of uncertain behavior of skin 02/07/2009   OSTEOARTHRITIS 08/07/2006   hands   PLANTAR FASCIITIS 11/12/2006   Sleep apnea    Past Surgical History:  Procedure Laterality Date   COLONOSCOPY  08/2015   Perry-polyps   KNEE ARTHROSCOPY     right   TOE SURGERY Right 09/22/2022   TOENAIL EXCISION  10/2019   TONSILLECTOMY AND ADENOIDECTOMY  1955   WISDOM TOOTH EXTRACTION      reports that he quit smoking about 36 years ago. His smoking use included cigarettes. He started smoking  about 56 years ago. He has a 40 pack-year smoking history. He has never used smokeless tobacco. He reports current alcohol use of about 2.0 standard drinks of alcohol per week. He reports that he does not use drugs. family history includes Alzheimer's disease in his father; Diabetes in his brother, daughter, mother, and sister. Allergies  Allergen Reactions   Tetracycline     REACTION: rash    Review of Systems  Constitutional:  Negative for malaise/fatigue.  Eyes:  Negative for blurred vision.  Respiratory:  Negative for shortness of breath.   Cardiovascular:  Negative for chest pain.  Neurological:  Negative for dizziness, weakness and headaches.      Objective:     BP 124/60 (BP Location: Left Arm, Patient Position: Sitting, Cuff Size: Normal)   Pulse 95   Temp 97.9 F (36.6 C) (Oral)   Ht 5\' 9"  (1.753 m)   Wt 212 lb 1.6 oz (96.2 kg)   SpO2 99%   BMI 31.32 kg/m  BP Readings from Last 3 Encounters:  07/31/23 124/60  04/29/23 110/60  03/27/23 120/60   Wt Readings from Last 3 Encounters:  07/31/23 212 lb 1.6 oz (96.2 kg)  04/29/23 208 lb (94.3 kg)  03/27/23 209 lb 11.2 oz (95.1 kg)      Physical Exam Vitals reviewed.  Constitutional:      General: He is not in acute distress.    Appearance:  He is well-developed. He is not ill-appearing.  Eyes:     Pupils: Pupils are equal, round, and reactive to light.  Neck:     Thyroid: No thyromegaly.  Cardiovascular:     Rate and Rhythm: Normal rate and regular rhythm.  Pulmonary:     Effort: Pulmonary effort is normal. No respiratory distress.     Breath sounds: Normal breath sounds. No wheezing or rales.  Musculoskeletal:     Cervical back: Neck supple.  Neurological:     General: No focal deficit present.     Mental Status: He is alert.      Results for orders placed or performed in visit on 07/31/23  POC HgB A1c  Result Value Ref Range   Hemoglobin A1C 7.2 (A) 4.0 - 5.6 %   HbA1c POC (<> result, manual  entry)     HbA1c, POC (prediabetic range)     HbA1c, POC (controlled diabetic range)      Last CBC Lab Results  Component Value Date   WBC 5.6 11/20/2017   HGB 15.2 11/20/2017   HCT 45.8 11/20/2017   MCV 94.2 11/20/2017   RDW 14.2 11/20/2017   PLT 231.0 11/20/2017   Last metabolic panel Lab Results  Component Value Date   GLUCOSE 132 (H) 11/13/2020   NA 139 11/13/2020   K 4.2 11/13/2020   CL 99 11/13/2020   CO2 35 (H) 11/13/2020   BUN 24 (H) 11/13/2020   CREATININE 1.52 (H) 11/13/2020   GFR 45.97 (L) 11/13/2020   CALCIUM 9.4 11/13/2020   PROT 6.9 10/31/2022   ALBUMIN 4.0 10/31/2022   BILITOT 0.6 10/31/2022   ALKPHOS 88 10/31/2022   AST 19 10/31/2022   ALT 19 10/31/2022   Last lipids Lab Results  Component Value Date   CHOL 148 10/31/2022   HDL 54.40 10/31/2022   LDLCALC 73 10/31/2022   LDLDIRECT 80.0 11/15/2019   TRIG 104.0 10/31/2022   CHOLHDL 3 10/31/2022   Last hemoglobin A1c Lab Results  Component Value Date   HGBA1C 7.2 (A) 07/31/2023      The 10-year ASCVD risk score (Arnett DK, et al., 2019) is: 36.3%    Assessment & Plan:   #1 type 2 diabetes stable with A1c 7.2%.  Continue current regimen as above.  Continue yearly diabetic eye exam.  He had urine microalbumin screen within the past year per nephrology.  Set up 78-month follow-up  #2 hypertension stable and at goal.  Continue lisinopril HCTZ.  Continue low-sodium diet.  #3 hyperlipidemia treated with atorvastatin 20 mg daily.  Recheck lipids at 21-month follow-up.   Return in about 3 months (around 10/31/2023).    Evelena Peat, MD

## 2023-08-01 ENCOUNTER — Encounter: Payer: Self-pay | Admitting: Family Medicine

## 2023-08-03 ENCOUNTER — Encounter: Payer: Self-pay | Admitting: Family Medicine

## 2023-08-07 DIAGNOSIS — E119 Type 2 diabetes mellitus without complications: Secondary | ICD-10-CM | POA: Diagnosis not present

## 2023-08-12 ENCOUNTER — Encounter: Payer: Self-pay | Admitting: Family Medicine

## 2023-08-13 DIAGNOSIS — K080 Exfoliation of teeth due to systemic causes: Secondary | ICD-10-CM | POA: Diagnosis not present

## 2023-08-31 ENCOUNTER — Telehealth: Payer: Self-pay

## 2023-08-31 NOTE — Progress Notes (Signed)
   08/31/2023  Patient ID: Richard Dalton, male   DOB: 1951-02-08, 72 y.o.   MRN: [id number]  Received fax notification from [facility] regarding Jardiance assistance for 2025. Company is requiring that proof of annual income be submitted to continuing processing the application.  Notified patient, states he will turn that into the company and expressed understanding.  Sherrill Raring, PharmD Clinical Pharmacist [phone]

## 2023-09-06 DIAGNOSIS — E119 Type 2 diabetes mellitus without complications: Secondary | ICD-10-CM | POA: Diagnosis not present

## 2023-09-07 ENCOUNTER — Other Ambulatory Visit: Payer: Self-pay | Admitting: Family Medicine

## 2023-09-19 ENCOUNTER — Encounter (INDEPENDENT_AMBULATORY_CARE_PROVIDER_SITE_OTHER): Payer: Self-pay

## 2023-10-02 ENCOUNTER — Other Ambulatory Visit: Payer: Self-pay | Admitting: Family Medicine

## 2023-10-02 ENCOUNTER — Encounter: Payer: Self-pay | Admitting: Family Medicine

## 2023-10-02 DIAGNOSIS — I10 Essential (primary) hypertension: Secondary | ICD-10-CM

## 2023-10-07 DIAGNOSIS — E119 Type 2 diabetes mellitus without complications: Secondary | ICD-10-CM | POA: Diagnosis not present

## 2023-10-18 ENCOUNTER — Ambulatory Visit
Admission: RE | Admit: 2023-10-18 | Discharge: 2023-10-18 | Disposition: A | Discharge status: Home / Self Care | Payer: Medicare Other | Source: Ambulatory Visit | Attending: Family Medicine | Admitting: Family Medicine

## 2023-10-18 ENCOUNTER — Ambulatory Visit (INDEPENDENT_AMBULATORY_CARE_PROVIDER_SITE_OTHER): Payer: Medicare Other

## 2023-10-18 VITALS — BP 125/77 | HR 118 | Temp 97.7°F | Resp 18

## 2023-10-18 DIAGNOSIS — I700 Atherosclerosis of aorta: Secondary | ICD-10-CM | POA: Diagnosis not present

## 2023-10-18 DIAGNOSIS — R051 Acute cough: Secondary | ICD-10-CM | POA: Diagnosis not present

## 2023-10-18 DIAGNOSIS — R059 Cough, unspecified: Secondary | ICD-10-CM | POA: Diagnosis not present

## 2023-10-18 DIAGNOSIS — J441 Chronic obstructive pulmonary disease with (acute) exacerbation: Secondary | ICD-10-CM

## 2023-10-18 DIAGNOSIS — R509 Fever, unspecified: Secondary | ICD-10-CM | POA: Diagnosis not present

## 2023-10-18 MED ORDER — AMOXICILLIN-POT CLAVULANATE 875-125 MG PO TABS: 1.0000 | ORAL_TABLET | Freq: Two times a day (BID) | ORAL | 0 refills | Status: DC

## 2023-10-18 MED ORDER — PROMETHAZINE-DM 6.25-15 MG/5ML PO SYRP: 5.0000 mL | ORAL_SOLUTION | Freq: Three times a day (TID) | ORAL | 0 refills | Status: DC | PRN

## 2023-10-18 MED ORDER — PREDNISONE 20 MG PO TABS: 40.0000 mg | ORAL_TABLET | Freq: Every day | ORAL | 0 refills | Status: AC

## 2023-10-18 MED ORDER — ALBUTEROL SULFATE HFA 108 (90 BASE) MCG/ACT IN AERS: 1.0000 | INHALATION_SPRAY | Freq: Four times a day (QID) | RESPIRATORY_TRACT | 0 refills | Status: DC | PRN

## 2023-10-18 NOTE — Discharge Instructions (Addendum)
 Start Augmentin  twice daily for 7 days.  You may take prednisone  daily for 5 days.  This can raise your blood sugar levels.  Please monitor your sugar levels closely while on this medication.  I sent you albuterol  inhaler as needed for shortness of breath or wheezing.  Please continue your Symbicort  steroid inhaler that has already been prescribed to you by your PCP.  You may take Promethazine  DM as needed for cough.  Please note this medication can make you drowsy.  Do not drink alcohol or drive on this medication.  Lots of rest and fluids.  Please follow-up with your PCP in 2 days for recheck.  Please go to the ER if you develop any worsening symptoms.  I hope you feel better soon!

## 2023-10-18 NOTE — ED Provider Notes (Signed)
 [facility]    CSN: [id number] Arrival date & time: 10/18/23  9165      History   Chief Complaint Chief Complaint  Patient presents with   Nasal Congestion    Coughing, excessive nasal drainage, low grade fever. - Entered by patient    HPI Richard Dalton is a 73 y.o. male  presents for evaluation of URI symptoms for 7 days. Patient reports associated symptoms of productive cough with congestion/nasal drainage, chest pain with coughing, fevers of 101. Denies N/V/D, sore throat, ear pain, body aches, shortness of breath. Patient does not have a hx of asthma. Patient is a previous smoker.  Denies history of COPD but does have an albuterol  inhaler for which patient is not clear as to why he has it.  Chart review shows COPD listed in history.  Reports contacts via wife and daughter.  Pt has taken time and on Robitussin OTC for symptoms. Pt has no other concerns at this time.   HPI  Past Medical History:  Diagnosis Date   ALLERGIC RHINITIS 11/12/2006   Allergy    Anemia    Asthma 2009   patient denies this dx   Chronic kidney disease    COPD (chronic obstructive pulmonary disease) (HCC)    DIABETES MELLITUS, TYPE II 08/07/2006   ECZEMA 11/08/2008   ERECTILE DYSFUNCTION 11/12/2006   GENERALIZED VACCINIA AS COMP MEDICAL CARE NEC 09/09/2010   GERD (gastroesophageal reflux disease)    Heart murmur    HYPERLIPIDEMIA 08/07/2006   HYPERTENSION 08/07/2006   Neoplasm of uncertain behavior of skin 02/07/2009   OSTEOARTHRITIS 08/07/2006   hands   PLANTAR FASCIITIS 11/12/2006   Sleep apnea     Patient Active Problem List   Diagnosis Date Noted   CKD (chronic kidney disease) stage 3, GFR 30-59 ml/min (HCC) 03/14/2020   Obesity (BMI 30-39.9) 09/14/2013   Vaccinia 09/09/2010   NEOPLASM OF UNCERTAIN BEHAVIOR OF SKIN 02/07/2009   ECZEMA 11/08/2008   Extrinsic asthma 05/11/2008   ERECTILE DYSFUNCTION 11/12/2006   Allergic rhinitis 11/12/2006   Type 2 diabetes mellitus with  hyperglycemia (HCC) 08/07/2006   Hyperlipidemia 08/07/2006   Essential hypertension 08/07/2006   Osteoarthritis 08/07/2006    Past Surgical History:  Procedure Laterality Date   COLONOSCOPY  08/2015   Perry-polyps   KNEE ARTHROSCOPY     right   TOE SURGERY Right 09/22/2022   TOENAIL EXCISION  10/2019   TONSILLECTOMY AND ADENOIDECTOMY  1955   WISDOM TOOTH EXTRACTION         Home Medications    Prior to Admission medications   Medication Sig Start Date End Date Taking? Authorizing Provider  albuterol  (VENTOLIN  HFA) 108 (90 Base) MCG/ACT inhaler Inhale 1-2 puffs into the lungs every 6 (six) hours as needed for wheezing or shortness of breath. 10/18/23  Yes Alivya Wegman, Jodi R, NP  amoxicillin -clavulanate (AUGMENTIN ) 875-125 MG tablet Take 1 tablet by mouth every 12 (twelve) hours. 10/18/23  Yes Nashaun Hillmer, Jodi R, NP  predniSONE  (DELTASONE ) 20 MG tablet Take 2 tablets (40 mg total) by mouth daily with breakfast for 5 days. 10/18/23 10/23/23 Yes Rayanne Padmanabhan, Jodi R, NP  promethazine -dextromethorphan (PROMETHAZINE -DM) 6.25-15 MG/5ML syrup Take 5 mLs by mouth 3 (three) times daily as needed for cough. 10/18/23  Yes Preethi Scantlebury, Jodi R, NP  atorvastatin  (LIPITOR) 20 MG tablet TAKE 1 TABLET(20 MG) BY MOUTH DAILY 10/05/23   Burchette, Wolm ORN, MD  budesonide -formoterol  (SYMBICORT ) 160-4.5 MCG/ACT inhaler INHALE 2 PUFFS INTO THE LUNGS TWICE DAILY 09/07/23  Burchette, Wolm ORN, MD  chlorpheniramine (CHLOR-TRIMETON) 4 MG tablet Take 4 mg by mouth every 8 (eight) hours.    [provider]  fluticasone (FLONASE) 50 MCG/ACT nasal spray Place into both nostrils daily.    [provider]  JARDIANCE  25 MG TABS tablet TAKE 1 TABLET BY MOUTH EVERY DAY BEFORE BREAKFAST 10/05/23   Burchette, Wolm ORN, MD  levocetirizine (XYZAL) 5 MG tablet Take 5 mg by mouth every evening.    [provider]  lisinopril -hydrochlorothiazide  (ZESTORETIC ) 10-12.5 MG tablet TAKE 1 TABLET BY MOUTH EVERY DAY 10/05/23    Burchette, Wolm ORN, MD  metFORMIN  (GLUCOPHAGE ) 1000 MG tablet Take 1 tablet (1,000 mg total) by mouth daily with breakfast. 10/29/22   Burchette, Wolm ORN, MD  montelukast  (SINGULAIR ) 10 MG tablet TAKE 1 TABLET BY MOUTH AT BEDTIME 10/05/23   Burchette, Wolm ORN, MD  Multiple Vitamin (MULTIVITAMIN PO) Take by mouth.    [provider]  pioglitazone  (ACTOS ) 15 MG tablet TAKE 1 TABLET(15 MG) BY MOUTH DAILY 10/05/23   Burchette, Wolm ORN, MD  Semaglutide , 1 MG/DOSE, (OZEMPIC , 1 MG/DOSE,) 2 MG/1.5ML SOPN Inject 1 mg into the skin once a week. 04/29/23   Burchette, Wolm ORN, MD    Family History Family History  Problem Relation Age of Onset   Diabetes Mother    Alzheimer's disease Father        died age 56   Diabetes Sister        4 sisters with diabetes   Diabetes Brother    Diabetes Daughter    Colon cancer Neg Hx    Esophageal cancer Neg Hx    Stomach cancer Neg Hx    Rectal cancer Neg Hx     Social History Social History   Tobacco Use   Smoking status: Former    Current packs/day: 0.00    Average packs/day: 2.0 packs/day for 20.0 years (40.0 ttl pk-yrs)    Types: Cigarettes    Start date: 03/10/1967    Quit date: 03/10/1987    Years since quitting: 36.6   Smokeless tobacco: Never  Vaping Use   Vaping status: Never Used  Substance Use Topics   Alcohol use: Yes    Alcohol/week: 2.0 standard drinks of alcohol    Types: 2 Standard drinks or equivalent per week    Comment: weekly   Drug use: No     Allergies   Tetracycline   Review of Systems Review of Systems  Constitutional:  Positive for fever.  HENT:  Positive for congestion.   Respiratory:  Positive for cough.      Physical Exam Triage Vital Signs ED Triage Vitals  Encounter Vitals Group     BP 10/18/23 0843 125/77     Systolic BP Percentile --      Diastolic BP Percentile --      Pulse Rate 10/18/23 0843 (!) 118     Resp 10/18/23 0843 18     Temp 10/18/23 0843 97.7 F (36.5 C)     Temp Source  10/18/23 0843 Oral     SpO2 10/18/23 0843 91 %     Weight --      Height --      Head Circumference --      Peak Flow --      Pain Score 10/18/23 0839 0     Pain Loc --      Pain Education --      Exclude from Growth Chart --  No data found.  Updated Vital Signs BP 125/77 (BP Location: Right Arm)   Pulse (!) 118   Temp 97.7 F (36.5 C) (Oral)   Resp 18   SpO2 91%   Visual Acuity Right Eye Distance:   Left Eye Distance:   Bilateral Distance:    Right Eye Near:   Left Eye Near:    Bilateral Near:     Physical Exam Vitals and nursing note reviewed.  Constitutional:      General: He is not in acute distress.    Appearance: Normal appearance. He is not ill-appearing or toxic-appearing.  HENT:     Head: Normocephalic and atraumatic.     Right Ear: Tympanic membrane and ear canal normal.     Left Ear: Tympanic membrane and ear canal normal.     Nose: Congestion present.     Mouth/Throat:     Mouth: Mucous membranes are moist.     Pharynx: No oropharyngeal exudate or posterior oropharyngeal erythema.  Eyes:     Pupils: Pupils are equal, round, and reactive to light.  Cardiovascular:     Rate and Rhythm: Regular rhythm. Tachycardia present.     Heart sounds: Normal heart sounds.  Pulmonary:     Effort: Pulmonary effort is normal.     Breath sounds: Normal breath sounds. No wheezing or rhonchi.  Musculoskeletal:     Cervical back: Normal range of motion and neck supple.  Lymphadenopathy:     Cervical: No cervical adenopathy.  Skin:    General: Skin is warm and dry.  Neurological:     General: No focal deficit present.     Mental Status: He is alert and oriented to person, place, and time.  Psychiatric:        Mood and Affect: Mood normal.        Behavior: Behavior normal.      UC Treatments / Results  Labs (all labs ordered are listed, but only abnormal results are displayed) Labs Reviewed - No data to display  EKG   Radiology DG Chest 2  View Result Date: 10/18/2023 CLINICAL DATA:  Cough and fever for 1 week. EXAM: CHEST - 2 VIEW COMPARISON:  02/04/2014 FINDINGS: The heart size and mediastinal contours are within normal limits. Aortic atherosclerotic calcification incidentally noted. Both lungs are clear. The visualized skeletal structures are unremarkable. IMPRESSION: No active cardiopulmonary disease. Electronically Signed   By: Norleen DELENA Kil M.D.   On: 10/18/2023 09:19    Procedures Procedures (including critical care time)  Medications Ordered in UC Medications - No data to display  Initial Impression / Assessment and Plan / UC Course  I have reviewed the triage vital signs and the nursing notes.  Pertinent labs & imaging results that were available during my care of the patient were reviewed by me and considered in my medical decision making (see chart for details).     Reviewed exam and symptoms with patient.  Chest x-ray negative for pneumonia.  Discussed COPD exacerbation.  Will start Augmentin , prednisone , albuterol  inhaler, and Promethazine  DM.  Side effect profile reviewed.  He is to continue his Symbicort  inhaler as previously prescribed.  Advised PCP follow-up 2 days for recheck.  Strict ER precautions reviewed and patient verbalized understanding.  Final diagnoses:  Acute cough  COPD exacerbation [facility])     Discharge Instructions      Start Augmentin  twice daily for 7 days.  You may take prednisone  daily for 5 days.  This can raise your blood  sugar levels.  Please monitor your sugar levels closely while on this medication.  I sent you albuterol  inhaler as needed for shortness of breath or wheezing.  Please continue your Symbicort  steroid inhaler that has already been prescribed to you by your PCP.  You may take Promethazine  DM as needed for cough.  Please note this medication can make you drowsy.  Do not drink alcohol or drive on this medication.  Lots of rest and fluids.  Please follow-up with your PCP in 2  days for recheck.  Please go to the ER if you develop any worsening symptoms.  I hope you feel better soon!     ED Prescriptions     Medication Sig Dispense Auth. Provider   amoxicillin -clavulanate (AUGMENTIN ) 875-125 MG tablet Take 1 tablet by mouth every 12 (twelve) hours. 14 tablet Lylee Corrow, Jodi R, NP   predniSONE  (DELTASONE ) 20 MG tablet Take 2 tablets (40 mg total) by mouth daily with breakfast for 5 days. 10 tablet Felix Meras, Jodi R, NP   albuterol  (VENTOLIN  HFA) 108 (90 Base) MCG/ACT inhaler Inhale 1-2 puffs into the lungs every 6 (six) hours as needed for wheezing or shortness of breath. 1 each Jeannia Tatro, Jodi R, NP   promethazine -dextromethorphan (PROMETHAZINE -DM) 6.25-15 MG/5ML syrup Take 5 mLs by mouth 3 (three) times daily as needed for cough. 118 mL Tilla Wilborn, Jodi R, NP      PDMP not reviewed this encounter.   Loreda Myla SAUNDERS, NP 10/18/23 [phone]

## 2023-10-18 NOTE — ED Triage Notes (Signed)
 Pt presents to UC for c/o nasal drainage, productive cough, fever, pain in chest when coughing x 1 week. Has tried robitussin DM, tylenol

## 2023-10-29 ENCOUNTER — Other Ambulatory Visit (HOSPITAL_COMMUNITY): Payer: Self-pay

## 2023-10-30 ENCOUNTER — Ambulatory Visit (INDEPENDENT_AMBULATORY_CARE_PROVIDER_SITE_OTHER): Payer: Medicare Other | Admitting: Family Medicine

## 2023-10-30 ENCOUNTER — Encounter: Payer: Self-pay | Admitting: Family Medicine

## 2023-10-30 VITALS — BP 120/60 | HR 103 | Temp 98.1°F | Wt 210.0 lb

## 2023-10-30 DIAGNOSIS — E785 Hyperlipidemia, unspecified: Secondary | ICD-10-CM

## 2023-10-30 DIAGNOSIS — E1165 Type 2 diabetes mellitus with hyperglycemia: Secondary | ICD-10-CM

## 2023-10-30 DIAGNOSIS — N1831 Chronic kidney disease, stage 3a: Secondary | ICD-10-CM | POA: Diagnosis not present

## 2023-10-30 DIAGNOSIS — L989 Disorder of the skin and subcutaneous tissue, unspecified: Secondary | ICD-10-CM

## 2023-10-30 DIAGNOSIS — I10 Essential (primary) hypertension: Secondary | ICD-10-CM

## 2023-10-30 LAB — BASIC METABOLIC PANEL
BUN: 26 mg/dL — ABNORMAL HIGH (ref 6–23)
CO2: 30 meq/L (ref 19–32)
Calcium: 8.8 mg/dL (ref 8.4–10.5)
Chloride: 101 meq/L (ref 96–112)
Creatinine, Ser: 1.52 mg/dL — ABNORMAL HIGH (ref 0.40–1.50)
GFR: 45.02 mL/min — ABNORMAL LOW (ref >60.00)
Glucose, Bld: 170 mg/dL — ABNORMAL HIGH (ref 70–99)
Potassium: 4 meq/L (ref 3.5–5.1)
Sodium: 139 meq/L (ref 135–145)

## 2023-10-30 LAB — LIPID PANEL
Cholesterol: 143 mg/dL (ref 0–200)
HDL: 46.6 mg/dL (ref >39.00)
LDL Cholesterol: 60 mg/dL (ref 0–99)
NonHDL: 96
Total CHOL/HDL Ratio: 3
Triglycerides: 178 mg/dL — ABNORMAL HIGH (ref 0.0–149.0)
VLDL: 35.6 mg/dL (ref 0.0–40.0)

## 2023-10-30 LAB — HEPATIC FUNCTION PANEL
ALT: 21 U/L (ref 0–53)
AST: 20 U/L (ref 0–37)
Albumin: 3.8 g/dL (ref 3.5–5.2)
Alkaline Phosphatase: 82 U/L (ref 39–117)
Bilirubin, Direct: 0.1 mg/dL (ref 0.0–0.3)
Total Bilirubin: 0.7 mg/dL (ref 0.2–1.2)
Total Protein: 6.6 g/dL (ref 6.0–8.3)

## 2023-10-30 LAB — MICROALBUMIN / CREATININE URINE RATIO
Creatinine,U: 65.3 mg/dL
Microalb Creat Ratio: 2.1 mg/g (ref 0.0–30.0)
Microalb, Ur: 1.4 mg/dL (ref 0.0–1.9)

## 2023-10-30 LAB — HEMOGLOBIN A1C: Hgb A1c MFr Bld: 8.5 % — ABNORMAL HIGH (ref 4.6–6.5)

## 2023-10-30 NOTE — Progress Notes (Signed)
Established Patient Office Visit  Subjective   Patient ID: Richard Dalton, male    DOB: [birth year]  Age: 73 y.o. MRN: [id number]  Chief Complaint  Patient presents with   Medical Management of Chronic Issues    HPI   Richard Dalton is seen for medical follow-up.  Richard Dalton does have acute concern of scaly rapidly growing lesion mid parietal scalp.  Denies any injury to this region.  Richard Dalton first noticed this couple months ago.  His chronic problems include hypertension, type 2 diabetes, chronic kidney disease stage III, hyperlipidemia.  Medications reviewed.  Compliant with all.  His diabetes has been relatively stable.  Last A1c 7.2%.  Richard Dalton does get regular eye exams.  Due for follow-up labs.  Richard Dalton takes atorvastatin for hyperlipidemia.  No significant myalgias.  His diabetes is treated with Actos, Ozempic 1 mg subcutaneous per week, metformin, and Jardiance  Past Medical History:  Diagnosis Date   ALLERGIC RHINITIS 11/12/2006   Allergy    Anemia    Asthma 2009   patient denies this dx   Chronic kidney disease    COPD (chronic obstructive pulmonary disease) (HCC)    DIABETES MELLITUS, TYPE II 08/07/2006   ECZEMA 11/08/2008   ERECTILE DYSFUNCTION 11/12/2006   GENERALIZED VACCINIA AS COMP MEDICAL CARE NEC 09/09/2010   GERD (gastroesophageal reflux disease)    Heart murmur    HYPERLIPIDEMIA 08/07/2006   HYPERTENSION 08/07/2006   Neoplasm of uncertain behavior of skin 02/07/2009   OSTEOARTHRITIS 08/07/2006   hands   PLANTAR FASCIITIS 11/12/2006   Sleep apnea    Past Surgical History:  Procedure Laterality Date   COLONOSCOPY  08/2015   Perry-polyps   KNEE ARTHROSCOPY     right   TOE SURGERY Right 09/22/2022   TOENAIL EXCISION  10/2019   TONSILLECTOMY AND ADENOIDECTOMY  1955   WISDOM TOOTH EXTRACTION      reports that Richard Dalton quit smoking about 36 years ago. His smoking use included cigarettes. Richard Dalton started smoking about 56 years ago. Richard Dalton has a 40 pack-year smoking history. Richard Dalton has never used smokeless  tobacco. Richard Dalton reports current alcohol use of about 2.0 standard drinks of alcohol per week. Richard Dalton reports that Richard Dalton does not use drugs. family history includes Alzheimer's disease in his father; Diabetes in his brother, daughter, mother, and sister. Allergies  Allergen Reactions   Tetracycline     REACTION: rash    Review of Systems  Constitutional:  Negative for malaise/fatigue.  Eyes:  Negative for blurred vision.  Respiratory:  Negative for shortness of breath.   Cardiovascular:  Negative for chest pain.  Neurological:  Negative for dizziness, weakness and headaches.      Objective:     BP 120/60 (BP Location: Left Arm, Patient Position: Sitting, Cuff Size: Normal)   Pulse (!) 103   Temp 98.1 F (36.7 C) (Oral)   Wt 210 lb (95.3 kg)   SpO2 98%   BMI 31.01 kg/m  BP Readings from Last 3 Encounters:  10/30/23 120/60  10/18/23 125/77  07/31/23 124/60   Wt Readings from Last 3 Encounters:  10/30/23 210 lb (95.3 kg)  07/31/23 212 lb 1.6 oz (96.2 kg)  04/29/23 208 lb (94.3 kg)      Physical Exam Vitals reviewed.  Constitutional:      General: Richard Dalton is not in acute distress.    Appearance: Richard Dalton is well-developed.  Eyes:     Pupils: Pupils are equal, round, and reactive to light.  Neck:     Thyroid:  No thyromegaly.  Cardiovascular:     Rate and Rhythm: Normal rate and regular rhythm.  Pulmonary:     Effort: Pulmonary effort is normal. No respiratory distress.     Breath sounds: Normal breath sounds. No wheezing or rales.  Musculoskeletal:     Cervical back: Neck supple.  Skin:    Comments: Mid parietal scalp reveals proximately one half and 1/2 cm elevated scaly skin lesion.  Slightly tender to palpation.  Neurological:     Mental Status: Richard Dalton is alert and oriented to person, place, and time.      No results found for any visits on 10/30/23.  Last CBC Lab Results  Component Value Date   WBC 5.6 11/20/2017   HGB 16.0 12/15/2022   HCT 45.8 11/20/2017   MCV 94.2  11/20/2017   RDW 14.2 11/20/2017   PLT 231.0 11/20/2017   Last metabolic panel Lab Results  Component Value Date   GLUCOSE 132 (H) 11/13/2020   NA 141 12/15/2022   K 4.2 12/15/2022   CL 99 12/15/2022   CO2 24 (A) 12/15/2022   BUN 21 12/15/2022   CREATININE 1.4 (A) 12/15/2022   EGFR 52 12/15/2022   CALCIUM 9.6 12/15/2022   PROT 6.9 10/31/2022   ALBUMIN 4.2 12/15/2022   BILITOT 0.6 10/31/2022   ALKPHOS 88 10/31/2022   AST 19 10/31/2022   ALT 19 10/31/2022   Last lipids Lab Results  Component Value Date   CHOL 148 10/31/2022   HDL 54.40 10/31/2022   LDLCALC 73 10/31/2022   LDLDIRECT 80.0 11/15/2019   TRIG 104.0 10/31/2022   CHOLHDL 3 10/31/2022   Last hemoglobin A1c Lab Results  Component Value Date   HGBA1C 7.2 (A) 07/31/2023      The 10-year ASCVD risk score (Arnett DK, et al., 2019) is: 34.6%    Assessment & Plan:   Problem List Items Addressed This Visit       Unprioritized   CKD (chronic kidney disease) stage 3, GFR 30-59 ml/min (HCC)   Relevant Orders   Basic metabolic panel   Type 2 diabetes mellitus with hyperglycemia (HCC)   Relevant Orders   Hemoglobin A1c   Microalbumin / creatinine urine ratio   Hyperlipidemia   Relevant Orders   Lipid panel   Hepatic function panel   Essential hypertension - Primary   Relevant Orders   Basic metabolic panel   Other Visit Diagnoses       Skin lesion       Relevant Orders   Ambulatory referral to Dermatology     Probable squamous cell carcinoma versus irritated actinic keratosis parietal scalp.  Set up dermatology referral.  Will likely need skin biopsy  -Obtain follow-up labs as above  -Continue yearly diabetic eye exam  -Set up routine follow-up in 3 months unless indicated otherwise by labs above  Return in about 3 months (around 01/28/2024).    Evelena Peat, MD

## 2023-11-02 ENCOUNTER — Other Ambulatory Visit (HOSPITAL_COMMUNITY): Payer: Self-pay

## 2023-11-02 MED ORDER — OZEMPIC (2 MG/DOSE) 8 MG/3ML ~~LOC~~ SOPN: PEN_INJECTOR | SUBCUTANEOUS | 2 refills | Status: DC

## 2023-11-02 NOTE — Addendum Note (Signed)
Addended by: Christy Sartorius on: 11/02/2023 08:23 AM   Modules accepted: Orders

## 2023-11-02 NOTE — Addendum Note (Signed)
Addended by: Christy Sartorius on: 11/02/2023 08:15 AM   Modules accepted: Orders

## 2023-11-07 DIAGNOSIS — E119 Type 2 diabetes mellitus without complications: Secondary | ICD-10-CM | POA: Diagnosis not present

## 2023-11-11 ENCOUNTER — Ambulatory Visit: Payer: Medicare Other | Admitting: Podiatry

## 2023-11-11 ENCOUNTER — Encounter: Payer: Self-pay | Admitting: Family Medicine

## 2023-11-11 ENCOUNTER — Encounter: Payer: Self-pay | Admitting: Podiatry

## 2023-11-11 DIAGNOSIS — M79674 Pain in right toe(s): Secondary | ICD-10-CM

## 2023-11-11 DIAGNOSIS — M79675 Pain in left toe(s): Secondary | ICD-10-CM

## 2023-11-11 DIAGNOSIS — B351 Tinea unguium: Secondary | ICD-10-CM

## 2023-11-11 NOTE — Progress Notes (Signed)
  Subjective:  Patient ID: Richard Dalton, male    DOB: [birth year],  MRN: [id number]  Chief Complaint  Patient presents with   RFC    RM#11 RFC patient states no other concerns at time.   73 y.o. male returns for the above complaint.  Patient presents with thickened elongated dystrophic mycotic toenails x 10 mild pain on palpation hurts with ambulation and likely debride is able to do it himself.  Objective:  There were no vitals filed for this visit. Podiatric Exam: Vascular: dorsalis pedis and posterior tibial pulses are palpable bilateral. Capillary return is immediate. Temperature gradient is WNL. Skin turgor WNL  Sensorium: Normal Semmes Weinstein monofilament test. Normal tactile sensation bilaterally. Nail Exam: Pt has thick disfigured discolored nails with subungual debris noted bilateral entire nail hallux through fifth toenails.  Pain on palpation to the nails. Ulcer Exam: There is no evidence of ulcer or pre-ulcerative changes or infection. Orthopedic Exam: Muscle tone and strength are WNL. No limitations in general ROM. No crepitus or effusions noted.  Skin: No Porokeratosis. No infection or ulcers    Assessment & Plan:   1. Pain due to onychomycosis of toenails of both feet     Patient was evaluated and treated and all questions answered.  Onychomycosis with pain  -Nails palliatively debrided as below. -Educated on self-care  Procedure: Nail Debridement Rationale: pain  Type of Debridement: manual, sharp debridement. Instrumentation: Nail nipper, rotary burr. Number of Nails: 10  Procedures and Treatment: Consent by patient was obtained for treatment procedures. The patient understood the discussion of treatment and procedures well. All questions were answered thoroughly reviewed. Debridement of mycotic and hypertrophic toenails, 1 through 5 bilateral and clearing of subungual debris. No ulceration, no infection noted.  Return Visit-Office Procedure: Patient  instructed to return to the office for a follow up visit 3 months for continued evaluation and treatment.  Franky Blanch, DPM    No follow-ups on file.

## 2023-11-19 DIAGNOSIS — D485 Neoplasm of uncertain behavior of skin: Secondary | ICD-10-CM | POA: Diagnosis not present

## 2023-11-19 DIAGNOSIS — L821 Other seborrheic keratosis: Secondary | ICD-10-CM | POA: Diagnosis not present

## 2023-11-19 DIAGNOSIS — C4442 Squamous cell carcinoma of skin of scalp and neck: Secondary | ICD-10-CM | POA: Diagnosis not present

## 2023-11-23 ENCOUNTER — Other Ambulatory Visit: Payer: Self-pay | Admitting: Family Medicine

## 2023-11-24 ENCOUNTER — Encounter: Payer: Self-pay | Admitting: Family Medicine

## 2023-11-24 MED ORDER — BUDESONIDE-FORMOTEROL FUMARATE 160-4.5 MCG/ACT IN AERO: 2.0000 | INHALATION_SPRAY | Freq: Two times a day (BID) | RESPIRATORY_TRACT | 2 refills | Status: DC

## 2023-12-05 DIAGNOSIS — E119 Type 2 diabetes mellitus without complications: Secondary | ICD-10-CM | POA: Diagnosis not present

## 2023-12-15 DIAGNOSIS — N1831 Chronic kidney disease, stage 3a: Secondary | ICD-10-CM | POA: Diagnosis not present

## 2023-12-23 DIAGNOSIS — I129 Hypertensive chronic kidney disease with stage 1 through stage 4 chronic kidney disease, or unspecified chronic kidney disease: Secondary | ICD-10-CM | POA: Diagnosis not present

## 2023-12-23 DIAGNOSIS — N1831 Chronic kidney disease, stage 3a: Secondary | ICD-10-CM | POA: Diagnosis not present

## 2023-12-23 DIAGNOSIS — E1122 Type 2 diabetes mellitus with diabetic chronic kidney disease: Secondary | ICD-10-CM | POA: Diagnosis not present

## 2023-12-23 DIAGNOSIS — D631 Anemia in chronic kidney disease: Secondary | ICD-10-CM | POA: Diagnosis not present

## 2023-12-23 LAB — LAB REPORT - SCANNED: A1c: 7.3

## 2023-12-28 DIAGNOSIS — C4442 Squamous cell carcinoma of skin of scalp and neck: Secondary | ICD-10-CM | POA: Diagnosis not present

## 2024-01-02 ENCOUNTER — Other Ambulatory Visit: Payer: Self-pay | Admitting: Family Medicine

## 2024-01-04 ENCOUNTER — Other Ambulatory Visit: Payer: Self-pay | Admitting: Family Medicine

## 2024-01-04 MED ORDER — BUDESONIDE-FORMOTEROL FUMARATE 160-4.5 MCG/ACT IN AERO: 2.0000 | INHALATION_SPRAY | Freq: Two times a day (BID) | RESPIRATORY_TRACT | 0 refills | Status: DC

## 2024-01-04 MED ORDER — ATORVASTATIN CALCIUM 20 MG PO TABS: ORAL_TABLET | ORAL | 0 refills | Status: DC

## 2024-01-04 MED ORDER — EMPAGLIFLOZIN 25 MG PO TABS: ORAL_TABLET | ORAL | 0 refills | Status: DC

## 2024-01-04 MED ORDER — MONTELUKAST SODIUM 10 MG PO TABS: 10.0000 mg | ORAL_TABLET | Freq: Every day | ORAL | 0 refills | Status: DC

## 2024-01-05 DIAGNOSIS — E119 Type 2 diabetes mellitus without complications: Secondary | ICD-10-CM | POA: Diagnosis not present

## 2024-01-07 ENCOUNTER — Encounter: Payer: Self-pay | Admitting: Family Medicine

## 2024-01-07 MED ORDER — METFORMIN HCL 1000 MG PO TABS: 1000.0000 mg | ORAL_TABLET | Freq: Every day | ORAL | 1 refills | Status: DC

## 2024-01-14 ENCOUNTER — Other Ambulatory Visit: Payer: Self-pay | Admitting: Family Medicine

## 2024-01-14 MED ORDER — PIOGLITAZONE HCL 15 MG PO TABS: ORAL_TABLET | ORAL | 0 refills | Status: DC

## 2024-01-27 ENCOUNTER — Ambulatory Visit (INDEPENDENT_AMBULATORY_CARE_PROVIDER_SITE_OTHER): Payer: Medicare Other | Admitting: Family Medicine

## 2024-01-27 ENCOUNTER — Encounter: Payer: Self-pay | Admitting: Family Medicine

## 2024-01-27 VITALS — BP 138/68 | HR 98 | Temp 98.4°F | Wt 213.3 lb

## 2024-01-27 DIAGNOSIS — N1831 Chronic kidney disease, stage 3a: Secondary | ICD-10-CM

## 2024-01-27 DIAGNOSIS — E785 Hyperlipidemia, unspecified: Secondary | ICD-10-CM

## 2024-01-27 DIAGNOSIS — I10 Essential (primary) hypertension: Secondary | ICD-10-CM

## 2024-01-27 DIAGNOSIS — E1165 Type 2 diabetes mellitus with hyperglycemia: Secondary | ICD-10-CM

## 2024-01-27 NOTE — Patient Instructions (Signed)
 Be in touch if BP consistently > 130/80 over next several weeks.

## 2024-01-27 NOTE — Progress Notes (Signed)
 Established Patient Office Visit  Subjective   Patient ID: Richard Dalton, male    DOB: March 29, 1951  Age: 73 y.o. MRN: [id number]  Chief Complaint  Patient presents with   Medical Management of Chronic Issues    HPI   [name] seen for medical follow-up.  He recently saw his nephrologist and had blood pressure 104/60.  Did not have any dizziness but they ended up stopping his lisinopril  HCTZ and switching to plain lisinopril  5 mg daily.  Home blood pressures have been mostly 120s to 130s systolic.  Diastolics 60s to 70s.  No headaches.  No chest pains.  Medications reviewed.  He had last A1c of 8.5% here and we had increased his Ozempic  to 2 mg/week.  He had some nausea and had a drop this back to 1.5 mg and after doing this for a few weeks went back to 2 mg and has tolerated at this point.  Has been on the 2 mg dose now for several weeks and tolerating well.  He had several labs per nephrology including A1c on 3-19 of 7.3%.  He has hyperlipidemia treated with rosuvastatin 20 mg daily.  Last LDL cholesterol 60.  He did have questions today about possible LP(a).  Past Medical History:  Diagnosis Date   ALLERGIC RHINITIS 11/12/2006   Allergy    Anemia    Asthma 2009   patient denies this dx   Chronic kidney disease    COPD (chronic obstructive pulmonary disease) (HCC)    DIABETES MELLITUS, TYPE II 08/07/2006   ECZEMA 11/08/2008   ERECTILE DYSFUNCTION 11/12/2006   GENERALIZED VACCINIA AS COMP MEDICAL CARE NEC 09/09/2010   GERD (gastroesophageal reflux disease)    Heart murmur    HYPERLIPIDEMIA 08/07/2006   HYPERTENSION 08/07/2006   Neoplasm of uncertain behavior of skin 02/07/2009   OSTEOARTHRITIS 08/07/2006   hands   PLANTAR FASCIITIS 11/12/2006   Sleep apnea    Past Surgical History:  Procedure Laterality Date   COLONOSCOPY  08/2015   Perry-polyps   KNEE ARTHROSCOPY     right   TOE SURGERY Right 09/22/2022   TOENAIL EXCISION  10/2019   TONSILLECTOMY AND ADENOIDECTOMY   1955   WISDOM TOOTH EXTRACTION      reports that he quit smoking about 36 years ago. His smoking use included cigarettes. He started smoking about 56 years ago. He has a 40 pack-year smoking history. He has never used smokeless tobacco. He reports current alcohol use of about 2.0 standard drinks of alcohol per week. He reports that he does not use drugs. family history includes Alzheimer's disease in his father; Diabetes in his brother, daughter, mother, and sister. Allergies  Allergen Reactions   Tetracycline     REACTION: rash    Review of Systems  Constitutional:  Negative for chills, fever, malaise/fatigue and weight loss.  Eyes:  Negative for blurred vision.  Respiratory:  Negative for shortness of breath.   Cardiovascular:  Negative for chest pain.  Gastrointestinal:  Negative for nausea and vomiting.  Neurological:  Negative for dizziness, weakness and headaches.      Objective:     BP 138/68 (BP Location: Left Arm, Cuff Size: Normal)   Pulse 98   Temp 98.4 F (36.9 C) (Oral)   Wt 213 lb 4.8 oz (96.8 kg)   SpO2 95%   BMI 31.50 kg/m  BP Readings from Last 3 Encounters:  01/27/24 138/68  10/30/23 120/60  10/18/23 125/77   Wt Readings from Last 3 Encounters:  01/27/24 213 lb 4.8 oz (96.8 kg)  10/30/23 210 lb (95.3 kg)  07/31/23 212 lb 1.6 oz (96.2 kg)      Physical Exam Vitals reviewed.  Constitutional:      General: He is not in acute distress.    Appearance: He is well-developed. He is not ill-appearing.  Eyes:     Pupils: Pupils are equal, round, and reactive to light.  Neck:     Thyroid: No thyromegaly.  Cardiovascular:     Rate and Rhythm: Normal rate and regular rhythm.  Pulmonary:     Effort: Pulmonary effort is normal. No respiratory distress.     Breath sounds: Normal breath sounds. No wheezing or rales.  Musculoskeletal:     Cervical back: Neck supple.  Neurological:     Mental Status: He is alert and oriented to person, place, and time.       No results found for any visits on 01/27/24.    The 10-year ASCVD risk score (Arnett DK, et al., 2019) is: 46.3%    Assessment & Plan:   #1 type 2 diabetes improving with increased dose of Ozempic .  Recent A1c per nephrology 7.3% in March.  Will not check today since this was checked just a month ago.  Would recommend 48-month follow-up and repeat A1c then.  We did discuss if not getting further to goal could change to Mounjaro which would add more potency for A1c reduction and also more weight loss.  #2 hypertension.  Recent low blood pressure reading without symptoms.  Adjustment in medications as above.  Blood pressure was up slightly today but improved by home readings.  Continue close monitoring and be in touch if consistently greater than 130/80  #3 hyperlipidemia true with atorvastatin .  We had discussion regarding LP(a) testing.  Will consider with next labs.  We did mention there are couple drugs and phase 3 trials but at this point no specific therapy for LP(a) but focus should be modifying other risk factor such as good diabetes control, hypertension control, LDL reduction, etc.   Return in about 3 months (around 04/27/2024).    Glean Lamy, MD

## 2024-02-04 DIAGNOSIS — E119 Type 2 diabetes mellitus without complications: Secondary | ICD-10-CM | POA: Diagnosis not present

## 2024-02-12 ENCOUNTER — Ambulatory Visit: Payer: Medicare Other | Admitting: Podiatry

## 2024-02-12 DIAGNOSIS — M79675 Pain in left toe(s): Secondary | ICD-10-CM | POA: Diagnosis not present

## 2024-02-12 DIAGNOSIS — B351 Tinea unguium: Secondary | ICD-10-CM | POA: Diagnosis not present

## 2024-02-12 DIAGNOSIS — M79674 Pain in right toe(s): Secondary | ICD-10-CM

## 2024-02-12 NOTE — Progress Notes (Signed)
  Subjective:  Patient ID: Richard Dalton, male    DOB: [birth year],  MRN: [id number]  Chief Complaint  Patient presents with   Nail Problem    Nail trim    73 y.o. male returns for the above complaint.  Patient presents with thickened elongated dystrophic mycotic toenails x 10 mild pain on palpation hurts with ambulation and likely debride is able to do it himself.  Objective:  There were no vitals filed for this visit. Podiatric Exam: Vascular: dorsalis pedis and posterior tibial pulses are palpable bilateral. Capillary return is immediate. Temperature gradient is WNL. Skin turgor WNL  Sensorium: Normal Semmes Weinstein monofilament test. Normal tactile sensation bilaterally. Nail Exam: Pt has thick disfigured discolored nails with subungual debris noted bilateral entire nail hallux through fifth toenails.  Pain on palpation to the nails. Ulcer Exam: There is no evidence of ulcer or pre-ulcerative changes or infection. Orthopedic Exam: Muscle tone and strength are WNL. No limitations in general ROM. No crepitus or effusions noted.  Skin: No Porokeratosis. No infection or ulcers    Assessment & Plan:   No diagnosis found.   Patient was evaluated and treated and all questions answered.  Onychomycosis with pain  -Nails palliatively debrided as below. -Educated on self-care  Procedure: Nail Debridement Rationale: pain  Type of Debridement: manual, sharp debridement. Instrumentation: Nail nipper, rotary burr. Number of Nails: 10  Procedures and Treatment: Consent by patient was obtained for treatment procedures. The patient understood the discussion of treatment and procedures well. All questions were answered thoroughly reviewed. Debridement of mycotic and hypertrophic toenails, 1 through 5 bilateral and clearing of subungual debris. No ulceration, no infection noted.  Return Visit-Office Procedure: Patient instructed to return to the office for a follow up visit 3 months for  continued evaluation and treatment.  Tinnie Forehand, DPM    No follow-ups on file.

## 2024-02-12 NOTE — Progress Notes (Signed)
   02/12/2024  Patient ID: Richard Dalton, male   DOB: 08-Feb-1951, 73 y.o.   MRN: [id number]  Pharmacy Quality Measure Review  This patient is appearing on a report for being at risk of failing the adherence measure for diabetes medications this calendar year.   Medication: Ozempic  Last fill date: 02/08/24 for 28 day supply  Insurance report was not up to date. No action needed at this time.   Carnell Christian, PharmD Clinical Pharmacist [phone]

## 2024-02-23 DIAGNOSIS — K080 Exfoliation of teeth due to systemic causes: Secondary | ICD-10-CM | POA: Diagnosis not present

## 2024-03-06 DIAGNOSIS — E119 Type 2 diabetes mellitus without complications: Secondary | ICD-10-CM | POA: Diagnosis not present

## 2024-03-17 ENCOUNTER — Other Ambulatory Visit: Payer: Self-pay | Admitting: Family Medicine

## 2024-03-22 ENCOUNTER — Other Ambulatory Visit: Payer: Self-pay | Admitting: Family Medicine

## 2024-03-28 ENCOUNTER — Ambulatory Visit (INDEPENDENT_AMBULATORY_CARE_PROVIDER_SITE_OTHER): Payer: Medicare Other

## 2024-03-28 VITALS — BP 120/60 | HR 87 | Temp 98.1°F | Ht 69.0 in | Wt 208.1 lb

## 2024-03-28 DIAGNOSIS — Z0000 Encounter for general adult medical examination without abnormal findings: Secondary | ICD-10-CM | POA: Diagnosis not present

## 2024-03-28 NOTE — Patient Instructions (Addendum)
 Mr. Richard Dalton , Thank you for taking time out of your busy schedule to complete your Annual Wellness Visit with me. I enjoyed our conversation and look forward to speaking with you again next year. I, as well as your care team,  appreciate your ongoing commitment to your health goals. Please review the following plan we discussed and let me know if I can assist you in the future. Your Game plan/ To Do List    Referrals: If you haven't heard from the office you've been referred to, please reach out to them at the phone provided.    Follow up Visits: Next Medicare AWV with our clinical staff: 04/03/25 @ 8a   Have you seen your provider in the last 6 months (3 months if uncontrolled diabetes)?  Next Office Visit with your provider: 04/27/24 @ 8a  Clinician Recommendations:  Aim for 30 minutes of exercise or brisk walking, 6-8 glasses of water, and 5 servings of fruits and vegetables each day.        This is a list of the screening recommended for you and due dates:  Health Maintenance  Topic Date Due   COVID-19 Vaccine (9 - Moderna risk 2024-25 season) 12/10/2023   Flu Shot  05/06/2024   Complete foot exam   05/12/2024   Hemoglobin A1C  06/24/2024   Eye exam for diabetics  07/26/2024   Yearly kidney function blood test for diabetes  10/29/2024   Yearly kidney health urinalysis for diabetes  10/29/2024   Medicare Annual Wellness Visit  03/28/2025   DTaP/Tdap/Td vaccine (3 - Td or Tdap) 06/13/2025   Colon Cancer Screening  04/11/2027   Pneumococcal Vaccine for age over 95  Completed   Hepatitis C Screening  Completed   Zoster (Shingles) Vaccine  Completed   HPV Vaccine  Aged Out   Meningitis B Vaccine  Aged Out    Advanced directives: (In Chart) A copy of your advanced directives are scanned into your chart should your provider ever need it. Advance Care Planning is important because it:  [x]  Makes sure you receive the medical care that is consistent with your values, goals, and  preferences  [x]  It provides guidance to your family and loved ones and reduces their decisional burden about whether or not they are making the right decisions based on your wishes.  Follow the link provided in your after visit summary or read over the paperwork we have mailed to you to help you started getting your Advance Directives in place. If you need assistance in completing these, please reach out to us  so that we can help you!  See attachments for Preventive Care and Fall Prevention Tips.

## 2024-03-28 NOTE — Progress Notes (Signed)
 Subjective:   Richard Dalton is a 73 y.o. who presents for a Medicare Wellness preventive visit.  As a reminder, Annual Wellness Visits don't include a physical exam, and some assessments may be limited, especially if this visit is performed virtually. We may recommend an in-person follow-up visit with your provider if needed.  Visit Complete: In person    Persons Participating in Visit: Patient.  AWV Questionnaire: Yes: Patient Medicare AWV questionnaire was completed by the patient on 03/23/24; I have confirmed that all information answered by patient is correct and no changes since this date.  Cardiac Risk Factors include: advanced age (>80men, >65 women);male gender;hypertension;diabetes mellitus     Objective:    Today's Vitals   03/28/24 0812  BP: 120/60  Pulse: 87  Temp: 98.1 F (36.7 C)  TempSrc: Oral  SpO2: 95%  Weight: 208 lb 1.6 oz (94.4 kg)  Height: 5' 9 (1.753 m)   Body mass index is 30.73 kg/m.     03/28/2024    8:29 AM 03/27/2023    8:30 AM 03/24/2022    8:40 AM 01/29/2021    8:19 AM 08/21/2015   10:14 AM 08/07/2015    3:26 PM  Advanced Directives  Does Patient Have a Medical Advance Directive? Yes Yes Yes Yes Yes  No   Type of Estate agent of [place];Living will Healthcare Power of [place];Living will Healthcare Power of [place];Living will Healthcare Power of [employer] Power of Attorney    Does patient want to make changes to medical advance directive? No - Patient declined No - Patient declined No - Patient declined     Copy of Healthcare Power of Attorney in Chart? Yes - validated most recent copy scanned in chart (See row information) Yes - validated most recent copy scanned in chart (See row information) Yes - validated most recent copy scanned in chart (See row information) Yes - validated most recent copy scanned in chart (See row information)    Would patient like information on creating a medical advance directive?       No - patient declined information      Data saved with a previous flowsheet row definition    Current Medications (verified) Outpatient Encounter Medications as of 03/28/2024  Medication Sig   atorvastatin  (LIPITOR) 20 MG tablet TAKE 1 TABLET(20 MG) BY MOUTH DAILY   budesonide -formoterol  (SYMBICORT ) 160-4.5 MCG/ACT inhaler INHALE 2 PUFFS INTO THE LUNGS TWICE DAILY   chlorpheniramine (CHLOR-TRIMETON) 4 MG tablet Take 4 mg by mouth every 8 (eight) hours.   empagliflozin  (JARDIANCE ) 25 MG TABS tablet TAKE 1 TABLET BY MOUTH EVERY DAY BEFORE BREAKFAST   fluticasone (FLONASE) 50 MCG/ACT nasal spray Place into both nostrils daily.   levocetirizine (XYZAL) 5 MG tablet Take 5 mg by mouth every evening.   lisinopril  (ZESTRIL ) 5 MG tablet Take 5 mg by mouth daily.   metFORMIN  (GLUCOPHAGE ) 1000 MG tablet Take 1 tablet (1,000 mg total) by mouth daily with breakfast.   montelukast  (SINGULAIR ) 10 MG tablet Take 1 tablet (10 mg total) by mouth at bedtime.   Multiple Vitamin (MULTIVITAMIN PO) Take by mouth.   pioglitazone  (ACTOS ) 15 MG tablet TAKE 1 TABLET(15 MG) BY MOUTH DAILY   Semaglutide , 2 MG/DOSE, (OZEMPIC , 2 MG/DOSE,) 8 MG/3ML SOPN INJECT 2 MG UNDER THE SKIN ONCE WEEKLY   No facility-administered encounter medications on file as of 03/28/2024.    Allergies (verified) Tetracycline   History: Past Medical History:  Diagnosis Date   ALLERGIC RHINITIS 11/12/2006  Allergy    Anemia    Asthma 2009   patient denies this dx   Chronic kidney disease    COPD (chronic obstructive pulmonary disease) (HCC)    DIABETES MELLITUS, TYPE II 08/07/2006   ECZEMA 11/08/2008   ERECTILE DYSFUNCTION 11/12/2006   GENERALIZED VACCINIA AS COMP MEDICAL CARE NEC 09/09/2010   GERD (gastroesophageal reflux disease)    Heart murmur    HYPERLIPIDEMIA 08/07/2006   HYPERTENSION 08/07/2006   Neoplasm of uncertain behavior of skin 02/07/2009   OSTEOARTHRITIS 08/07/2006   hands   PLANTAR FASCIITIS 11/12/2006    Sleep apnea    Past Surgical History:  Procedure Laterality Date   COLONOSCOPY  08/2015   [place]ry-polyps   KNEE ARTHROSCOPY     right   TOE SURGERY Right 09/22/2022   TOENAIL EXCISION  10/2019   TONSILLECTOMY AND ADENOIDECTOMY  1955   WISDOM TOOTH EXTRACTION     Family History  Problem Relation Age of Onset   Diabetes Mother    Alzheimer's disease Father        died age 60   Diabetes Sister        4 sisters with diabetes   Diabetes Brother    Diabetes Daughter    Colon cancer Neg Hx    Esophageal cancer Neg Hx    Stomach cancer Neg Hx    Rectal cancer Neg Hx    Social History   Socioeconomic History   Marital status: Married    Spouse name: Not on file   Number of children: Not on file   Years of education: Not on file   Highest education level: Bachelor's degree (e.g., BA, AB, BS)  Occupational History    Comment: retired  Tobacco Use   Smoking status: Former    Current packs/day: 0.00    Average packs/day: 2.0 packs/day for 20.0 years (40.0 ttl pk-yrs)    Types: Cigarettes    Start date: 03/10/1967    Quit date: 03/10/1987    Years since quitting: 37.0   Smokeless tobacco: Never  Vaping Use   Vaping status: Never Used  Substance and Sexual Activity   Alcohol use: Yes    Alcohol/week: 2.0 standard drinks of alcohol    Types: 2 Standard drinks or equivalent per week    Comment: weekly   Drug use: No   Sexual activity: Yes  Other Topics Concern   Not on file  Social History Narrative   Not on file   Social Drivers of Health   Financial Resource Strain: Low Risk  (03/28/2024)   Overall Financial Resource Strain (CARDIA)    Difficulty of Paying Living Expenses: Not hard at all  Food Insecurity: No Food Insecurity (03/28/2024)   Hunger Vital Sign    Worried About Running Out of Food in the Last Year: Never true    Ran Out of Food in the Last Year: Never true  Transportation Needs: No Transportation Needs (03/28/2024)   PRAPARE - Scientist, research (physical sciences) (Medical): No    Lack of Transportation (Non-Medical): No  Physical Activity: Insufficiently Active (03/28/2024)   Exercise Vital Sign    Days of Exercise per Week: 4 days    Minutes of Exercise per Session: 30 min  Stress: No Stress Concern Present (03/28/2024)   [employer] of Occupational Health - Occupational Stress Questionnaire    Feeling of Stress: Not at all  Social Connections: Moderately Integrated (03/28/2024)   Social Connection and Isolation Panel  Frequency of Communication with Friends and Family: More than three times a week    Frequency of Social Gatherings with Friends and Family: Three times a week    Attends Religious Services: Never    Active Member of Clubs or Organizations: Yes    Attends Engineer, structural: More than 4 times per year    Marital Status: Married    Tobacco Counseling Counseling given: Not Answered    Clinical Intake:  Pre-visit preparation completed: Yes  Pain : No/denies pain     BMI - recorded: 30.73 Nutritional Status: BMI > 30  Obese Nutritional Risks: None Diabetes: Yes CBG done?: Yes (CBG 115 Per patient) CBG resulted in Enter/ Edit results?: Yes Did pt. bring in CBG monitor from home?: No  Lab Results  Component Value Date   HGBA1C 8.5 (H) 10/30/2023   HGBA1C 7.2 (A) 07/31/2023   HGBA1C 6.8 (A) 04/29/2023     How often do you need to have someone help you when you read instructions, pamphlets, or other written materials from your doctor or pharmacy?: 1 - Never  Interpreter Needed?: No  Information entered by :: Rojelio Blush LPN   Activities of Daily Living      03/28/2024    8:28 AM 03/23/2024    8:21 PM  In your present state of health, do you have any difficulty performing the following activities:  Hearing? 0 0  Vision? 0 0  Difficulty concentrating or making decisions? 0 0  Walking or climbing stairs? 0 0  Dressing or bathing? 0 0  Doing errands, shopping? 0 0  Preparing  Food and eating ? N N  Using the Toilet? N N  In the past six months, have you accidently leaked urine? N N  Do you have problems with loss of bowel control? N N  Managing your Medications? N N  Managing your Finances? N N  Housekeeping or managing your Housekeeping? N N    Patient Care Team: Micheal Wolm ORN, MD as PCP - General   I have updated your Care Teams any recent Medical Services you may have received from other providers in the past year.     Assessment:   This is a routine wellness examination for Stepney.  Hearing/Vision screen Hearing Screening - Comments:: Denies hearing difficulties   Vision Screening - Comments:: Wears rx glasses - up to date with routine eye exams with  Dr Robinson   Goals Addressed               This Visit's Progress     Increase physical activity (pt-stated)        Lose weight       Depression Screen      03/28/2024    8:12 AM 07/31/2023    7:32 AM 03/27/2023    8:10 AM 01/27/2023    8:15 AM 10/29/2022    7:23 AM 03/24/2022    8:37 AM 11/15/2021    7:02 AM  PHQ 2/9 Scores  PHQ - 2 Score 0 0 0 0 0 0 0    Fall Risk      03/28/2024    8:27 AM 03/23/2024    8:21 PM 07/31/2023    7:32 AM 03/27/2023    8:29 AM 03/20/2023    3:11 PM  Fall Risk   Falls in the past year? 0 0 0 0 0  Number falls in past yr: 0 0 0 0 0  Injury with Fall? 0 0 0 0  0  Risk for fall due to : No Fall Risks  No Fall Risks No Fall Risks   Follow up Falls evaluation completed  Falls evaluation completed Falls prevention discussed     MEDICARE RISK AT HOME:   Medicare Risk at Home Any stairs in or around the home?: Yes If so, are there any without handrails?: No Home free of loose throw rugs in walkways, pet beds, electrical cords, etc?: Yes Adequate lighting in your home to reduce risk of falls?: Yes Life alert?: No Use of a cane, walker or w/c?: No Grab bars in the bathroom?: Yes Shower chair or bench in shower?: Yes Elevated toilet seat or a  handicapped toilet?: No  TIMED UP AND GO:  Was the test performed?  Yes  Length of time to ambulate 10 feet: 10 sec Gait steady and fast without use of assistive device  Cognitive Function: 6CIT completed        03/28/2024    8:28 AM 03/27/2023    8:30 AM 03/24/2022    8:41 AM 01/29/2021    8:23 AM  6CIT Screen  What Year? 0 points 0 points 0 points 0 points  What month? 0 points 0 points 0 points 0 points  What time? 0 points 0 points 0 points   Count back from 20 0 points 0 points 0 points 0 points  Months in reverse 0 points 0 points 0 points 0 points  Repeat phrase 0 points 0 points 0 points 0 points  Total Score 0 points 0 points 0 points     Immunizations Immunization History  Administered Date(s) Administered   Fluad Quad(high Dose 65+) 06/05/2021, 05/23/2022   Influenza Split 07/10/2011, 06/04/2013   Influenza Whole 07/02/2006, 08/11/2007, 07/24/2008, 07/05/2010, 06/27/2012   Influenza, High Dose Seasonal PF 06/17/2016, 07/11/2017, 05/28/2019   Influenza-Unspecified 06/03/2014, 06/10/2015, 07/11/2017, 05/22/2018, 05/28/2019, 06/12/2020, 06/12/2023   Moderna Covid-19 Fall Seasonal Vaccine 74yrs & older 01/07/2023   Moderna Covid-19 Vaccine Bivalent Booster 38yrs & up 08/26/2021   Moderna Sars-Covid-2 Vaccination 10/12/2019, 11/09/2019, 05/20/2020, 07/18/2020   PFIZER Comirnaty(Gray Top)Covid-19 Tri-Sucrose Vaccine 07/10/2022   Pneumococcal Conjugate-13 09/13/2014   Pneumococcal Polysaccharide-23 10/07/2007, 10/17/2016   Rsv, Bivalent, Protein Subunit Rsvpref,pf (Abrysvo) 06/11/2022   Td 10/07/2003   Tdap 06/14/2015   Unspecified SARS-COV-2 Vaccination 06/12/2023   Zoster Recombinant(Shingrix) 05/26/2001, 05/26/2021   Zoster, Live 11/05/2013    Screening Tests Health Maintenance  Topic Date Due   COVID-19 Vaccine (9 - Moderna risk 2024-25 season) 12/10/2023   INFLUENZA VACCINE  05/06/2024   FOOT EXAM  05/12/2024   HEMOGLOBIN A1C  06/24/2024   OPHTHALMOLOGY  EXAM  07/26/2024   Diabetic kidney evaluation - eGFR measurement  10/29/2024   Diabetic kidney evaluation - Urine ACR  10/29/2024   Medicare Annual Wellness (AWV)  03/28/2025   DTaP/Tdap/Td (3 - Td or Tdap) 06/13/2025   Colonoscopy  04/11/2027   Pneumococcal Vaccine: 50+ Years  Completed   Hepatitis C Screening  Completed   Zoster Vaccines- Shingrix  Completed   HPV VACCINES  Aged Out   Meningococcal B Vaccine  Aged Out    Health Maintenance  Health Maintenance Due  Topic Date Due   COVID-19 Vaccine (9 - Moderna risk 2024-25 season) 12/10/2023   Health Maintenance Items Addressed:   Additional Screening:  Vision Screening: Recommended annual ophthalmology exams for early detection of glaucoma and other disorders of the eye. Would you like a referral to an eye doctor? No    Dental Screening: Recommended annual  dental exams for proper oral hygiene  Community Resource Referral / Chronic Care Management: CRR required this visit?  No   CCM required this visit?  No   Plan:    I have personally reviewed and noted the following in the patient's chart:   Medical and social history Use of alcohol, tobacco or illicit drugs  Current medications and supplements including opioid prescriptions. Patient is not currently taking opioid prescriptions. Functional ability and status Nutritional status Physical activity Advanced directives List of other physicians Hospitalizations, surgeries, and ER visits in previous 12 months Vitals Screenings to include cognitive, depression, and falls Referrals and appointments  In addition, I have reviewed and discussed with patient certain preventive protocols, quality metrics, and best practice recommendations. A written personalized care plan for preventive services as well as general preventive health recommendations were provided to patient.   Rojelio LELON Blush, LPN   [id number]   After Visit Summary: (In Person-Printed) AVS printed and  given to the patient  Notes: Nothing significant to report at this time.

## 2024-03-29 ENCOUNTER — Other Ambulatory Visit: Payer: Self-pay | Admitting: Family Medicine

## 2024-04-05 DIAGNOSIS — E119 Type 2 diabetes mellitus without complications: Secondary | ICD-10-CM | POA: Diagnosis not present

## 2024-04-19 ENCOUNTER — Encounter: Payer: Self-pay | Admitting: Family Medicine

## 2024-04-27 ENCOUNTER — Encounter: Payer: Self-pay | Admitting: Family Medicine

## 2024-04-27 ENCOUNTER — Ambulatory Visit (INDEPENDENT_AMBULATORY_CARE_PROVIDER_SITE_OTHER): Admitting: Family Medicine

## 2024-04-27 VITALS — BP 132/68 | HR 87 | Temp 97.8°F | Wt 207.7 lb

## 2024-04-27 DIAGNOSIS — E785 Hyperlipidemia, unspecified: Secondary | ICD-10-CM | POA: Diagnosis not present

## 2024-04-27 DIAGNOSIS — E1165 Type 2 diabetes mellitus with hyperglycemia: Secondary | ICD-10-CM | POA: Diagnosis not present

## 2024-04-27 DIAGNOSIS — N1831 Chronic kidney disease, stage 3a: Secondary | ICD-10-CM | POA: Diagnosis not present

## 2024-04-27 DIAGNOSIS — I10 Essential (primary) hypertension: Secondary | ICD-10-CM | POA: Diagnosis not present

## 2024-04-27 DIAGNOSIS — Z7985 Long-term (current) use of injectable non-insulin antidiabetic drugs: Secondary | ICD-10-CM

## 2024-04-27 DIAGNOSIS — Z7984 Long term (current) use of oral hypoglycemic drugs: Secondary | ICD-10-CM

## 2024-04-27 LAB — POCT GLYCOSYLATED HEMOGLOBIN (HGB A1C): Hemoglobin A1C: 6.6 % — AB (ref 4.0–5.6)

## 2024-04-27 NOTE — Patient Instructions (Signed)
 A1C greatly improved to 6.6%  Continue current dose of medications.

## 2024-04-27 NOTE — Progress Notes (Signed)
 Established Patient Office Visit  Subjective   Patient ID: Richard Dalton, male    DOB: [birth year]  Age: 73 y.o. MRN: [id number]  Chief Complaint  Patient presents with   Medical Management of Chronic Issues    HPI   Markcus seen for ongoing medical follow-up.  Generally doing well.  He has lost some additional weight since last spring after increasing his Ozempic  dose to 2 mg.  Last winter he had A1c of 8.5 and this came down to 7.3 when checked per nephrologist back in the spring.  A1c further improved today to 6.6%.  Last GFR 47.  His blood pressure has been stable.  Currently taking only lisinopril  5 mg daily for blood pressure.  Takes atorvastatin  20 mg daily for hyperlipidemia.  Lipids were checked in January with LDL of 60.  Denies any myalgias.  Has upcoming trip in August to [place].  He has a cousin who lives there  Past Medical History:  Diagnosis Date   ALLERGIC RHINITIS 11/12/2006   Allergy    Anemia    Asthma 2009   patient denies this dx   Chronic kidney disease    COPD (chronic obstructive pulmonary disease) (HCC)    DIABETES MELLITUS, TYPE II 08/07/2006   ECZEMA 11/08/2008   ERECTILE DYSFUNCTION 11/12/2006   GENERALIZED VACCINIA AS COMP MEDICAL CARE NEC 09/09/2010   GERD (gastroesophageal reflux disease)    Heart murmur    HYPERLIPIDEMIA 08/07/2006   HYPERTENSION 08/07/2006   Neoplasm of uncertain behavior of skin 02/07/2009   OSTEOARTHRITIS 08/07/2006   hands   PLANTAR FASCIITIS 11/12/2006   Sleep apnea    Past Surgical History:  Procedure Laterality Date   COLONOSCOPY  08/2015   [place]ry-polyps   KNEE ARTHROSCOPY     right   TOE SURGERY Right 09/22/2022   TOENAIL EXCISION  10/2019   TONSILLECTOMY AND ADENOIDECTOMY  1955   WISDOM TOOTH EXTRACTION      reports that he quit smoking about 37 years ago. His smoking use included cigarettes. He started smoking about 57 years ago. He has a 40 pack-year smoking history. He has never used smokeless tobacco.  He reports current alcohol use of about 2.0 standard drinks of alcohol per week. He reports that he does not use drugs. family history includes Alzheimer's disease in his father; Diabetes in his brother, daughter, mother, and sister. Allergies  Allergen Reactions   Tetracycline     REACTION: rash      Review of Systems  Constitutional:  Negative for malaise/fatigue.  Eyes:  Negative for blurred vision.  Respiratory:  Negative for shortness of breath.   Cardiovascular:  Negative for chest pain.  Neurological:  Negative for dizziness, weakness and headaches.      Objective:     BP 132/68 (BP Location: Left Arm, Cuff Size: Normal)   Pulse 87   Temp 97.8 F (36.6 C) (Oral)   Wt 207 lb 11.2 oz (94.2 kg)   SpO2 96%   BMI 30.67 kg/m  BP Readings from Last 3 Encounters:  04/27/24 132/68  03/28/24 120/60  01/27/24 138/68   Wt Readings from Last 3 Encounters:  04/27/24 207 lb 11.2 oz (94.2 kg)  03/28/24 208 lb 1.6 oz (94.4 kg)  01/27/24 213 lb 4.8 oz (96.8 kg)      Physical Exam Vitals reviewed.  Constitutional:      General: He is not in acute distress.    Appearance: He is well-developed. He is not ill-appearing.  Eyes:  Pupils: Pupils are equal, round, and reactive to light.  Neck:     Thyroid: No thyromegaly.  Cardiovascular:     Rate and Rhythm: Normal rate.     Comments: Occasional irregular beat Pulmonary:     Effort: Pulmonary effort is normal. No respiratory distress.     Breath sounds: Normal breath sounds. No wheezing or rales.  Musculoskeletal:     Cervical back: Neck supple.  Neurological:     Mental Status: He is alert and oriented to person, place, and time.      Results for orders placed or performed in visit on 04/27/24  POC HgB A1c  Result Value Ref Range   Hemoglobin A1C 6.6 (A) 4.0 - 5.6 %   HbA1c POC (<> result, manual entry)     HbA1c, POC (prediabetic range)     HbA1c, POC (controlled diabetic range)      Last CBC Lab Results   Component Value Date   WBC 5.6 11/20/2017   HGB 16.0 12/15/2022   HCT 45.8 11/20/2017   MCV 94.2 11/20/2017   RDW 14.2 11/20/2017   PLT 231.0 11/20/2017   Last metabolic panel Lab Results  Component Value Date   GLUCOSE 170 (H) 10/30/2023   NA 139 10/30/2023   K 4.0 10/30/2023   CL 101 10/30/2023   CO2 30 10/30/2023   BUN 26 (H) 10/30/2023   CREATININE 1.52 (H) 10/30/2023   GFR 45.02 (L) 10/30/2023   CALCIUM  8.8 10/30/2023   PROT 6.6 10/30/2023   ALBUMIN 3.8 10/30/2023   BILITOT 0.7 10/30/2023   ALKPHOS 82 10/30/2023   AST 20 10/30/2023   ALT 21 10/30/2023   Last lipids Lab Results  Component Value Date   CHOL 143 10/30/2023   HDL 46.60 10/30/2023   LDLCALC 60 10/30/2023   LDLDIRECT 80.0 11/15/2019   TRIG 178.0 (H) 10/30/2023   CHOLHDL 3 10/30/2023   Last hemoglobin A1c Lab Results  Component Value Date   HGBA1C 6.6 (A) 04/27/2024      The 10-year ASCVD risk score (Arnett DK, et al., 2019) is: 43.6%    Assessment & Plan:   #1 type 2 diabetes improved with A1c 6.6%.  Continue current diabetic drug regimen of Jardiance , Ozempic , Actos , metformin .  Continue yearly diabetic eye exam.  Recheck in 3 months  #2 hypertension.  Initial blood pressure but did improve some after rest.  Continue to monitor.  Goal blood pressure less than 130/80.  Continue lisinopril .  #3 hyperlipidemia treated with atorvastatin  20 mg daily.  Tolerating well.  Lipids were checked in January and stable.  Continue low saturated fat diet  #4 chronic kidney disease followed per nephrology   Return in about 3 months (around 07/28/2024).    Wolm Scarlet, MD

## 2024-04-30 ENCOUNTER — Other Ambulatory Visit: Payer: Self-pay | Admitting: Family Medicine

## 2024-05-06 DIAGNOSIS — E119 Type 2 diabetes mellitus without complications: Secondary | ICD-10-CM | POA: Diagnosis not present

## 2024-05-09 ENCOUNTER — Other Ambulatory Visit: Payer: Self-pay | Admitting: Family Medicine

## 2024-05-09 ENCOUNTER — Encounter: Payer: Self-pay | Admitting: Family Medicine

## 2024-05-09 MED ORDER — OZEMPIC (2 MG/DOSE) 8 MG/3ML ~~LOC~~ SOPN: PEN_INJECTOR | SUBCUTANEOUS | 1 refills | Status: DC

## 2024-05-29 ENCOUNTER — Other Ambulatory Visit: Payer: Self-pay | Admitting: Family Medicine

## 2024-05-30 ENCOUNTER — Encounter: Payer: Self-pay | Admitting: Family Medicine

## 2024-06-06 DIAGNOSIS — E119 Type 2 diabetes mellitus without complications: Secondary | ICD-10-CM | POA: Diagnosis not present

## 2024-06-15 ENCOUNTER — Ambulatory Visit (INDEPENDENT_AMBULATORY_CARE_PROVIDER_SITE_OTHER): Admitting: Podiatry

## 2024-06-15 DIAGNOSIS — M79675 Pain in left toe(s): Secondary | ICD-10-CM

## 2024-06-15 DIAGNOSIS — B351 Tinea unguium: Secondary | ICD-10-CM | POA: Diagnosis not present

## 2024-06-15 DIAGNOSIS — M79674 Pain in right toe(s): Secondary | ICD-10-CM | POA: Diagnosis not present

## 2024-06-15 NOTE — Progress Notes (Signed)
  Subjective:  Patient ID: Richard Dalton, male    DOB: [birth year],  MRN: [id number]  Chief Complaint  Patient presents with   Nail Problem    Nail trim    73 y.o. male returns for the above complaint.  Patient presents with thickened elongated dystrophic mycotic toenails x 10 mild pain on palpation hurts with ambulation and likely debride is able to do it himself.  Objective:  There were no vitals filed for this visit. Podiatric Exam: Vascular: dorsalis pedis and posterior tibial pulses are palpable bilateral. Capillary return is immediate. Temperature gradient is WNL. Skin turgor WNL  Sensorium: Normal Semmes Weinstein monofilament test. Normal tactile sensation bilaterally. Nail Exam: Pt has thick disfigured discolored nails with subungual debris noted bilateral entire nail hallux through fifth toenails.  Pain on palpation to the nails. Ulcer Exam: There is no evidence of ulcer or pre-ulcerative changes or infection. Orthopedic Exam: Muscle tone and strength are WNL. No limitations in general ROM. No crepitus or effusions noted.  Skin: No Porokeratosis. No infection or ulcers    Assessment & Plan:   No diagnosis found.   Patient was evaluated and treated and all questions answered.  Onychomycosis with pain  -Nails palliatively debrided as below. -Educated on self-care  Procedure: Nail Debridement Rationale: pain  Type of Debridement: manual, sharp debridement. Instrumentation: Nail nipper, rotary burr. Number of Nails: 10  Procedures and Treatment: Consent by patient was obtained for treatment procedures. The patient understood the discussion of treatment and procedures well. All questions were answered thoroughly reviewed. Debridement of mycotic and hypertrophic toenails, 1 through 5 bilateral and clearing of subungual debris. No ulceration, no infection noted.  Return Visit-Office Procedure: Patient instructed to return to the office for a follow up visit 3 months for  continued evaluation and treatment.  Tinnie Forehand, DPM    No follow-ups on file.

## 2024-06-16 ENCOUNTER — Other Ambulatory Visit: Payer: Self-pay | Admitting: Family Medicine

## 2024-07-06 DIAGNOSIS — E119 Type 2 diabetes mellitus without complications: Secondary | ICD-10-CM | POA: Diagnosis not present

## 2024-07-08 ENCOUNTER — Ambulatory Visit (INDEPENDENT_AMBULATORY_CARE_PROVIDER_SITE_OTHER): Admitting: Podiatry

## 2024-07-08 DIAGNOSIS — L603 Nail dystrophy: Secondary | ICD-10-CM

## 2024-07-08 NOTE — Progress Notes (Signed)
 Subjective:  Patient ID: Richard Dalton, male    DOB: 09-18-1951,  MRN: [id number]  Chief Complaint  Patient presents with   Nail Problem    73 y.o. male presents with the above complaint.  Patient presents with left hallux nail dystrophy painful to touch.  He would like to have it removed he does not want to make it permanent.  Denies any other acute complaints.  Pain scale 7 out of 10 dull aching nature he is a diabetic.   Review of Systems: Negative except as noted in the HPI. Denies N/V/F/Ch.  Past Medical History:  Diagnosis Date   ALLERGIC RHINITIS 11/12/2006   Allergy    Anemia    Asthma 2009   patient denies this dx   Chronic kidney disease    COPD (chronic obstructive pulmonary disease) (HCC)    DIABETES MELLITUS, TYPE II 08/07/2006   ECZEMA 11/08/2008   ERECTILE DYSFUNCTION 11/12/2006   GENERALIZED VACCINIA AS COMP MEDICAL CARE NEC 09/09/2010   GERD (gastroesophageal reflux disease)    Heart murmur    HYPERLIPIDEMIA 08/07/2006   HYPERTENSION 08/07/2006   Neoplasm of uncertain behavior of skin 02/07/2009   OSTEOARTHRITIS 08/07/2006   hands   PLANTAR FASCIITIS 11/12/2006   Sleep apnea     Current Outpatient Medications:    atorvastatin  (LIPITOR) 20 MG tablet, TAKE 1 TABLET(20 MG) BY MOUTH DAILY, Disp: 90 tablet, Rfl: 0   budesonide -formoterol  (SYMBICORT ) 160-4.5 MCG/ACT inhaler, INHALE 2 PUFFS INTO THE LUNGS TWICE DAILY, Disp: 10.2 g, Rfl: 2   chlorpheniramine (CHLOR-TRIMETON) 4 MG tablet, Take 4 mg by mouth every 8 (eight) hours., Disp: , Rfl:    empagliflozin  (JARDIANCE ) 25 MG TABS tablet, TAKE 1 TABLET BY MOUTH EVERY DAY BEFORE BREAKFAST, Disp: 90 tablet, Rfl: 1   fluticasone (FLONASE) 50 MCG/ACT nasal spray, Place into both nostrils daily., Disp: , Rfl:    levocetirizine (XYZAL) 5 MG tablet, Take 5 mg by mouth every evening., Disp: , Rfl:    lisinopril  (ZESTRIL ) 5 MG tablet, Take 1 tablet (5 mg total) by mouth daily., Disp: 90 tablet, Rfl: 3   metFORMIN   (GLUCOPHAGE ) 1000 MG tablet, Take 1 tablet (1,000 mg total) by mouth daily with breakfast., Disp: 90 tablet, Rfl: 1   montelukast  (SINGULAIR ) 10 MG tablet, TAKE 1 TABLET(10 MG) BY MOUTH AT BEDTIME, Disp: 90 tablet, Rfl: 1   Multiple Vitamin (MULTIVITAMIN PO), Take by mouth., Disp: , Rfl:    pioglitazone  (ACTOS ) 15 MG tablet, TAKE 1 TABLET(15 MG) BY MOUTH DAILY, Disp: 90 tablet, Rfl: 0   Semaglutide , 2 MG/DOSE, (OZEMPIC , 2 MG/DOSE,) 8 MG/3ML SOPN, INJECT 2 MG UNDER THE SKIN ONCE WEEKLY, Disp: 6 mL, Rfl: 1  Social History   Tobacco Use  Smoking Status Former   Current packs/day: 0.00   Average packs/day: 2.0 packs/day for 20.0 years (40.0 ttl pk-yrs)   Types: Cigarettes   Start date: 03/10/1967   Quit date: 03/10/1987   Years since quitting: 37.3  Smokeless Tobacco Never    Allergies  Allergen Reactions   Tetracycline     REACTION: rash   Objective:  There were no vitals filed for this visit. There is no height or weight on file to calculate BMI. Constitutional Well developed. Well nourished.  Vascular Dorsalis pedis pulses palpable bilaterally. Posterior tibial pulses palpable bilaterally. Capillary refill normal to all digits.  No cyanosis or clubbing noted. Pedal hair growth normal.  Neurologic Normal speech. Oriented to person, place, and time. Epicritic sensation to light touch grossly present  bilaterally.  Dermatologic Pain on palpation of the entire/total nail on 1st digit of the left No other open wounds. No skin lesions.  Orthopedic: Normal joint ROM without pain or crepitus bilaterally. No visible deformities. No bony tenderness.   Radiographs: None Assessment:  No diagnosis found. Plan:  Patient was evaluated and treated and all questions answered.  Nail contusion/dystrophy hallux, left -Patient elects to proceed with minor surgery to remove entire toenail today. Consent reviewed and signed by patient. -Entire/total nail excised. See procedure note. -Educated  on post-procedure care including soaking. Written instructions provided and reviewed. -Patient to follow up in 2 weeks for nail check.  Procedure: Excision of entire/total nail  Location: Left 1st toe digit Anesthesia: Lidocaine 1% plain; 1.5 mL and Marcaine 0.5% plain; 1.5 mL, digital block. Skin Prep: Betadine. Dressing: Silvadene; telfa; dry, sterile, compression dressing. Technique: Following skin prep, the toe was exsanguinated and a tourniquet was secured at the base of the toe. The affected nail border was freed and excised. The tourniquet was then removed and sterile dressing applied. Disposition: Patient tolerated procedure well. Patient to return in 2 weeks for follow-up.   No follow-ups on file.

## 2024-07-10 ENCOUNTER — Encounter: Payer: Self-pay | Admitting: Family Medicine

## 2024-07-11 MED ORDER — LISINOPRIL 5 MG PO TABS: 5.0000 mg | ORAL_TABLET | Freq: Every day | ORAL | 3 refills | Status: AC

## 2024-07-14 ENCOUNTER — Encounter: Payer: Self-pay | Admitting: Podiatry

## 2024-07-18 ENCOUNTER — Other Ambulatory Visit: Payer: Self-pay | Admitting: Family Medicine

## 2024-07-27 ENCOUNTER — Encounter: Payer: Self-pay | Admitting: Family Medicine

## 2024-07-27 ENCOUNTER — Ambulatory Visit: Admitting: Family Medicine

## 2024-07-27 VITALS — BP 136/84 | HR 94 | Temp 98.0°F | Wt 208.0 lb

## 2024-07-27 DIAGNOSIS — E785 Hyperlipidemia, unspecified: Secondary | ICD-10-CM | POA: Diagnosis not present

## 2024-07-27 DIAGNOSIS — Z7985 Long-term (current) use of injectable non-insulin antidiabetic drugs: Secondary | ICD-10-CM

## 2024-07-27 DIAGNOSIS — H5213 Myopia, bilateral: Secondary | ICD-10-CM | POA: Diagnosis not present

## 2024-07-27 DIAGNOSIS — Z7984 Long term (current) use of oral hypoglycemic drugs: Secondary | ICD-10-CM | POA: Diagnosis not present

## 2024-07-27 DIAGNOSIS — E1165 Type 2 diabetes mellitus with hyperglycemia: Secondary | ICD-10-CM | POA: Diagnosis not present

## 2024-07-27 DIAGNOSIS — I10 Essential (primary) hypertension: Secondary | ICD-10-CM | POA: Diagnosis not present

## 2024-07-27 LAB — OPHTHALMOLOGY REPORT-SCANNED

## 2024-07-27 LAB — POCT GLYCOSYLATED HEMOGLOBIN (HGB A1C): Hemoglobin A1C: 6.5 % — AB (ref 4.0–5.6)

## 2024-07-27 MED ORDER — OZEMPIC (2 MG/DOSE) 8 MG/3ML ~~LOC~~ SOPN: PEN_INJECTOR | SUBCUTANEOUS | 1 refills | Status: AC

## 2024-07-27 MED ORDER — PIOGLITAZONE HCL 15 MG PO TABS: ORAL_TABLET | ORAL | 1 refills | Status: AC

## 2024-07-27 MED ORDER — ATORVASTATIN CALCIUM 20 MG PO TABS: ORAL_TABLET | ORAL | 1 refills | Status: AC

## 2024-07-27 MED ORDER — EMPAGLIFLOZIN 25 MG PO TABS: ORAL_TABLET | ORAL | 1 refills | Status: AC

## 2024-07-27 NOTE — Progress Notes (Signed)
 Established Patient Office Visit  Subjective   Patient ID: Richard Dalton, male    DOB: [birth year]  Age: 73 y.o. MRN: [id number]  Chief Complaint  Patient presents with   Medical Management of Chronic Issues    HPI   Richard Dalton is seen for routine medical follow-up.  He had recent toenail removed per podiatry.  This was left great toenail.  Healing well.  No signs of secondary infection.  Richard Dalton has history of hypertension, type 2 diabetes, chronic kidney disease stage III with GFR around 45, hyperlipidemia.  Medications reviewed.  Compliant with all.  Blood sugars have improved on current regimen which includes Ozempic , Actos , metformin , Jardiance .  He remains on atorvastatin  20 mg daily for hyperlipidemia.  Lipids were checked last January.  Denies any recent chest pains.  He has been exercising fairly consistently.  Weight essentially unchanged since July visit.  He is due for eye exam and has this scheduled for later today.  Past Medical History:  Diagnosis Date   ALLERGIC RHINITIS 11/12/2006   Allergy    Anemia    Asthma 2009   patient denies this dx   Chronic kidney disease    COPD (chronic obstructive pulmonary disease) (HCC)    DIABETES MELLITUS, TYPE II 08/07/2006   ECZEMA 11/08/2008   ERECTILE DYSFUNCTION 11/12/2006   GENERALIZED VACCINIA AS COMP MEDICAL CARE NEC 09/09/2010   GERD (gastroesophageal reflux disease)    Heart murmur    HYPERLIPIDEMIA 08/07/2006   HYPERTENSION 08/07/2006   Neoplasm of uncertain behavior of skin 02/07/2009   OSTEOARTHRITIS 08/07/2006   hands   PLANTAR FASCIITIS 11/12/2006   Sleep apnea    Past Surgical History:  Procedure Laterality Date   COLONOSCOPY  08/2015   Perry-polyps   KNEE ARTHROSCOPY     right   TOE SURGERY Right 09/22/2022   TOENAIL EXCISION  10/2019   TONSILLECTOMY AND ADENOIDECTOMY  1955   WISDOM TOOTH EXTRACTION      reports that he quit smoking about 37 years ago. His smoking use included cigarettes. He started  smoking about 57 years ago. He has a 40 pack-year smoking history. He has never used smokeless tobacco. He reports current alcohol use of about 2.0 standard drinks of alcohol per week. He reports that he does not use drugs. family history includes Alzheimer's disease in his father; Diabetes in his brother, daughter, mother, and sister. Allergies  Allergen Reactions   Tetracycline     REACTION: rash    Review of Systems  Constitutional:  Negative for malaise/fatigue.  Respiratory:  Negative for shortness of breath.   Cardiovascular:  Negative for chest pain.  Neurological:  Negative for dizziness, weakness and headaches.      Objective:     BP 136/84   Pulse 94   Temp 98 F (36.7 C) (Oral)   Wt 208 lb (94.3 kg)   SpO2 98%   BMI 30.72 kg/m  BP Readings from Last 3 Encounters:  07/27/24 136/84  04/27/24 132/68  03/28/24 120/60   Wt Readings from Last 3 Encounters:  07/27/24 208 lb (94.3 kg)  04/27/24 207 lb 11.2 oz (94.2 kg)  03/28/24 208 lb 1.6 oz (94.4 kg)      Physical Exam Vitals reviewed.  Constitutional:      General: He is not in acute distress.    Appearance: He is not ill-appearing.  Cardiovascular:     Rate and Rhythm: Normal rate and regular rhythm.  Pulmonary:     Effort: Pulmonary effort  is normal.     Breath sounds: Normal breath sounds. No wheezing or rales.  Musculoskeletal:     Right lower leg: No edema.     Left lower leg: No edema.  Skin:    Comments: Left great toe examined.  He has no signs of secondary infection.  Recent extraction of toenail.  No foot lesions.  Normal sensory function to touch.  Good capillary refill.  Neurological:     Mental Status: He is alert.      Results for orders placed or performed in visit on 07/27/24  POC HgB A1c  Result Value Ref Range   Hemoglobin A1C 6.5 (A) 4.0 - 5.6 %   HbA1c POC (<> result, manual entry)     HbA1c, POC (prediabetic range)     HbA1c, POC (controlled diabetic range)      Last  CBC Lab Results  Component Value Date   WBC 5.6 11/20/2017   HGB 16.0 12/15/2022   HCT 45.8 11/20/2017   MCV 94.2 11/20/2017   RDW 14.2 11/20/2017   PLT 231.0 11/20/2017   Last metabolic panel Lab Results  Component Value Date   GLUCOSE 170 (H) 10/30/2023   NA 139 10/30/2023   K 4.0 10/30/2023   CL 101 10/30/2023   CO2 30 10/30/2023   BUN 26 (H) 10/30/2023   CREATININE 1.52 (H) 10/30/2023   GFR 45.02 (L) 10/30/2023   CALCIUM  8.8 10/30/2023   PROT 6.6 10/30/2023   ALBUMIN 3.8 10/30/2023   BILITOT 0.7 10/30/2023   ALKPHOS 82 10/30/2023   AST 20 10/30/2023   ALT 21 10/30/2023   Last lipids Lab Results  Component Value Date   CHOL 143 10/30/2023   HDL 46.60 10/30/2023   LDLCALC 60 10/30/2023   LDLDIRECT 80.0 11/15/2019   TRIG 178.0 (H) 10/30/2023   CHOLHDL 3 10/30/2023   Last hemoglobin A1c Lab Results  Component Value Date   HGBA1C 6.5 (A) 07/27/2024      The 10-year ASCVD risk score (Arnett DK, et al., 2019) is: 45.4%    Assessment & Plan:   #1 type 2 diabetes controlled with A1c 6.5%.  Continue multidrug regimen as above.  He has diabetic eye exam for later today.  Check urine microalbumin with full labs next visit.  #2 hypertension stable.  Followed also per nephrology.  He is on low-dose lisinopril .  This was reduced recently from 10 to 5 with systolic reading of 104 at nephrologist.  Continue to monitor.  #3 hyperlipidemia.  Patient on atorvastatin .  Goal LDL less than 70.  Recheck fasting lipids at 66-month follow-up.   Return in about 3 months (around 10/27/2024).    Wolm Scarlet, MD

## 2024-07-27 NOTE — Patient Instructions (Signed)
 A1C today was 6.5%.    keep up the good work!  Let's plan on 3 month follow up.

## 2024-08-06 DIAGNOSIS — E119 Type 2 diabetes mellitus without complications: Secondary | ICD-10-CM | POA: Diagnosis not present

## 2024-09-08 DIAGNOSIS — K080 Exfoliation of teeth due to systemic causes: Secondary | ICD-10-CM | POA: Diagnosis not present

## 2024-09-14 ENCOUNTER — Ambulatory Visit: Admitting: Podiatry

## 2024-09-14 DIAGNOSIS — M79675 Pain in left toe(s): Secondary | ICD-10-CM | POA: Diagnosis not present

## 2024-09-14 DIAGNOSIS — B351 Tinea unguium: Secondary | ICD-10-CM

## 2024-09-14 DIAGNOSIS — M79674 Pain in right toe(s): Secondary | ICD-10-CM

## 2024-09-14 NOTE — Progress Notes (Signed)
°  Subjective:  Patient ID: Richard Dalton, male    DOB: [birth year],  MRN: [id number]  Chief Complaint  Patient presents with   Nail Problem    Nail trim   73 y.o. male returns for the above complaint.  Patient presents with thickened elongated dystrophic mycotic toenails x 10 mild pain on palpation hurts with ambulation and likely debride is able to do it himself.  Objective:  There were no vitals filed for this visit. Podiatric Exam: Vascular: dorsalis pedis and posterior tibial pulses are palpable bilateral. Capillary return is immediate. Temperature gradient is WNL. Skin turgor WNL  Sensorium: Normal Semmes Weinstein monofilament test. Normal tactile sensation bilaterally. Nail Exam: Pt has thick disfigured discolored nails with subungual debris noted bilateral entire nail hallux through fifth toenails.  Pain on palpation to the nails. Ulcer Exam: There is no evidence of ulcer or pre-ulcerative changes or infection. Orthopedic Exam: Muscle tone and strength are WNL. No limitations in general ROM. No crepitus or effusions noted.  Skin: No Porokeratosis. No infection or ulcers    Assessment & Plan:   1. Pain due to onychomycosis of toenails of both feet      Patient was evaluated and treated and all questions answered.  Onychomycosis with pain  -Nails palliatively debrided as below. -Educated on self-care  Procedure: Nail Debridement Rationale: pain  Type of Debridement: manual, sharp debridement. Instrumentation: Nail nipper, rotary burr. Number of Nails: 10  Procedures and Treatment: Consent by patient was obtained for treatment procedures. The patient understood the discussion of treatment and procedures well. All questions were answered thoroughly reviewed. Debridement of mycotic and hypertrophic toenails, 1 through 5 bilateral and clearing of subungual debris. No ulceration, no infection noted.  Return Visit-Office Procedure: Patient instructed to return to the office for  a follow up visit 3 months for continued evaluation and treatment.  Franky Blanch, DPM    No follow-ups on file.

## 2024-10-03 ENCOUNTER — Encounter: Payer: Self-pay | Admitting: Family Medicine

## 2024-10-03 MED ORDER — BUDESONIDE-FORMOTEROL FUMARATE 160-4.5 MCG/ACT IN AERO: 2.0000 | INHALATION_SPRAY | Freq: Two times a day (BID) | RESPIRATORY_TRACT | 2 refills | Status: AC

## 2024-10-28 ENCOUNTER — Ambulatory Visit: Admitting: Family Medicine

## 2024-10-28 ENCOUNTER — Encounter: Payer: Self-pay | Admitting: Family Medicine

## 2024-10-28 VITALS — BP 142/60 | HR 96 | Temp 97.7°F | Wt 212.3 lb

## 2024-10-28 DIAGNOSIS — E785 Hyperlipidemia, unspecified: Secondary | ICD-10-CM | POA: Diagnosis not present

## 2024-10-28 DIAGNOSIS — N1831 Chronic kidney disease, stage 3a: Secondary | ICD-10-CM

## 2024-10-28 DIAGNOSIS — I10 Essential (primary) hypertension: Secondary | ICD-10-CM

## 2024-10-28 DIAGNOSIS — C61 Malignant neoplasm of prostate: Secondary | ICD-10-CM | POA: Diagnosis not present

## 2024-10-28 DIAGNOSIS — E1165 Type 2 diabetes mellitus with hyperglycemia: Secondary | ICD-10-CM

## 2024-10-28 DIAGNOSIS — Z7984 Long term (current) use of oral hypoglycemic drugs: Secondary | ICD-10-CM

## 2024-10-28 LAB — CBC WITH DIFFERENTIAL/PLATELET
Basophils Absolute: 0.1 K/uL (ref 0.0–0.1)
Basophils Relative: 1.2 % (ref 0.0–3.0)
Eosinophils Absolute: 0.5 K/uL (ref 0.0–0.7)
Eosinophils Relative: 8.1 % — ABNORMAL HIGH (ref 0.0–5.0)
HCT: 42.6 % (ref 39.0–52.0)
Hemoglobin: 14.5 g/dL (ref 13.0–17.0)
Lymphocytes Relative: 22.9 % (ref 12.0–46.0)
Lymphs Abs: 1.3 K/uL (ref 0.7–4.0)
MCHC: 33.9 g/dL (ref 30.0–36.0)
MCV: 91.8 fl (ref 78.0–100.0)
Monocytes Absolute: 0.6 K/uL (ref 0.1–1.0)
Monocytes Relative: 10.5 % (ref 3.0–12.0)
Neutro Abs: 3.4 K/uL (ref 1.4–7.7)
Neutrophils Relative %: 57.3 % (ref 43.0–77.0)
Platelets: 202 K/uL (ref 150.0–400.0)
RBC: 4.65 Mil/uL (ref 4.22–5.81)
RDW: 13.2 % (ref 11.5–15.5)
WBC: 5.9 K/uL (ref 4.0–10.5)

## 2024-10-28 LAB — LIPID PANEL
Cholesterol: 122 mg/dL (ref 28–200)
HDL: 47.4 mg/dL
LDL Cholesterol: 54 mg/dL (ref 10–99)
NonHDL: 74.53
Total CHOL/HDL Ratio: 3
Triglycerides: 102 mg/dL (ref 10.0–149.0)
VLDL: 20.4 mg/dL (ref 0.0–40.0)

## 2024-10-28 LAB — COMPREHENSIVE METABOLIC PANEL WITH GFR
ALT: 16 U/L (ref 3–53)
AST: 18 U/L (ref 5–37)
Albumin: 3.9 g/dL (ref 3.5–5.2)
Alkaline Phosphatase: 87 U/L (ref 39–117)
BUN: 21 mg/dL (ref 6–23)
CO2: 28 meq/L (ref 19–32)
Calcium: 8.7 mg/dL (ref 8.4–10.5)
Chloride: 105 meq/L (ref 96–112)
Creatinine, Ser: 1.26 mg/dL (ref 0.40–1.50)
GFR: 56 mL/min — ABNORMAL LOW
Glucose, Bld: 119 mg/dL — ABNORMAL HIGH (ref 70–99)
Potassium: 4.2 meq/L (ref 3.5–5.1)
Sodium: 141 meq/L (ref 135–145)
Total Bilirubin: 0.6 mg/dL (ref 0.2–1.2)
Total Protein: 6.2 g/dL (ref 6.0–8.3)

## 2024-10-28 LAB — POCT GLYCOSYLATED HEMOGLOBIN (HGB A1C): Hemoglobin A1C: 6.7 % — AB (ref 4.0–5.6)

## 2024-10-28 LAB — PSA, MEDICARE: PSA: 2.86 ng/mL (ref 0.10–4.00)

## 2024-10-28 NOTE — Patient Instructions (Signed)
 A1C today stable at 6.7%.  Monitor blood pressure and be in touch if consistently > 140/90.

## 2024-10-28 NOTE — Progress Notes (Signed)
 "  Established Patient Office Visit  Subjective   Patient ID: Richard Dalton, male    DOB: Feb 16, 1951  Age: 74 y.o. MRN: [id number]  No chief complaint on file.   HPI    Hancel is here for routine medical follow-up.  He has chronic kidney disease stage IIIa and is followed by nephrology.  He sees podiatrist regularly.  Last A1c 6.5%.  Recently acquired continuous glucose monitor and average readings 145 since starting that.  On multidrug regimen for diabetes.  Also acquired recent Kardia monitor.  No atrial fibrillation.  No recent chest pains.  Blood pressure up today but generally well-controlled.  He states he rushed around to get here.  Blood pressure recently at home 127/66.  Today's reading is atypical for him.  Medications reviewed and compliant with all.  No specific medication side effects.  He sees his nephrologist in March.  They have been checking urine microalbumin creatinine ratio yearly he plans to get that through their office.  Past Medical History:  Diagnosis Date   ALLERGIC RHINITIS 11/12/2006   Allergy    Anemia    Asthma 2009   patient denies this dx   Chronic kidney disease    COPD (chronic obstructive pulmonary disease) (HCC)    DIABETES MELLITUS, TYPE II 08/07/2006   ECZEMA 11/08/2008   ERECTILE DYSFUNCTION 11/12/2006   GENERALIZED VACCINIA AS COMP MEDICAL CARE NEC 09/09/2010   GERD (gastroesophageal reflux disease)    Heart murmur    HYPERLIPIDEMIA 08/07/2006   HYPERTENSION 08/07/2006   Neoplasm of uncertain behavior of skin 02/07/2009   OSTEOARTHRITIS 08/07/2006   hands   PLANTAR FASCIITIS 11/12/2006   Sleep apnea    Past Surgical History:  Procedure Laterality Date   COLONOSCOPY  08/2015   Perry-polyps   KNEE ARTHROSCOPY     right   TOE SURGERY Right 09/22/2022   TOENAIL EXCISION  10/2019   TONSILLECTOMY AND ADENOIDECTOMY  1955   WISDOM TOOTH EXTRACTION      reports that he quit smoking about 37 years ago. His smoking use included cigarettes.  He started smoking about 57 years ago. He has a 40 pack-year smoking history. He has never used smokeless tobacco. He reports current alcohol use of about 2.0 standard drinks of alcohol per week. He reports that he does not use drugs. family history includes Alzheimer's disease in his father; Diabetes in his brother, daughter, mother, and sister. Allergies[1]  Review of Systems  Constitutional:  Negative for malaise/fatigue.  Eyes:  Negative for blurred vision.  Respiratory:  Negative for shortness of breath.   Cardiovascular:  Negative for chest pain.  Neurological:  Negative for dizziness, weakness and headaches.      Objective:     BP (!) 142/60 (BP Location: Right Arm, Cuff Size: Normal)   Pulse 96   Temp 97.7 F (36.5 C) (Oral)   Wt 212 lb 4.8 oz (96.3 kg)   SpO2 96%   BMI 31.35 kg/m  BP Readings from Last 3 Encounters:  10/28/24 (!) 142/60  07/27/24 136/84  04/27/24 132/68   Wt Readings from Last 3 Encounters:  10/28/24 212 lb 4.8 oz (96.3 kg)  07/27/24 208 lb (94.3 kg)  04/27/24 207 lb 11.2 oz (94.2 kg)      Physical Exam Vitals reviewed.  Constitutional:      General: He is not in acute distress.    Appearance: He is well-developed. He is not ill-appearing.  Eyes:     Pupils: Pupils are equal, round,  and reactive to light.  Neck:     Thyroid: No thyromegaly.  Cardiovascular:     Rate and Rhythm: Normal rate and regular rhythm.  Pulmonary:     Effort: Pulmonary effort is normal. No respiratory distress.     Breath sounds: Normal breath sounds. No wheezing or rales.  Musculoskeletal:     Cervical back: Neck supple.  Neurological:     Mental Status: He is alert and oriented to person, place, and time.      Results for orders placed or performed in visit on 10/28/24  POC HgB A1c  Result Value Ref Range   Hemoglobin A1C 6.7 (A) 4.0 - 5.6 %   HbA1c POC (<> result, manual entry)     HbA1c, POC (prediabetic range)     HbA1c, POC (controlled diabetic  range)      Last CBC Lab Results  Component Value Date   WBC 5.6 11/20/2017   HGB 16.0 12/15/2022   HCT 45.8 11/20/2017   MCV 94.2 11/20/2017   RDW 14.2 11/20/2017   PLT 231.0 11/20/2017   Last metabolic panel Lab Results  Component Value Date   GLUCOSE 170 (H) 10/30/2023   NA 139 10/30/2023   K 4.0 10/30/2023   CL 101 10/30/2023   CO2 30 10/30/2023   BUN 26 (H) 10/30/2023   CREATININE 1.52 (H) 10/30/2023   GFR 45.02 (L) 10/30/2023   CALCIUM  8.8 10/30/2023   PROT 6.6 10/30/2023   ALBUMIN 3.8 10/30/2023   BILITOT 0.7 10/30/2023   ALKPHOS 82 10/30/2023   AST 20 10/30/2023   ALT 21 10/30/2023   Last lipids Lab Results  Component Value Date   CHOL 143 10/30/2023   HDL 46.60 10/30/2023   LDLCALC 60 10/30/2023   LDLDIRECT 80.0 11/15/2019   TRIG 178.0 (H) 10/30/2023   CHOLHDL 3 10/30/2023   Last hemoglobin A1c Lab Results  Component Value Date   HGBA1C 6.7 (A) 10/28/2024      The 10-year ASCVD risk score (Arnett DK, et al., 2019) is: 48%    Assessment & Plan:   Problem List Items Addressed This Visit       Unprioritized   CKD (chronic kidney disease) stage 3, GFR 30-59 ml/min (HCC)   Relevant Orders   CBC with Differential/Platelet   Type 2 diabetes mellitus with hyperglycemia (HCC) - Primary   Relevant Orders   POC HgB A1c (Completed)   Hyperlipidemia   Relevant Orders   Lipid panel   CMP   Essential hypertension   Other Visit Diagnoses       Prostate cancer (HCC)       Relevant Orders   PSA, Medicare     Patient seen for follow-up regarding multiple chronic medical problems as above.  A1c today stable 6.7%.  Continue current regimen.  Continue lower glycemic diet.  Monitor blood pressure regularly at home and be in touch if consistently greater than 140 systolic or 90 diastolic.  Patient requesting PSA.  We discussed briefly some of the pros and cons regarding PSA screening after 70-both risk of false positive and false negative.  Obtain  labs as above.  We did not add urine microalbumin creatinine ratio since he plans to get this through his nephrologist in March.  Routine medical follow-up in 6 months and sooner as needed  Return in about 6 months (around 04/27/2025).    Wolm Scarlet, MD     [1]  Allergies Allergen Reactions   Tetracycline     REACTION:  rash   "

## 2024-10-31 ENCOUNTER — Ambulatory Visit: Payer: Self-pay | Admitting: Family Medicine

## 2024-12-14 ENCOUNTER — Ambulatory Visit: Admitting: Podiatry

## 2025-04-03 ENCOUNTER — Ambulatory Visit

## 2025-04-28 ENCOUNTER — Ambulatory Visit: Admitting: Family Medicine
